# Patient Record
Sex: Female | Born: 1950 | Race: White | Hispanic: No | Marital: Married | Smoking: Never smoker
Health system: Southern US, Community
[De-identification: ages and names within clinical notes are randomized; demographics above are authoritative.]

## PROBLEM LIST (undated history)

## (undated) DIAGNOSIS — G894 Chronic pain syndrome: Secondary | ICD-10-CM

## (undated) DIAGNOSIS — M4800 Spinal stenosis, site unspecified: Secondary | ICD-10-CM

## (undated) DIAGNOSIS — M545 Low back pain, unspecified: Secondary | ICD-10-CM

## (undated) DIAGNOSIS — I10 Essential (primary) hypertension: Secondary | ICD-10-CM

## (undated) DIAGNOSIS — E785 Hyperlipidemia, unspecified: Secondary | ICD-10-CM

## (undated) HISTORY — DX: Hyperlipidemia, unspecified: E78.5

## (undated) HISTORY — DX: Chronic pain syndrome: G89.4

## (undated) HISTORY — DX: Low back pain: M54.5

## (undated) HISTORY — DX: Spinal stenosis, site unspecified: M48.00

## (undated) HISTORY — DX: Low back pain, unspecified: M54.50

## (undated) HISTORY — DX: Essential (primary) hypertension: I10

## (undated) HISTORY — PX: TUBAL LIGATION: SHX77

---

## 2004-12-08 ENCOUNTER — Ambulatory Visit: Payer: Self-pay | Admitting: Internal Medicine

## 2005-07-23 ENCOUNTER — Other Ambulatory Visit: Payer: Self-pay

## 2005-07-23 ENCOUNTER — Emergency Department: Payer: Self-pay | Admitting: Emergency Medicine

## 2011-03-22 ENCOUNTER — Ambulatory Visit: Payer: Self-pay | Admitting: Nephrology

## 2012-04-03 ENCOUNTER — Ambulatory Visit: Payer: Self-pay | Admitting: Family Medicine

## 2012-10-18 ENCOUNTER — Ambulatory Visit: Payer: Self-pay

## 2013-01-20 ENCOUNTER — Ambulatory Visit: Payer: Self-pay

## 2013-03-11 DIAGNOSIS — M5126 Other intervertebral disc displacement, lumbar region: Secondary | ICD-10-CM | POA: Insufficient documentation

## 2013-05-08 ENCOUNTER — Ambulatory Visit: Payer: Self-pay

## 2013-12-25 ENCOUNTER — Ambulatory Visit: Payer: Self-pay | Admitting: Unknown Physician Specialty

## 2014-04-16 ENCOUNTER — Ambulatory Visit: Payer: Self-pay

## 2016-07-14 LAB — HM DIABETES EYE EXAM

## 2016-08-30 DIAGNOSIS — E782 Mixed hyperlipidemia: Secondary | ICD-10-CM

## 2016-08-30 DIAGNOSIS — M4800 Spinal stenosis, site unspecified: Secondary | ICD-10-CM

## 2016-08-30 DIAGNOSIS — I10 Essential (primary) hypertension: Secondary | ICD-10-CM | POA: Insufficient documentation

## 2016-08-30 DIAGNOSIS — M545 Low back pain, unspecified: Secondary | ICD-10-CM | POA: Insufficient documentation

## 2016-08-30 DIAGNOSIS — E785 Hyperlipidemia, unspecified: Secondary | ICD-10-CM | POA: Insufficient documentation

## 2016-08-30 DIAGNOSIS — G894 Chronic pain syndrome: Secondary | ICD-10-CM | POA: Insufficient documentation

## 2016-09-12 ENCOUNTER — Ambulatory Visit (INDEPENDENT_AMBULATORY_CARE_PROVIDER_SITE_OTHER): Payer: Managed Care, Other (non HMO) | Admitting: Unknown Physician Specialty

## 2016-09-12 ENCOUNTER — Encounter: Payer: Self-pay | Admitting: Unknown Physician Specialty

## 2016-09-12 VITALS — BP 123/86 | HR 70 | Temp 98.3°F | Ht 63.7 in | Wt 228.6 lb

## 2016-09-12 DIAGNOSIS — Z23 Encounter for immunization: Secondary | ICD-10-CM | POA: Diagnosis not present

## 2016-09-12 DIAGNOSIS — E782 Mixed hyperlipidemia: Secondary | ICD-10-CM | POA: Diagnosis not present

## 2016-09-12 DIAGNOSIS — E6609 Other obesity due to excess calories: Secondary | ICD-10-CM | POA: Diagnosis not present

## 2016-09-12 DIAGNOSIS — M545 Low back pain: Secondary | ICD-10-CM

## 2016-09-12 DIAGNOSIS — Z0000 Encounter for general adult medical examination without abnormal findings: Secondary | ICD-10-CM

## 2016-09-12 DIAGNOSIS — Z6839 Body mass index (BMI) 39.0-39.9, adult: Secondary | ICD-10-CM | POA: Diagnosis not present

## 2016-09-12 DIAGNOSIS — R7301 Impaired fasting glucose: Secondary | ICD-10-CM

## 2016-09-12 DIAGNOSIS — I10 Essential (primary) hypertension: Secondary | ICD-10-CM

## 2016-09-12 DIAGNOSIS — M4800 Spinal stenosis, site unspecified: Secondary | ICD-10-CM | POA: Diagnosis not present

## 2016-09-12 LAB — BAYER DCA HB A1C WAIVED: HB A1C (BAYER DCA - WAIVED): 6 % (ref <7.0)

## 2016-09-12 MED ORDER — CYCLOBENZAPRINE HCL 10 MG PO TABS: 10.0000 mg | ORAL_TABLET | Freq: Three times a day (TID) | ORAL | 3 refills | Status: DC | PRN

## 2016-09-12 MED ORDER — ATORVASTATIN CALCIUM 20 MG PO TABS: 20.0000 mg | ORAL_TABLET | Freq: Every day | ORAL | 1 refills | Status: DC

## 2016-09-12 MED ORDER — MELOXICAM 15 MG PO TABS: 15.0000 mg | ORAL_TABLET | Freq: Every day | ORAL | 2 refills | Status: DC

## 2016-09-12 MED ORDER — GABAPENTIN 300 MG PO CAPS: 300.0000 mg | ORAL_CAPSULE | Freq: Three times a day (TID) | ORAL | 2 refills | Status: DC

## 2016-09-12 NOTE — Assessment & Plan Note (Addendum)
Stable, continue present medications.  Encourage to start exercising

## 2016-09-12 NOTE — Assessment & Plan Note (Signed)
Discussed with pt to work on diet low on sugar and processed food and exercise.

## 2016-09-12 NOTE — Patient Instructions (Addendum)
Influenza (Flu) Vaccine (Inactivated or Recombinant): What You Need to Know 1. Why get vaccinated? Influenza ("flu") is a contagious disease that spreads around the [place] every year, usually between October and May. Flu is caused by influenza viruses, and is spread mainly by coughing, sneezing, and close contact. Anyone can get flu. Flu strikes suddenly and can last several days. Symptoms vary by age, but can include:  fever/chills  sore throat  muscle aches  fatigue  cough  headache  runny or stuffy nose Flu can also lead to pneumonia and blood infections, and cause diarrhea and seizures in children. If you have a medical condition, such as heart or lung disease, flu can make it worse. Flu is more dangerous for some people. Infants and young children, people 65 years of age and older, pregnant women, and people with certain health conditions or a weakened immune system are at greatest risk. Each year thousands of people in the [place] die from flu, and many more are hospitalized. Flu vaccine can:  keep you from getting flu,  make flu less severe if you do get it, and  keep you from spreading flu to your family and other people. 2. Inactivated and recombinant flu vaccines A dose of flu vaccine is recommended every flu season. Children 6 months through 8 years of age may need two doses during the same flu season. Everyone else needs only one dose each flu season. Some inactivated flu vaccines contain a very small amount of a mercury-based preservative called thimerosal. Studies have not shown thimerosal in vaccines to be harmful, but flu vaccines that do not contain thimerosal are available. There is no live flu virus in flu shots. They cannot cause the flu. There are many flu viruses, and they are always changing. Each year a new flu vaccine is made to protect against three or four viruses that are likely to cause disease in the upcoming flu season. But even when the  vaccine doesn't exactly match these viruses, it may still provide some protection. Flu vaccine cannot prevent:  flu that is caused by a virus not covered by the vaccine, or  illnesses that look like flu but are not. It takes about 2 weeks for protection to develop after vaccination, and protection lasts through the flu season. 3. Some people should not get this vaccine Tell the person who is giving you the vaccine:  If you have any severe, life-threatening allergies. If you ever had a life-threatening allergic reaction after a dose of flu vaccine, or have a severe allergy to any part of this vaccine, you may be advised not to get vaccinated. Most, but not all, types of flu vaccine contain a small amount of egg protein.  If you ever had Guillain-Barr Syndrome (also called GBS). Some people with a history of GBS should not get this vaccine. This should be discussed with your doctor.  If you are not feeling well. It is usually okay to get flu vaccine when you have a mild illness, but you might be asked to come back when you feel better. 4. Risks of a vaccine reaction With any medicine, including vaccines, there is a chance of reactions. These are usually mild and go away on their own, but serious reactions are also possible. Most people who get a flu shot do not have any problems with it. Minor problems following a flu shot include:  soreness, redness, or swelling where the shot was given  hoarseness  sore, red or itchy   eyes  cough  fever  aches  headache  itching  fatigue If these problems occur, they usually begin soon after the shot and last 1 or 2 days. More serious problems following a flu shot can include the following:  There may be a small increased risk of Guillain-Barre Syndrome (GBS) after inactivated flu vaccine. This risk has been estimated at 1 or 2 additional cases per million people vaccinated. This is much lower than the risk of severe complications from flu,  which can be prevented by flu vaccine.  Young children who get the flu shot along with pneumococcal vaccine (PCV13) and/or DTaP vaccine at the same time might be slightly more likely to have a seizure caused by fever. Ask your doctor for more information. Tell your doctor if a child who is getting flu vaccine has ever had a seizure. Problems that could happen after any injected vaccine:  People sometimes faint after a medical procedure, including vaccination. Sitting or lying down for about 15 minutes can help prevent fainting, and injuries caused by a fall. Tell your doctor if you feel dizzy, or have vision changes or ringing in the ears.  Some people get severe pain in the shoulder and have difficulty moving the arm where a shot was given. This happens very rarely.  Any medication can cause a severe allergic reaction. Such reactions from a vaccine are very rare, estimated at about 1 in a million doses, and would happen within a few minutes to a few hours after the vaccination. As with any medicine, there is a very remote chance of a vaccine causing a serious injury or death. The safety of vaccines is always being monitored. For more information, visit: www.cdc.gov/vaccinesafety/ 5. What if there is a serious reaction? What should I look for? Look for anything that concerns you, such as signs of a severe allergic reaction, very high fever, or unusual behavior. Signs of a severe allergic reaction can include hives, swelling of the face and throat, difficulty breathing, a fast heartbeat, dizziness, and weakness. These would start a few minutes to a few hours after the vaccination. What should I do?  If you think it is a severe allergic reaction or other emergency that can't wait, call 9-1-1 and get the person to the nearest hospital. Otherwise, call your doctor.  Reactions should be reported to the Vaccine Adverse Event Reporting System (VAERS). Your doctor should file this report, or you can do  it yourself through the VAERS web site at www.vaers.hhs.gov, or by calling 1-800-822-7967.  VAERS does not give medical advice. 6. The National Vaccine Injury Compensation Program The National Vaccine Injury Compensation Program (VICP) is a federal program that was created to compensate people who may have been injured by certain vaccines. Persons who believe they may have been injured by a vaccine can learn about the program and about filing a claim by calling 1-800-338-2382 or visiting the VICP website at www.hrsa.gov/vaccinecompensation. There is a time limit to file a claim for compensation. 7. How can I learn more?  Ask your healthcare provider. He or she can give you the vaccine package insert or suggest other sources of information.  Call your local or state health department.  Contact the Centers for Disease Control and Prevention (CDC):  Call 1-800-232-4636 (1-800-CDC-INFO) or  Visit CDC's website at www.cdc.gov/flu Vaccine Information Statement, Inactivated Influenza Vaccine (05/01/2014) This information is not intended to replace advice given to you by your health care provider. Make sure you discuss any questions you   you discuss any questions you have with your health care provider. Document Released: 07/06/2006 Document Revised: 06/01/2016 Document Reviewed: 06/01/2016 Elsevier Interactive Patient Education  2017 [employer] Tdap Vaccine (Tetanus, Diphtheria and Pertussis): What You Need to Know 1. Why get vaccinated? Tetanus, diphtheria and pertussis are very serious diseases. Tdap vaccine can protect us from these diseases. And, Tdap vaccine given to pregnant women can protect newborn babies against pertussis. TETANUS (Lockjaw) is rare in the [place] today. It causes painful muscle tightening and stiffness, usually all over the body.  It can lead to tightening of muscles in the head and neck so you can't open your mouth, swallow, or sometimes even breathe. Tetanus kills about 1 out of 10  people who are infected even after receiving the best medical care.  DIPHTHERIA is also rare in the [place] today. It can cause a thick coating to form in the back of the throat.  It can lead to breathing problems, heart failure, paralysis, and death.  PERTUSSIS (Whooping Cough) causes severe coughing spells, which can cause difficulty breathing, vomiting and disturbed sleep.  It can also lead to weight loss, incontinence, and rib fractures. Up to 2 in 100 adolescents and 5 in 100 adults with pertussis are hospitalized or have complications, which could include pneumonia or death.  These diseases are caused by bacteria. Diphtheria and pertussis are spread from person to person through secretions from coughing or sneezing. Tetanus enters the body through cuts, scratches, or wounds. Before vaccines, as many as 200,000 cases of diphtheria, 200,000 cases of pertussis, and hundreds of cases of tetanus, were reported in the [place] each year. Since vaccination began, reports of cases for tetanus and diphtheria have dropped by about 99% and for pertussis by about 80%. 2. Tdap vaccine Tdap vaccine can protect adolescents and adults from tetanus, diphtheria, and pertussis. One dose of Tdap is routinely given at age 11 or 12. People who did not get Tdap at that age should get it as soon as possible. Tdap is especially important for healthcare professionals and anyone having close contact with a baby younger than 12 months. Pregnant women should get a dose of Tdap during every pregnancy, to protect the newborn from pertussis. Infants are most at risk for severe, life-threatening complications from pertussis. Another vaccine, called Td, protects against tetanus and diphtheria, but not pertussis. A Td booster should be given every 10 years. Tdap may be given as one of these boosters if you have never gotten Tdap before. Tdap may also be given after a severe cut or burn to prevent tetanus  infection. Your doctor or the person giving you the vaccine can give you more information. Tdap may safely be given at the same time as other vaccines. 3. Some people should not get this vaccine  A person who has ever had a life-threatening allergic reaction after a previous dose of any diphtheria, tetanus or pertussis containing vaccine, OR has a severe allergy to any part of this vaccine, should not get Tdap vaccine. Tell the person giving the vaccine about any severe allergies.  Anyone who had coma or long repeated seizures within 7 days after a childhood dose of DTP or DTaP, or a previous dose of Tdap, should not get Tdap, unless a cause other than the vaccine was found. They can still get Td.  Talk to your doctor if you: ? have seizures or another nervous system problem, ? had severe pain or swelling after any vaccine containing diphtheria,   tetanus or pertussis, ? ever had a condition called Guillain-Barr Syndrome (GBS), ? aren't feeling well on the day the shot is scheduled. 4. Risks With any medicine, including vaccines, there is a chance of side effects. These are usually mild and go away on their own. Serious reactions are also possible but are rare. Most people who get Tdap vaccine do not have any problems with it. Mild problems following Tdap: (Did not interfere with activities)  Pain where the shot was given (about 3 in 4 adolescents or 2 in 3 adults)  Redness or swelling where the shot was given (about 1 person in 5)  Mild fever of at least 100.4F (up to about 1 in 25 adolescents or 1 in 100 adults)  Headache (about 3 or 4 people in 10)  Tiredness (about 1 person in 3 or 4)  Nausea, vomiting, diarrhea, stomach ache (up to 1 in 4 adolescents or 1 in 10 adults)  Chills, sore joints (about 1 person in 10)  Body aches (about 1 person in 3 or 4)  Rash, swollen glands (uncommon)  Moderate problems following Tdap: (Interfered with activities, but did not require medical  attention)  Pain where the shot was given (up to 1 in 5 or 6)  Redness or swelling where the shot was given (up to about 1 in 16 adolescents or 1 in 12 adults)  Fever over 102F (about 1 in 100 adolescents or 1 in 250 adults)  Headache (about 1 in 7 adolescents or 1 in 10 adults)  Nausea, vomiting, diarrhea, stomach ache (up to 1 or 3 people in 100)  Swelling of the entire arm where the shot was given (up to about 1 in 500).  Severe problems following Tdap: (Unable to perform usual activities; required medical attention)  Swelling, severe pain, bleeding and redness in the arm where the shot was given (rare).  Problems that could happen after any vaccine:  People sometimes faint after a medical procedure, including vaccination. Sitting or lying down for about 15 minutes can help prevent fainting, and injuries caused by a fall. Tell your doctor if you feel dizzy, or have vision changes or ringing in the ears.  Some people get severe pain in the shoulder and have difficulty moving the arm where a shot was given. This happens very rarely.  Any medication can cause a severe allergic reaction. Such reactions from a vaccine are very rare, estimated at fewer than 1 in a million doses, and would happen within a few minutes to a few hours after the vaccination. As with any medicine, there is a very remote chance of a vaccine causing a serious injury or death. The safety of vaccines is always being monitored. For more information, visit: www.cdc.gov/vaccinesafety/ 5. What if there is a serious problem? What should I look for? Look for anything that concerns you, such as signs of a severe allergic reaction, very high fever, or unusual behavior. Signs of a severe allergic reaction can include hives, swelling of the face and throat, difficulty breathing, a fast heartbeat, dizziness, and weakness. These would usually start a few minutes to a few hours after the vaccination. What should I do?  If  you think it is a severe allergic reaction or other emergency that can't wait, call 9-1-1 or get the person to the nearest hospital. Otherwise, call your doctor.  Afterward, the reaction should be reported to the Vaccine Adverse Event Reporting System (VAERS). Your doctor might file this report, or you can do   calling [phone].  VAERS does not give medical advice. 6. The National Vaccine Injury Compensation Program The [employer] Vaccine Injury Compensation Program (VICP) is a federal program that was created to compensate people who may have been injured by certain vaccines. Persons who believe they may have been injured by a vaccine can learn about the program and about filing a claim by calling 918 150 5647 or visiting the [place] website at GoldCloset.com.ee. There is a time limit to file a claim for compensation. 7. How can I learn more?  Ask your doctor. He or she can give you the vaccine package insert or suggest other sources of information.  Call your local or state health department.  Contact the Centers for Disease Control and Prevention (CDC):  Call (769)704-7908 (1-800-CDC-INFO) or  Visit CDC's website at http://hunter.com/ CDC Tdap Vaccine VIS (11/18/13) This information is not intended to replace advice given to you by your health care provider. Make sure you discuss any questions you have with your health ca ------------------------------------------------------------------------------  Please do call to schedule your mammogram; the number to schedule one at either [facility] or [facility] is [phone]

## 2016-09-12 NOTE — Progress Notes (Signed)
BP 123/86 (BP Location: Left Arm, Patient Position: Sitting, Cuff Size: Large)   Pulse 70   Temp 98.3 F (36.8 C)   Ht 5' 3.7" (1.618 m)   Wt 228 lb 9.6 oz (103.7 kg)   LMP  (LMP Unknown)   SpO2 91%   BMI 39.61 kg/m    Subjective:    Patient ID: Kiara Parker, female    DOB: 1951/01/21, 65 y.o.   MRN: [id number]  HPI: Kiara Parker is a 65 y.o. female  Chief Complaint  Patient presents with  . Diabetes    pt states she would like to be checked for diabetes, states she has been borderline in the past  . Orders    pt states she would like to have an order for a mammogram   . 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    Hyperlipidemia Takes Lipitor on occasion.  Had a stress test through cardiology Add to muscle aches she already has Diet compliance/Exercise: Not good at the moment.    Spinal Stenosis States she has low back pain that is worse with standing.  Describes an aching pain.  Takes Cyclobenzaprine about 4 times/week, Gabapentin once or twice a day, and Meloxicam during a flare.  She has seen a neurosurgery.  Diabetes Concern with diabetes as borderline in the past.  States she is having some feet swelling and tingling in her feet.  "I have a terrible sweet tooth."  She has gained more weight in the last several years.    Relevant past medical, surgical, family and social history reviewed and updated as indicated. Interim medical history since our last visit reviewed. Allergies and medications reviewed and updated.  Review of Systems  Per HPI unless specifically indicated above     Objective:    BP 123/86 (BP Location: Left Arm, Patient Position: Sitting, Cuff Size: Large)   Pulse 70   Temp 98.3 F (36.8 C)   Ht 5' 3.7" (1.618 m)   Wt 228 lb 9.6 oz (103.7 kg)    LMP  (LMP Unknown)   SpO2 91%   BMI 39.61 kg/m   Wt Readings from Last 3 Encounters:  09/12/16 228 lb 9.6 oz (103.7 kg)  03/25/14 215 lb (97.5 kg)    Physical Exam  Constitutional: She is oriented to person, place, and time. She appears well-developed and well-nourished. No distress.  HENT:  Head: Normocephalic and atraumatic.  Eyes: Conjunctivae and lids are normal. Right eye exhibits no discharge. Left eye exhibits no discharge. No scleral icterus.  Neck: Normal range of motion. Neck supple. No JVD present. Carotid bruit is not present.  Cardiovascular: Normal rate, regular rhythm and normal heart sounds.   Pulmonary/Chest: Effort normal and breath sounds normal. Right breast exhibits no inverted nipple, no mass, no nipple discharge, no skin change and no tenderness. Left breast exhibits no inverted nipple, no mass, no nipple discharge, no skin change and no tenderness. Breasts are symmetrical.  Abdominal: Normal appearance. There is no splenomegaly or hepatomegaly.  Musculoskeletal: Normal range of motion.  Neurological: She is alert and oriented to person, place, and time.  Skin: Skin is warm, dry and intact. No rash noted. No pallor.  Psychiatric: She has a normal mood and affect. Her behavior is normal. Judgment  and thought content normal.    Results for orders placed or performed in visit on 07/21/16  HM DIABETES EYE EXAM  Result Value Ref Range   HM Diabetic Eye Exam No Retinopathy No Retinopathy      Assessment & Plan:   Problem List Items Addressed This Visit      Unprioritized   Hyperlipidemia   Relevant Medications   atorvastatin (LIPITOR) 20 MG tablet   Other Relevant Orders   Comprehensive metabolic panel   Lipid Panel w/o Chol/HDL Ratio   Hypertension   Relevant Medications   atorvastatin (LIPITOR) 20 MG tablet   IFG (impaired fasting glucose)    Discussed with pt to work on diet low on sugar and processed food and exercise.        Relevant Orders    Bayer DCA Hb A1c Waived   Lumbago   Relevant Medications   cyclobenzaprine (FLEXERIL) 10 MG tablet   meloxicam (MOBIC) 15 MG tablet   Spinal stenosis    Stable, continue present medications.  Encourage to start exercising        Other Visit Diagnoses    Need for influenza vaccination    -  Primary   Relevant Orders   Flu Vaccine QUAD 36+ mos IM (Completed)   Need for diphtheria-tetanus-pertussis (Tdap) vaccine, adult/adolescent       Relevant Orders   Tdap vaccine greater than or equal to 7yo IM (Completed)   Routine general medical examination at a health care facility       Relevant Orders   MM DIGITAL SCREENING BILATERAL   Comprehensive metabolic panel   TSH   CBC with Differential/Platelet   Class 2 obesity due to excess calories without serious comorbidity with body mass index (BMI) of 39.0 to 39.9 in adult       Relevant Orders   Bayer DCA Hb A1c Waived      Note pt is fasting this AM  Follow up plan: Return in about 6 months (around 03/13/2017) for physical.

## 2016-09-13 ENCOUNTER — Encounter: Payer: Self-pay | Admitting: Unknown Physician Specialty

## 2016-09-13 LAB — CBC WITH DIFFERENTIAL/PLATELET
Basophils Absolute: 0 10*3/uL (ref 0.0–0.2)
Basos: 0 %
EOS (ABSOLUTE): 0.2 10*3/uL (ref 0.0–0.4)
Eos: 4 %
Hematocrit: 41.3 % (ref 34.0–46.6)
Hemoglobin: 13.8 g/dL (ref 11.1–15.9)
Immature Grans (Abs): 0 10*3/uL (ref 0.0–0.1)
Immature Granulocytes: 0 %
Lymphocytes Absolute: 1.8 10*3/uL (ref 0.7–3.1)
Lymphs: 32 %
MCH: 29.7 pg (ref 26.6–33.0)
MCHC: 33.4 g/dL (ref 31.5–35.7)
MCV: 89 fL (ref 79–97)
Monocytes Absolute: 0.4 10*3/uL (ref 0.1–0.9)
Monocytes: 7 %
Neutrophils Absolute: 3.3 10*3/uL (ref 1.4–7.0)
Neutrophils: 57 %
Platelets: 277 10*3/uL (ref 150–379)
RBC: 4.65 x10E6/uL (ref 3.77–5.28)
RDW: 13.3 % (ref 12.3–15.4)
WBC: 5.8 10*3/uL (ref 3.4–10.8)

## 2016-09-13 LAB — COMPREHENSIVE METABOLIC PANEL
ALT: 35 IU/L — ABNORMAL HIGH (ref 0–32)
AST: 28 IU/L (ref 0–40)
Albumin/Globulin Ratio: 1.5 (ref 1.2–2.2)
Albumin: 3.8 g/dL (ref 3.6–4.8)
Alkaline Phosphatase: 73 IU/L (ref 39–117)
BUN/Creatinine Ratio: 9 — ABNORMAL LOW (ref 12–28)
BUN: 8 mg/dL (ref 8–27)
Bilirubin Total: 0.5 mg/dL (ref 0.0–1.2)
CO2: 24 mmol/L (ref 18–29)
Calcium: 8.9 mg/dL (ref 8.7–10.3)
Chloride: 102 mmol/L (ref 96–106)
Creatinine, Ser: 0.86 mg/dL (ref 0.57–1.00)
GFR calc Af Amer: 83 mL/min/{1.73_m2} (ref >59)
GFR calc non Af Amer: 72 mL/min/{1.73_m2} (ref >59)
Globulin, Total: 2.6 g/dL (ref 1.5–4.5)
Glucose: 102 mg/dL — ABNORMAL HIGH (ref 65–99)
Potassium: 4.6 mmol/L (ref 3.5–5.2)
Sodium: 141 mmol/L (ref 134–144)
Total Protein: 6.4 g/dL (ref 6.0–8.5)

## 2016-09-13 LAB — TSH: TSH: 0.924 u[IU]/mL (ref 0.450–4.500)

## 2016-09-13 LAB — LIPID PANEL W/O CHOL/HDL RATIO
Cholesterol, Total: 207 mg/dL — ABNORMAL HIGH (ref 100–199)
HDL: 49 mg/dL (ref >39)
LDL Calculated: 130 mg/dL — ABNORMAL HIGH (ref 0–99)
Triglycerides: 140 mg/dL (ref 0–149)
VLDL Cholesterol Cal: 28 mg/dL (ref 5–40)

## 2016-10-13 ENCOUNTER — Ambulatory Visit
Admission: RE | Admit: 2016-10-13 | Discharge: 2016-10-13 | Disposition: A | Discharge status: Home / Self Care | Payer: Commercial Managed Care - PPO | Source: Ambulatory Visit | Attending: Unknown Physician Specialty | Admitting: Unknown Physician Specialty

## 2016-10-13 DIAGNOSIS — Z1231 Encounter for screening mammogram for malignant neoplasm of breast: Secondary | ICD-10-CM | POA: Diagnosis not present

## 2016-10-13 DIAGNOSIS — Z0000 Encounter for general adult medical examination without abnormal findings: Secondary | ICD-10-CM

## 2016-11-13 ENCOUNTER — Emergency Department
Admission: EM | Admit: 2016-11-13 | Discharge: 2016-11-13 | Disposition: A | Discharge status: Home / Self Care | Payer: Commercial Managed Care - PPO | Attending: Emergency Medicine | Admitting: Emergency Medicine

## 2016-11-13 ENCOUNTER — Encounter: Payer: Self-pay | Admitting: Emergency Medicine

## 2016-11-13 DIAGNOSIS — S199XXA Unspecified injury of neck, initial encounter: Secondary | ICD-10-CM | POA: Diagnosis present

## 2016-11-13 DIAGNOSIS — M7918 Myalgia, other site: Secondary | ICD-10-CM

## 2016-11-13 DIAGNOSIS — R51 Headache: Secondary | ICD-10-CM | POA: Insufficient documentation

## 2016-11-13 DIAGNOSIS — M542 Cervicalgia: Secondary | ICD-10-CM | POA: Diagnosis not present

## 2016-11-13 DIAGNOSIS — Y9389 Activity, other specified: Secondary | ICD-10-CM | POA: Insufficient documentation

## 2016-11-13 DIAGNOSIS — M25512 Pain in left shoulder: Secondary | ICD-10-CM | POA: Insufficient documentation

## 2016-11-13 DIAGNOSIS — Y92410 Unspecified street and highway as the place of occurrence of the external cause: Secondary | ICD-10-CM | POA: Insufficient documentation

## 2016-11-13 DIAGNOSIS — M25511 Pain in right shoulder: Secondary | ICD-10-CM | POA: Diagnosis not present

## 2016-11-13 DIAGNOSIS — Z79899 Other long term (current) drug therapy: Secondary | ICD-10-CM | POA: Diagnosis not present

## 2016-11-13 DIAGNOSIS — I10 Essential (primary) hypertension: Secondary | ICD-10-CM | POA: Diagnosis not present

## 2016-11-13 DIAGNOSIS — G8929 Other chronic pain: Secondary | ICD-10-CM | POA: Diagnosis not present

## 2016-11-13 DIAGNOSIS — Y999 Unspecified external cause status: Secondary | ICD-10-CM | POA: Diagnosis not present

## 2016-11-13 MED ORDER — HYDROCODONE-ACETAMINOPHEN 5-325 MG PO TABS
1.0000 | ORAL_TABLET | Freq: Once | ORAL | Status: AC
  Administered 2016-11-13: 1 via ORAL
  Filled 2016-11-13: qty 1

## 2016-11-13 MED ORDER — HYDROCODONE-ACETAMINOPHEN 5-325 MG PO TABS: 1.0000 | ORAL_TABLET | ORAL | 0 refills | Status: DC | PRN
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
## 2016-11-13 NOTE — Discharge Instructions (Signed)
Follow up with your primary care doctor if any continued problems or [facility] acute-care. Continue taking meloxicam as directed by your doctor. Norco as needed for pain. This medication could cause drowsiness increase your risk for falling. Do not drive and take this medication. You may also use ice or heat to muscles as needed for comfort.

## 2016-12-09 ENCOUNTER — Other Ambulatory Visit: Payer: Self-pay | Admitting: Unknown Physician Specialty

## 2017-01-15 ENCOUNTER — Telehealth: Payer: Self-pay | Admitting: Unknown Physician Specialty

## 2017-01-15 NOTE — Telephone Encounter (Signed)
Patient returned my call. She asked when her last pap was and I told patient that it was 10/14/13. Patient stated that she has had some recent vaginal bleeding and states that one of her sisters recently passed away from cancer so she is very concerned. Scheduled patient an appointment to come in to see [facility] tomorrow.

## 2017-01-15 NOTE — Telephone Encounter (Signed)
Tried calling patient, phone rang a few times and then went to a busy signal. Will try to call again later. Negative PAP and negative HPV in [place] on last pap.

## 2017-01-16 ENCOUNTER — Encounter: Payer: Self-pay | Admitting: Unknown Physician Specialty

## 2017-01-16 ENCOUNTER — Ambulatory Visit (INDEPENDENT_AMBULATORY_CARE_PROVIDER_SITE_OTHER): Payer: Commercial Managed Care - PPO | Admitting: Unknown Physician Specialty

## 2017-01-16 VITALS — BP 129/81 | HR 83 | Temp 97.7°F | Wt 234.0 lb

## 2017-01-16 DIAGNOSIS — G4700 Insomnia, unspecified: Secondary | ICD-10-CM | POA: Insufficient documentation

## 2017-01-16 DIAGNOSIS — N950 Postmenopausal bleeding: Secondary | ICD-10-CM | POA: Diagnosis not present

## 2017-01-16 DIAGNOSIS — F5101 Primary insomnia: Secondary | ICD-10-CM

## 2017-01-16 MED ORDER — CYCLOBENZAPRINE HCL 10 MG PO TABS: 10.0000 mg | ORAL_TABLET | Freq: Every day | ORAL | 3 refills | Status: DC

## 2017-01-16 NOTE — Assessment & Plan Note (Signed)
Uses occasional Cyclobenzeprine.  Discussed CBT for sleep

## 2017-01-16 NOTE — Progress Notes (Signed)
BP 129/81 (BP Location: Left Arm, Cuff Size: Large)   Pulse 83   Temp 97.7 F (36.5 C)   Wt 234 lb (106.1 kg)   LMP  (LMP Unknown)   SpO2 93%   BMI 40.17 kg/m    Subjective:    Patient ID: Kiara Parker, female    DOB: 05/28/1951, 66 y.o.   MRN: [id number]  HPI: Kiara Parker is a 66 y.o. female  Chief Complaint  Patient presents with  . Vaginal Bleeding    pt states she has had some spotting twice within the last 3 weeks   . Medication Refill    pt states she would like a refill on cyclobenazeprine   Vaginal bleeding As noted above.  She has had spotting x2 in the last 3 weeks.  No cramps.  Her sister died of lymphoma and wants to have everything checked out.    Back pain Pt would like a refill of Cyclobenzeprine she uses once or twice a week.  States it is primarily to relax her and help her fall asleep.     Relevant past medical, surgical, family and social history reviewed and updated as indicated. Interim medical history since our last visit reviewed. Allergies and medications reviewed and updated.  Review of Systems  Per HPI unless specifically indicated above     Objective:    BP 129/81 (BP Location: Left Arm, Cuff Size: Large)   Pulse 83   Temp 97.7 F (36.5 C)   Wt 234 lb (106.1 kg)   LMP  (LMP Unknown)   SpO2 93%   BMI 40.17 kg/m   Wt Readings from Last 3 Encounters:  01/16/17 234 lb (106.1 kg)  11/13/16 220 lb (99.8 kg)  09/12/16 228 lb 9.6 oz (103.7 kg)    Physical Exam  Constitutional: She is oriented to person, place, and time. She appears well-developed and well-nourished. No distress.  HENT:  Head: Normocephalic and atraumatic.  Eyes: Conjunctivae and lids are normal. Right eye exhibits no discharge. Left eye exhibits no discharge. No scleral icterus.  Neck: Normal range of motion. Neck supple. No JVD present. Carotid bruit is not present.  Cardiovascular: Normal rate, regular rhythm and normal heart sounds.   Pulmonary/Chest:  Effort normal and breath sounds normal.  Abdominal: Normal appearance. There is no splenomegaly or hepatomegaly.  Musculoskeletal: Normal range of motion.  Neurological: She is alert and oriented to person, place, and time.  Skin: Skin is warm, dry and intact. No rash noted. No pallor.  Psychiatric: She has a normal mood and affect. Her behavior is normal. Judgment and thought content normal.    Results for orders placed or performed in visit on 09/12/16  Comprehensive metabolic panel  Result Value Ref Range   Glucose 102 (H) 65 - 99 mg/dL   BUN 8 8 - 27 mg/dL   Creatinine, Ser 0.86 0.57 - 1.00 mg/dL   GFR calc non Af Amer 72 >59 mL/min/1.73   GFR calc Af Amer 83 >59 mL/min/1.73   BUN/Creatinine Ratio 9 (L) 12 - 28   Sodium 141 134 - 144 mmol/L   Potassium 4.6 3.5 - 5.2 mmol/L   Chloride 102 96 - 106 mmol/L   CO2 24 18 - 29 mmol/L   Calcium 8.9 8.7 - 10.3 mg/dL   Total Protein 6.4 6.0 - 8.5 g/dL   Albumin 3.8 3.6 - 4.8 g/dL   Globulin, Total 2.6 1.5 - 4.5 g/dL   Albumin/Globulin Ratio 1.5 1.2 -  2.2   Bilirubin Total 0.5 0.0 - 1.2 mg/dL   Alkaline Phosphatase 73 39 - 117 IU/L   AST 28 0 - 40 IU/L   ALT 35 (H) 0 - 32 IU/L  Lipid Panel w/o Chol/HDL Ratio  Result Value Ref Range   Cholesterol, Total 207 (H) 100 - 199 mg/dL   Triglycerides 140 0 - 149 mg/dL   HDL 49 >39 mg/dL   VLDL Cholesterol Cal 28 5 - 40 mg/dL   LDL Calculated 130 (H) 0 - 99 mg/dL  TSH  Result Value Ref Range   TSH 0.924 0.450 - 4.500 uIU/mL  CBC with Differential/Platelet  Result Value Ref Range   WBC 5.8 3.4 - 10.8 x10E3/uL   RBC 4.65 3.77 - 5.28 x10E6/uL   Hemoglobin 13.8 11.1 - 15.9 g/dL   Hematocrit 41.3 34.0 - 46.6 %   MCV 89 79 - 97 fL   MCH 29.7 26.6 - 33.0 pg   MCHC 33.4 31.5 - 35.7 g/dL   RDW 13.3 12.3 - 15.4 %   Platelets 277 150 - 379 x10E3/uL   Neutrophils 57 Not Estab. %   Lymphs 32 Not Estab. %   Monocytes 7 Not Estab. %   Eos 4 Not Estab. %   Basos 0 Not Estab. %   Neutrophils  Absolute 3.3 1.4 - 7.0 x10E3/uL   Lymphocytes Absolute 1.8 0.7 - 3.1 x10E3/uL   Monocytes Absolute 0.4 0.1 - 0.9 x10E3/uL   EOS (ABSOLUTE) 0.2 0.0 - 0.4 x10E3/uL   Basophils Absolute 0.0 0.0 - 0.2 x10E3/uL   Immature Granulocytes 0 Not Estab. %   Immature Grans (Abs) 0.0 0.0 - 0.1 x10E3/uL  Bayer DCA Hb A1c Waived  Result Value Ref Range   Bayer DCA Hb A1c Waived 6.0 <7.0 %      Assessment & Plan:   Problem List Items Addressed This Visit      Unprioritized   Insomnia    Uses occasional Cyclobenzeprine.  Discussed CBT for sleep       Other Visit Diagnoses    Postmenopausal vaginal bleeding    -  Primary   Refer to gyn for further work-up   Relevant Orders   Ambulatory referral to Gynecology       Follow up plan: Return for June for physical already scheduled.

## 2017-01-16 NOTE — Patient Instructions (Addendum)
Cognitive behavioral therapy for sleep [email or web address]  Insomnia Insomnia is a sleep disorder that makes it difficult to fall asleep or to stay asleep. Insomnia can cause tiredness (fatigue), low energy, difficulty concentrating, mood swings, and poor performance at work or school. There are three different ways to classify insomnia:  Difficulty falling asleep.  Difficulty staying asleep.  Waking up too early in the morning. Any type of insomnia can be long-term (chronic) or short-term (acute). Both are common. Short-term insomnia usually lasts for three months or less. Chronic insomnia occurs at least three times a week for longer than three months. What are the causes? Insomnia may be caused by another condition, situation, or substance, such as:  Anxiety.  Certain medicines.  Gastroesophageal reflux disease (GERD) or other gastrointestinal conditions.  Asthma or other breathing conditions.  Restless legs syndrome, sleep apnea, or other sleep disorders.  Chronic pain.  Menopause. This may include hot flashes.  Stroke.  Abuse of alcohol, tobacco, or illegal drugs.  Depression.  Caffeine.  Neurological disorders, such as Alzheimer disease.  An overactive thyroid (hyperthyroidism). The cause of insomnia may not be known. What increases the risk? Risk factors for insomnia include:  Gender. Women are more commonly affected than men.  Age. Insomnia is more common as you get older.  Stress. This may involve your professional or personal life.  Income. Insomnia is more common in people with lower income.  Lack of exercise.  Irregular work schedule or night shifts.  Traveling between different time zones. What are the signs or symptoms? If you have insomnia, trouble falling asleep or trouble staying asleep is the main symptom. This may lead to other symptoms, such as:  Feeling fatigued.  Feeling nervous about going to sleep.  Not feeling rested in the  morning.  Having trouble concentrating.  Feeling irritable, anxious, or depressed. How is this treated? Treatment for insomnia depends on the cause. If your insomnia is caused by an underlying condition, treatment will focus on addressing the condition. Treatment may also include:  Medicines to help you sleep.  Counseling or therapy.  Lifestyle adjustments. Follow these instructions at home:  Take medicines only as directed by your health care provider.  Keep regular sleeping and waking hours. Avoid naps.  Keep a sleep diary to help you and your health care provider figure out what could be causing your insomnia. Include:  When you sleep.  When you wake up during the night.  How well you sleep.  How rested you feel the next day.  Any side effects of medicines you are taking.  What you eat and drink.  Make your bedroom a comfortable place where it is easy to fall asleep:  Put up shades or special blackout curtains to block light from outside.  Use a white noise machine to block noise.  Keep the temperature cool.  Exercise regularly as directed by your health care provider. Avoid exercising right before bedtime.  Use relaxation techniques to manage stress. Ask your health care provider to suggest some techniques that may work well for you. These may include:  Breathing exercises.  Routines to release muscle tension.  Visualizing peaceful scenes.  Cut back on alcohol, caffeinated beverages, and cigarettes, especially close to bedtime. These can disrupt your sleep.  Do not overeat or eat spicy foods right before bedtime. This can lead to digestive discomfort that can make it hard for you to sleep.  Limit screen use before bedtime. This includes:  Watching TV.  Using your  smartphone, tablet, and computer.  Stick to a routine. This can help you fall asleep faster. Try to do a quiet activity, brush your teeth, and go to bed at the same time each night.  Get out  of bed if you are still awake after 15 minutes of trying to sleep. Keep the lights down, but try reading or doing a quiet activity. When you feel sleepy, go back to bed.  Make sure that you drive carefully. Avoid driving if you feel very sleepy.  Keep all follow-up appointments as directed by your health care provider. This is important. Contact a health care provider if:  You are tired throughout the day or have trouble in your daily routine due to sleepiness.  You continue to have sleep problems or your sleep problems get worse. Get help right away if:  You have serious thoughts about hurting yourself or someone else. This information is not intended to replace advice given to you by your health care provider. Make sure you discuss any questions you have with your health care provider. Document Released: 09/08/2000 Document Revised: 02/11/2016 Document Reviewed: 06/12/2014 Elsevier Interactive Patient Education  2017 [employer].

## 2017-01-22 ENCOUNTER — Ambulatory Visit (INDEPENDENT_AMBULATORY_CARE_PROVIDER_SITE_OTHER): Payer: Commercial Managed Care - PPO | Admitting: Obstetrics and Gynecology

## 2017-01-22 ENCOUNTER — Encounter: Payer: Self-pay | Admitting: Obstetrics and Gynecology

## 2017-01-22 VITALS — BP 138/79 | HR 96 | Ht 64.0 in | Wt 230.4 lb

## 2017-01-22 DIAGNOSIS — N8189 Other female genital prolapse: Secondary | ICD-10-CM | POA: Diagnosis not present

## 2017-01-22 DIAGNOSIS — N952 Postmenopausal atrophic vaginitis: Secondary | ICD-10-CM

## 2017-01-22 DIAGNOSIS — N950 Postmenopausal bleeding: Secondary | ICD-10-CM | POA: Diagnosis not present

## 2017-01-22 NOTE — Progress Notes (Signed)
HPI:      [name] is a 66 y.o. [phone] who LMP was No LMP recorded (lmp unknown). Patient is postmenopausal.  Subjective:   She presents today With complaint of 2 day history of light vaginal bleeding. Her vaginal bleeding has since stopped. This is her only vaginal bleeding since menopause approximately 10 years ago. Patient is sexually active-denies postcoital bleeding. Says her last Pap smear was within 3 years and her Paps have always been normal. She is not currently using any hormone replacement therapy. After speaking with her at some length, one for main concerns is that her sister recently died of lymphoma and her sister's daughter has lymphoma. According to this patient the diagnosis was initially "missed" for a while.  She is understandably concerned that if she has some type of cancer she does not want it to be "missed".    Hx: The following portions of the patient's history were reviewed and updated as appropriate:              She  has a past medical history of Chronic pain syndrome; Hyperlipidemia; Hypertension; Lumbago; and Spinal stenosis. She  does not have any pertinent problems on file. She  has a past surgical history that includes Tubal ligation. Her family history includes Arthritis in her mother; Breast cancer in her mother; Cancer in her maternal grandmother, mother, and sister; Heart disease in her father; Lung disease in her father; Multiple sclerosis in her sister; Osteoporosis in her mother. She  reports that she has never smoked. She has never used smokeless tobacco. She reports that she does not drink alcohol or use drugs. Current Outpatient Prescriptions on File Prior to Visit  Medication Sig Dispense Refill  . atorvastatin (LIPITOR) 20 MG tablet Take 1 tablet (20 mg total) by mouth daily. 90 tablet 1  . cyclobenzaprine (FLEXERIL) 10 MG tablet Take 1 tablet (10 mg total) by mouth at bedtime. 30 tablet 3  . gabapentin (NEURONTIN) 300 MG capsule TAKE 1  CAPSULE (300 MG TOTAL) BY MOUTH 3 (THREE) TIMES DAILY. 90 capsule 2  . meloxicam (MOBIC) 15 MG tablet TAKE 1 TABLET (15 MG TOTAL) BY MOUTH DAILY. 30 tablet 2   No current facility-administered medications on file prior to visit.          Review of Systems:  Review of Systems  Constitutional: Denied constitutional symptoms, night sweats, recent illness, fatigue, fever, insomnia and weight loss.  Eyes: Denied eye symptoms, eye pain, photophobia, vision change and visual disturbance.  Ears/Nose/Throat/Neck: Denied ear, nose, throat or neck symptoms, hearing loss, nasal discharge, sinus congestion and sore throat.  Cardiovascular: Denied cardiovascular symptoms, arrhythmia, chest pain/pressure, edema, exercise intolerance, orthopnea and palpitations.  Respiratory: Denied pulmonary symptoms, asthma, pleuritic pain, productive sputum, cough, dyspnea and wheezing.  Gastrointestinal: Denied, gastro-esophageal reflux, melena, nausea and vomiting.  Genitourinary: See HPI for additional information.  Musculoskeletal: Denied musculoskeletal symptoms, stiffness, swelling, muscle weakness and myalgia.  Dermatologic: Denied dermatology symptoms, rash and scar.  Neurologic: Denied neurology symptoms, dizziness, headache, neck pain and syncope.  Psychiatric: Denied psychiatric symptoms, anxiety and depression.  Endocrine: Denied endocrine symptoms including hot flashes and night sweats.   Meds:   Current Outpatient Prescriptions on File Prior to Visit  Medication Sig Dispense Refill  . atorvastatin (LIPITOR) 20 MG tablet Take 1 tablet (20 mg total) by mouth daily. 90 tablet 1  . cyclobenzaprine (FLEXERIL) 10 MG tablet Take 1 tablet (10 mg total) by mouth at bedtime. 30 tablet 3  .  gabapentin (NEURONTIN) 300 MG capsule TAKE 1 CAPSULE (300 MG TOTAL) BY MOUTH 3 (THREE) TIMES DAILY. 90 capsule 2  . meloxicam (MOBIC) 15 MG tablet TAKE 1 TABLET (15 MG TOTAL) BY MOUTH DAILY. 30 tablet 2   No current  facility-administered medications on file prior to visit.     Objective:     Vitals:   01/22/17 1150  BP: 138/79  Pulse: 96              Physical examination   Pelvic:   Vulva: Normal appearance.  No lesions.  Vagina: No lesions or abnormalities noted. Atrophic   Support: Pelvic relaxation disorder. Third degree rectocele second degree cystocele   Urethra No masses tenderness or scarring.  Meatus Normal size without lesions or prolapse.  Cervix: Normal appearance.  No lesions.  Anus: Normal exam.  No lesions.  Perineum: Normal exam.  No lesions.        Bimanual   Uterus: Normal size.  Non-tender.  Mobile.  AV.  Adnexae: No masses.  Non-tender to palpation.  Cul-de-sac: Negative for abnormality.   Patient's bimanual examination limited by patient body habitus.  Assessment:    [id number] Patient Active Problem List   Diagnosis Date Noted  . Insomnia 01/16/2017  . IFG (impaired fasting glucose) 09/12/2016  . Hyperlipidemia   . Hypertension   . Chronic pain syndrome   . Lumbago   . Spinal stenosis      1. Postmenopausal bleeding   2. Pelvic relaxation   3. Vaginal atrophy     Postmenopausal bleeding likely due to vaginal atrophy. Plan to continue workup for endometrial abnormality. No evidence of cervical issues on speculum examination including cervical lesions or polyps.   Plan:            1.  Ultrasound for endometrial thickness  2.  Recommend continued surveillance with Pap smears as previously performed by family physician.  3.  Endometrial biopsy if necessary.  4.  Consider vaginal estrogen to thicken the vaginal walls if cancer/hyperplasia ruled out.  5.  We spent the vast majority of our time discussing lymphoma. I have checked into genetic testing for lymphoma and I understand it is not available. We have discussed possible CBC or even referral to rheumatology so that the patient's mind can be put at ease regarding this condition. I have told her I'm not a  specialist in hematology but no other no specific screening testing for lymphoma. I have urged her to follow up with her family physician for further information.(She is currently asymptomatic but is very worried because of her family history.)  6.  We have discussed cystocele and rectocele and the patient is relatively asymptomatic at this time. We discussed both surgical and use of pessary and if she has further problems from this we will discuss this at a future date. Orders No orders of the defined types were placed in this encounter.   No orders of the defined types were placed in this encounter.       F/U  No Follow-up on file. I spent 47 minutes with this patient of which greater than 50% was spent discussing postmenopausal bleeding workup and possibilities. Screening tests or genetic cancers. Specifically discussed lymphoma in great detail. Cystocele rectocele pelvic relaxation discussed. Possible use of vaginal hormones discussed. All patient's questions answered.  Finis Bud, M.D. 01/22/2017 1:07 PM

## 2017-01-25 ENCOUNTER — Ambulatory Visit (INDEPENDENT_AMBULATORY_CARE_PROVIDER_SITE_OTHER): Payer: Commercial Managed Care - PPO

## 2017-01-25 ENCOUNTER — Other Ambulatory Visit: Payer: Commercial Managed Care - PPO

## 2017-01-25 DIAGNOSIS — N950 Postmenopausal bleeding: Secondary | ICD-10-CM | POA: Diagnosis not present

## 2017-01-30 ENCOUNTER — Ambulatory Visit (INDEPENDENT_AMBULATORY_CARE_PROVIDER_SITE_OTHER): Payer: Commercial Managed Care - PPO | Admitting: Obstetrics and Gynecology

## 2017-01-30 ENCOUNTER — Encounter: Payer: Self-pay | Admitting: Obstetrics and Gynecology

## 2017-01-30 VITALS — BP 123/72 | HR 76 | Wt 228.5 lb

## 2017-01-30 DIAGNOSIS — N950 Postmenopausal bleeding: Secondary | ICD-10-CM | POA: Diagnosis not present

## 2017-01-30 NOTE — Addendum Note (Signed)
Addended by: Raliegh Ip on: 01/30/2017 01:21 PM   Modules accepted: Orders

## 2017-01-30 NOTE — Progress Notes (Signed)
HPI:      [name] is a 66 y.o. [phone] who LMP was No LMP recorded (lmp unknown). Patient is postmenopausal.  Subjective:   She presents today For follow-up after her ultrasound for postmenopausal bleeding. She reports no further postmenopausal bleeding.    Hx: The following portions of the patient's history were reviewed and updated as appropriate:              She  has a past medical history of Chronic pain syndrome; Hyperlipidemia; Hypertension; Lumbago; and Spinal stenosis. She  does not have any pertinent problems on file. She  has a past surgical history that includes Tubal ligation. Her family history includes Arthritis in her mother; Breast cancer in her mother; Cancer in her maternal grandmother, mother, and sister; Heart disease in her father; Lung disease in her father; Multiple sclerosis in her sister; Osteoporosis in her mother. She  reports that she has never smoked. She has never used smokeless tobacco. She reports that she does not drink alcohol or use drugs. Current Outpatient Prescriptions on File Prior to Visit  Medication Sig Dispense Refill  . atorvastatin (LIPITOR) 20 MG tablet Take 1 tablet (20 mg total) by mouth daily. 90 tablet 1  . cyclobenzaprine (FLEXERIL) 10 MG tablet Take 1 tablet (10 mg total) by mouth at bedtime. 30 tablet 3  . gabapentin (NEURONTIN) 300 MG capsule TAKE 1 CAPSULE (300 MG TOTAL) BY MOUTH 3 (THREE) TIMES DAILY. 90 capsule 2  . meloxicam (MOBIC) 15 MG tablet TAKE 1 TABLET (15 MG TOTAL) BY MOUTH DAILY. 30 tablet 2   No current facility-administered medications on file prior to visit.          Review of Systems:  Review of Systems  Constitutional: Denied constitutional symptoms, night sweats, recent illness, fatigue, fever, insomnia and weight loss.  Eyes: Denied eye symptoms, eye pain, photophobia, vision change and visual disturbance.  Ears/Nose/Throat/Neck: Denied ear, nose, throat or neck symptoms, hearing loss, nasal  discharge, sinus congestion and sore throat.  Cardiovascular: Denied cardiovascular symptoms, arrhythmia, chest pain/pressure, edema, exercise intolerance, orthopnea and palpitations.  Respiratory: Denied pulmonary symptoms, asthma, pleuritic pain, productive sputum, cough, dyspnea and wheezing.  Gastrointestinal: Denied, gastro-esophageal reflux, melena, nausea and vomiting.  Genitourinary: Denied genitourinary symptoms including symptomatic vaginal discharge, pelvic relaxation issues, and urinary complaints.  Musculoskeletal: Denied musculoskeletal symptoms, stiffness, swelling, muscle weakness and myalgia.  Dermatologic: Denied dermatology symptoms, rash and scar.  Neurologic: Denied neurology symptoms, dizziness, headache, neck pain and syncope.  Psychiatric: Denied psychiatric symptoms, anxiety and depression.  Endocrine: Denied endocrine symptoms including hot flashes and night sweats.   Meds:   Current Outpatient Prescriptions on File Prior to Visit  Medication Sig Dispense Refill  . atorvastatin (LIPITOR) 20 MG tablet Take 1 tablet (20 mg total) by mouth daily. 90 tablet 1  . cyclobenzaprine (FLEXERIL) 10 MG tablet Take 1 tablet (10 mg total) by mouth at bedtime. 30 tablet 3  . gabapentin (NEURONTIN) 300 MG capsule TAKE 1 CAPSULE (300 MG TOTAL) BY MOUTH 3 (THREE) TIMES DAILY. 90 capsule 2  . meloxicam (MOBIC) 15 MG tablet TAKE 1 TABLET (15 MG TOTAL) BY MOUTH DAILY. 30 tablet 2   No current facility-administered medications on file prior to visit.     Objective:     Vitals:   01/30/17 1053  BP: 123/72  Pulse: 76              Physical examination   Pelvic:   Vulva: Normal appearance.  No lesions.  Vagina: No lesions or abnormalities noted.  Atrophic  Support: Pelvic relaxation. Third-degree rectocele 2nd cystocele   Urethra No masses tenderness or scarring.  Meatus Normal size without lesions or prolapse.  Cervix: Normal appearance.  No lesions.  Anus: Normal exam.  No  lesions.  Perineum: Normal exam.  No lesions.        Bimanual   Uterus: Normal size.  Non-tender.  Mobile.  AV.  Adnexae: No masses.  Non-tender to palpation.  Cul-de-sac: Negative for abnormality.   Endometrial Biopsy After discussion with the patient regarding her abnormal uterine bleeding I recommended that she proceed with an endometrial biopsy for further diagnosis. The risks, benefits, alternatives, and indications for an endometrial biopsy were discussed with the patient in detail. She understood the risks including infection, bleeding, cervical laceration and uterine perforation.  Verbal consent was obtained.   PROCEDURE NOTE:  Vacurette endometrial biopsy was performed using aseptic technique with iodine preparation.  The uterus was sounded to a length of 8 cm.  Adequate sampling was obtained with minimal blood loss. (small amount of tissue with 2 passes.)  The patient tolerated the procedure well.  Disposition will be pending pathology   Assessment:    [id number] Patient Active Problem List   Diagnosis Date Noted  . Insomnia 01/16/2017  . IFG (impaired fasting glucose) 09/12/2016  . Hyperlipidemia   . Hypertension   . Chronic pain syndrome   . Lumbago   . Spinal stenosis      1. Postmenopausal bleeding     Thickened endometrium by ultrasound.   Plan:            1.  Await endometrial biopsy results. Discussed management when they return.       F/U  Return in about 1 week (around 02/06/2017).  Finis Bud, M.D. 01/30/2017 11:41 AM

## 2017-02-01 LAB — PATHOLOGY

## 2017-02-07 ENCOUNTER — Ambulatory Visit (INDEPENDENT_AMBULATORY_CARE_PROVIDER_SITE_OTHER): Payer: Commercial Managed Care - PPO | Admitting: Obstetrics and Gynecology

## 2017-02-07 ENCOUNTER — Encounter: Payer: Self-pay | Admitting: Obstetrics and Gynecology

## 2017-02-07 VITALS — BP 143/77 | HR 79 | Wt 229.2 lb

## 2017-02-07 DIAGNOSIS — N950 Postmenopausal bleeding: Secondary | ICD-10-CM | POA: Diagnosis not present

## 2017-02-07 DIAGNOSIS — N840 Polyp of corpus uteri: Secondary | ICD-10-CM

## 2017-02-07 MED ORDER — NORETHINDRONE ACETATE 5 MG PO TABS: 5.0000 mg | ORAL_TABLET | Freq: Every day | ORAL | 2 refills | Status: DC

## 2017-02-07 NOTE — Progress Notes (Addendum)
HPI:      [name] is a 66 y.o. [phone] who LMP was No LMP recorded (lmp unknown). Patient is postmenopausal.  Subjective:   She presents today For follow-up of her endometrial biopsy for what appeared to be a can endometrium on ultrasound. She reports that she had some bleeding immediately after her biopsy but has not had any bleeding since.    Hx: The following portions of the patient's history were reviewed and updated as appropriate:             She  has a past medical history of Chronic pain syndrome; Hyperlipidemia; Hypertension; Lumbago; and Spinal stenosis. She  does not have any pertinent problems on file. She  has a past surgical history that includes Tubal ligation. Her family history includes Arthritis in her mother; Breast cancer in her mother; Cancer in her maternal grandmother, mother, and sister; Heart disease in her father; Lung disease in her father; Multiple sclerosis in her sister; Osteoporosis in her mother. She  reports that she has never smoked. She has never used smokeless tobacco. She reports that she does not drink alcohol or use drugs. She is allergic to erythromycin; penicillins; and zithromax [azithromycin].       Review of Systems:  Review of Systems  Constitutional: Denied constitutional symptoms, night sweats, recent illness, fatigue, fever, insomnia and weight loss.  Eyes: Denied eye symptoms, eye pain, photophobia, vision change and visual disturbance.  Ears/Nose/Throat/Neck: Denied ear, nose, throat or neck symptoms, hearing loss, nasal discharge, sinus congestion and sore throat.  Cardiovascular: Denied cardiovascular symptoms, arrhythmia, chest pain/pressure, edema, exercise intolerance, orthopnea and palpitations.  Respiratory: Denied pulmonary symptoms, asthma, pleuritic pain, productive sputum, cough, dyspnea and wheezing.  Gastrointestinal: Denied, gastro-esophageal reflux, melena, nausea and vomiting.  Genitourinary: Denied genitourinary  symptoms including symptomatic vaginal discharge, pelvic relaxation issues, and urinary complaints.  Musculoskeletal: Denied musculoskeletal symptoms, stiffness, swelling, muscle weakness and myalgia.  Dermatologic: Denied dermatology symptoms, rash and scar.  Neurologic: Denied neurology symptoms, dizziness, headache, neck pain and syncope.  Psychiatric: Denied psychiatric symptoms, anxiety and depression.  Endocrine: Denied endocrine symptoms including hot flashes and night sweats.   Meds:   Current Outpatient Prescriptions on File Prior to Visit  Medication Sig Dispense Refill  . atorvastatin (LIPITOR) 20 MG tablet Take 1 tablet (20 mg total) by mouth daily. 90 tablet 1  . cyclobenzaprine (FLEXERIL) 10 MG tablet Take 1 tablet (10 mg total) by mouth at bedtime. 30 tablet 3  . gabapentin (NEURONTIN) 300 MG capsule TAKE 1 CAPSULE (300 MG TOTAL) BY MOUTH 3 (THREE) TIMES DAILY. 90 capsule 2  . meloxicam (MOBIC) 15 MG tablet TAKE 1 TABLET (15 MG TOTAL) BY MOUTH DAILY. 30 tablet 2   No current facility-administered medications on file prior to visit.     Objective:     Vitals:   02/07/17 1051  BP: (!) 143/77  Pulse: 79              Endometrial biopsy results reviewed directly with the patient. Pathology findings reviewed.  Assessment:    [id number] Patient Active Problem List   Diagnosis Date Noted  . Insomnia 01/16/2017  . IFG (impaired fasting glucose) 09/12/2016  . Hyperlipidemia   . Hypertension   . Chronic pain syndrome   . Lumbago   . Spinal stenosis      1. Endometrial polyp   2. Postmenopausal bleeding     No postmenopausal bleeding since that initial episode with the exception of immediately  after biopsy.   Plan:            1.  We have discussed different options regarding care of her endometrial polyp. I have discussed the benign nature of this polyp based on endometrial biopsy. We discussed the possibility of D&C and the patient does not one this at this time.  We discussed the possible use of progesterone and treatment of endometrial polyps and simple hyperplasia. We have discussed expectant management. She has decided to take progesterone for 3 months. Should she have any further postmenopausal bleeding during this course of treatment were afterward I have strongly recommended D&C with hysteroscopy and polypectomy. She has agreed to contact me if any further bleeding results.  Orders No orders of the defined types were placed in this encounter.    Meds ordered this encounter  Medications  . norethindrone (AYGESTIN) 5 MG tablet    Sig: Take 1 tablet (5 mg total) by mouth daily. As directed    Dispense:  30 tablet    Refill:  2        F/U  Return in about 3 months (around 05/10/2017). I spent 16 minutes with this patient of which greater than 50% was spent discussing endometrial polyps, postmenopausal bleeding, different treatment options and need for D&C when necessary.  Finis Bud, M.D. 02/07/2017 11:14 AM

## 2017-03-13 ENCOUNTER — Other Ambulatory Visit: Payer: Self-pay

## 2017-03-13 ENCOUNTER — Ambulatory Visit (INDEPENDENT_AMBULATORY_CARE_PROVIDER_SITE_OTHER): Payer: Commercial Managed Care - PPO | Admitting: Unknown Physician Specialty

## 2017-03-13 ENCOUNTER — Encounter: Payer: Self-pay | Admitting: Unknown Physician Specialty

## 2017-03-13 VITALS — BP 133/83 | HR 67 | Temp 98.3°F | Ht 63.5 in | Wt 225.2 lb

## 2017-03-13 DIAGNOSIS — R7301 Impaired fasting glucose: Secondary | ICD-10-CM

## 2017-03-13 DIAGNOSIS — I10 Essential (primary) hypertension: Secondary | ICD-10-CM

## 2017-03-13 DIAGNOSIS — Z0000 Encounter for general adult medical examination without abnormal findings: Secondary | ICD-10-CM | POA: Diagnosis not present

## 2017-03-13 DIAGNOSIS — E782 Mixed hyperlipidemia: Secondary | ICD-10-CM

## 2017-03-13 LAB — BAYER DCA HB A1C WAIVED: HB A1C (BAYER DCA - WAIVED): 5.6 % (ref <7.0)

## 2017-03-13 NOTE — Assessment & Plan Note (Signed)
Check Hgb A1C 

## 2017-03-13 NOTE — Assessment & Plan Note (Signed)
Stable, continue present medications.   

## 2017-03-13 NOTE — Progress Notes (Signed)
BP 133/83   Pulse 67   Temp 98.3 F (36.8 C)   Ht 5' 3.5" (1.613 m)   Wt 225 lb 3.2 oz (102.2 kg)   LMP  (LMP Unknown)   SpO2 93%   BMI 39.27 kg/m    Subjective:    Patient ID: Kiara Parker, female    DOB: 28-Sep-1950, 66 y.o.   MRN: [id number]  HPI: Kiara Parker is a 66 y.o. female  Pt went to [facility] gyn for vaginal bleeding and thought they did a pap.  She is refusing a pap smear today but OK for next year.  I don't see documentation of a pap in the chart.    Hypertension Using medications without difficulty Average home BPs Not checking  No problems or lightheadedness No chest pain with exertion or shortness of breath No Edema  Hyperlipidemia Using medications without problems: No Muscle aches  Diet compliance:Exercise: Lost 4 pounds since last visit.  She is very busy with managing care of her mom.  She will be retiring in 6 months.    Chief Complaint  Patient presents with  . Annual Exam    pt states she had a pap smear about 2 months ago    Family History  Problem Relation Age of Onset  . Cancer Mother        breast  . Arthritis Mother   . Osteoporosis Mother   . Breast cancer Mother   . Heart disease Father   . Lung disease Father   . Cancer Sister        lymphoma  . Cancer Maternal Grandmother        lymphoma  . Multiple sclerosis Sister    Social History   Social History  . Marital status: Married    Spouse name: N/A  . Number of children: N/A  . Years of education: N/A   Occupational History  . Not on file.   Social History Main Topics  . Smoking status: Never Smoker  . Smokeless tobacco: Never Used  . Alcohol use No  . Drug use: No  . Sexual activity: Yes    Birth control/ protection: None   Other Topics Concern  . Not on file   Social History Narrative  . No narrative on file   Past Medical History:  Diagnosis Date  . Chronic pain syndrome   . Hyperlipidemia   . Hypertension   . Lumbago   . Spinal stenosis    Past  Surgical History:  Procedure Laterality Date  . TUBAL LIGATION     Relevant past medical, surgical, family and social history reviewed and updated as indicated. Interim medical history since our last visit reviewed. Allergies and medications reviewed and updated.  Review of Systems  Per HPI unless specifically indicated above     Objective:    BP 133/83   Pulse 67   Temp 98.3 F (36.8 C)   Ht 5' 3.5" (1.613 m)   Wt 225 lb 3.2 oz (102.2 kg)   LMP  (LMP Unknown)   SpO2 93%   BMI 39.27 kg/m   Wt Readings from Last 3 Encounters:  03/13/17 225 lb 3.2 oz (102.2 kg)  02/07/17 229 lb 4 oz (104 kg)  01/30/17 228 lb 8 oz (103.6 kg)    Physical Exam  Constitutional: She is oriented to person, place, and time. She appears well-developed and well-nourished.  HENT:  Head: Normocephalic and atraumatic.  Eyes: Pupils are equal, round, and reactive  to light. Right eye exhibits no discharge. Left eye exhibits no discharge. No scleral icterus.  Neck: Normal range of motion. Neck supple. Carotid bruit is not present. No thyromegaly present.  Cardiovascular: Normal rate, regular rhythm and normal heart sounds.  Exam reveals no gallop and no friction rub.   No murmur heard. Pulmonary/Chest: Effort normal and breath sounds normal. No respiratory distress. She has no wheezes. She has no rales.  Abdominal: Soft. Bowel sounds are normal. There is no tenderness. There is no rebound.  Genitourinary: No breast swelling, tenderness or discharge.  Musculoskeletal: Normal range of motion.  Lymphadenopathy:    She has no cervical adenopathy.  Neurological: She is alert and oriented to person, place, and time.  Skin: Skin is warm, dry and intact. No rash noted.  Psychiatric: She has a normal mood and affect. Her speech is normal and behavior is normal. Judgment and thought content normal. Cognition and memory are normal.      Assessment & Plan:   Problem List Items Addressed This Visit       Unprioritized   Hyperlipidemia    Stable, continue present medications.        Relevant Orders   Lipid Panel w/o Chol/HDL Ratio   Hypertension    Stable, continue present medications.        Relevant Orders   Comprehensive metabolic panel   Lipid Panel w/o Chol/HDL Ratio   IFG (impaired fasting glucose)    Check Hgb A1C      Relevant Orders   Bayer DCA Hb A1c Waived    Other Visit Diagnoses    Annual physical exam    -  Primary   Relevant Orders   CBC with Differential/Platelet   TSH       Follow up plan: Return in about 6 months (around 09/12/2017).

## 2017-03-14 ENCOUNTER — Encounter: Payer: Self-pay | Admitting: Unknown Physician Specialty

## 2017-03-14 LAB — CBC WITH DIFFERENTIAL/PLATELET
Basophils Absolute: 0 10*3/uL (ref 0.0–0.2)
Basos: 1 %
EOS (ABSOLUTE): 0.2 10*3/uL (ref 0.0–0.4)
Eos: 3 %
Hematocrit: 41.3 % (ref 34.0–46.6)
Hemoglobin: 13.9 g/dL (ref 11.1–15.9)
Immature Grans (Abs): 0 10*3/uL (ref 0.0–0.1)
Immature Granulocytes: 0 %
Lymphocytes Absolute: 1.9 10*3/uL (ref 0.7–3.1)
Lymphs: 29 %
MCH: 29.4 pg (ref 26.6–33.0)
MCHC: 33.7 g/dL (ref 31.5–35.7)
MCV: 87 fL (ref 79–97)
Monocytes Absolute: 0.5 10*3/uL (ref 0.1–0.9)
Monocytes: 7 %
Neutrophils Absolute: 4 10*3/uL (ref 1.4–7.0)
Neutrophils: 60 %
Platelets: 318 10*3/uL (ref 150–379)
RBC: 4.73 x10E6/uL (ref 3.77–5.28)
RDW: 13.8 % (ref 12.3–15.4)
WBC: 6.6 10*3/uL (ref 3.4–10.8)

## 2017-03-14 LAB — COMPREHENSIVE METABOLIC PANEL
ALT: 31 IU/L (ref 0–32)
AST: 30 IU/L (ref 0–40)
Albumin/Globulin Ratio: 1.5 (ref 1.2–2.2)
Albumin: 4.1 g/dL (ref 3.6–4.8)
Alkaline Phosphatase: 71 IU/L (ref 39–117)
BUN/Creatinine Ratio: 10 — ABNORMAL LOW (ref 12–28)
BUN: 9 mg/dL (ref 8–27)
Bilirubin Total: 0.5 mg/dL (ref 0.0–1.2)
CO2: 20 mmol/L (ref 20–29)
Calcium: 9.4 mg/dL (ref 8.7–10.3)
Chloride: 100 mmol/L (ref 96–106)
Creatinine, Ser: 0.87 mg/dL (ref 0.57–1.00)
GFR calc Af Amer: 81 mL/min/{1.73_m2} (ref >59)
GFR calc non Af Amer: 71 mL/min/{1.73_m2} (ref >59)
Globulin, Total: 2.7 g/dL (ref 1.5–4.5)
Glucose: 92 mg/dL (ref 65–99)
Potassium: 4.2 mmol/L (ref 3.5–5.2)
Sodium: 141 mmol/L (ref 134–144)
Total Protein: 6.8 g/dL (ref 6.0–8.5)

## 2017-03-14 LAB — LIPID PANEL W/O CHOL/HDL RATIO
Cholesterol, Total: 152 mg/dL (ref 100–199)
HDL: 38 mg/dL — ABNORMAL LOW (ref >39)
LDL Calculated: 94 mg/dL (ref 0–99)
Triglycerides: 98 mg/dL (ref 0–149)
VLDL Cholesterol Cal: 20 mg/dL (ref 5–40)

## 2017-03-14 LAB — TSH: TSH: 1.11 u[IU]/mL (ref 0.450–4.500)

## 2017-03-20 ENCOUNTER — Other Ambulatory Visit: Payer: Self-pay | Admitting: Unknown Physician Specialty

## 2017-05-10 ENCOUNTER — Encounter: Payer: Commercial Managed Care - PPO | Admitting: Obstetrics and Gynecology

## 2017-05-21 ENCOUNTER — Encounter: Payer: Commercial Managed Care - PPO | Admitting: Obstetrics and Gynecology

## 2017-05-25 ENCOUNTER — Encounter: Payer: Self-pay | Admitting: Obstetrics and Gynecology

## 2017-05-25 ENCOUNTER — Ambulatory Visit (INDEPENDENT_AMBULATORY_CARE_PROVIDER_SITE_OTHER): Payer: Commercial Managed Care - PPO | Admitting: Obstetrics and Gynecology

## 2017-05-25 VITALS — BP 123/67 | HR 73 | Ht 63.5 in | Wt 230.2 lb

## 2017-05-25 DIAGNOSIS — N840 Polyp of corpus uteri: Secondary | ICD-10-CM

## 2017-05-25 DIAGNOSIS — N950 Postmenopausal bleeding: Secondary | ICD-10-CM

## 2017-05-25 NOTE — Progress Notes (Signed)
HPI:      [name] is a 66 y.o. [phone] who LMP was No LMP recorded (lmp unknown). Patient is postmenopausal.  Subjective:   She presents today After taking 3 months of progesterone. She reports that she has had no further bleeding.    Hx: The following portions of the patient's history were reviewed and updated as appropriate:             She  has a past medical history of Chronic pain syndrome; Hyperlipidemia; Hypertension; Lumbago; and Spinal stenosis. She  does not have any pertinent problems on file. She  has a past surgical history that includes Tubal ligation. Her family history includes Arthritis in her mother; Breast cancer in her mother; Cancer in her maternal grandmother, mother, and sister; Heart disease in her father; Lung disease in her father; Multiple sclerosis in her sister; Osteoporosis in her mother. She  reports that she has never smoked. She has never used smokeless tobacco. She reports that she does not drink alcohol or use drugs. She is allergic to erythromycin; penicillins; and zithromax [azithromycin].       Review of Systems:  Review of Systems  Constitutional: Denied constitutional symptoms, night sweats, recent illness, fatigue, fever, insomnia and weight loss.  Eyes: Denied eye symptoms, eye pain, photophobia, vision change and visual disturbance.  Ears/Nose/Throat/Neck: Denied ear, nose, throat or neck symptoms, hearing loss, nasal discharge, sinus congestion and sore throat.  Cardiovascular: Denied cardiovascular symptoms, arrhythmia, chest pain/pressure, edema, exercise intolerance, orthopnea and palpitations.  Respiratory: Denied pulmonary symptoms, asthma, pleuritic pain, productive sputum, cough, dyspnea and wheezing.  Gastrointestinal: Denied, gastro-esophageal reflux, melena, nausea and vomiting.  Genitourinary: Denied genitourinary symptoms including symptomatic vaginal discharge, pelvic relaxation issues, and urinary complaints.   Musculoskeletal: Denied musculoskeletal symptoms, stiffness, swelling, muscle weakness and myalgia.  Dermatologic: Denied dermatology symptoms, rash and scar.  Neurologic: Denied neurology symptoms, dizziness, headache, neck pain and syncope.  Psychiatric: Denied psychiatric symptoms, anxiety and depression.  Endocrine: Denied endocrine symptoms including hot flashes and night sweats.   Meds:   Current Outpatient Prescriptions on File Prior to Visit  Medication Sig Dispense Refill  . atorvastatin (LIPITOR) 20 MG tablet TAKE 1 TABLET (20 MG TOTAL) BY MOUTH DAILY. 90 tablet 1  . cyclobenzaprine (FLEXERIL) 10 MG tablet Take 1 tablet (10 mg total) by mouth at bedtime. 30 tablet 3  . gabapentin (NEURONTIN) 300 MG capsule TAKE 1 CAPSULE (300 MG TOTAL) BY MOUTH 3 (THREE) TIMES DAILY. 90 capsule 2  . meloxicam (MOBIC) 15 MG tablet TAKE 1 TABLET (15 MG TOTAL) BY MOUTH DAILY. 30 tablet 2  . norethindrone (AYGESTIN) 5 MG tablet Take 5 mg by mouth daily.     No current facility-administered medications on file prior to visit.     Objective:     Vitals:   05/25/17 0923  BP: 123/67  Pulse: 73                Assessment:    G3P1021 Patient Active Problem List   Diagnosis Date Noted  . Insomnia 01/16/2017  . Obesity, Class II, BMI 35.0-39.9, with comorbidity (see actual BMI) 12/13/2016  . IFG (impaired fasting glucose) 09/12/2016  . Hyperlipidemia   . Hypertension   . Chronic pain syndrome   . Lumbago   . Spinal stenosis      1. Endometrial polyp   2. Postmenopausal bleeding     Patient has not had any further vaginal bleeding.   Plan:  1.  I have advised her to discontinue the progesterone and see what happens over the next 3 months. I expect her to have no further vaginal bleeding. She continues to experience vaginal bleeding would recommend D&C hysteroscopy. We have discussed this in some detail and she has a good understanding of this. She still declines D&C at  this time.  2. She had questions regarding her Pap smear and we have discussed this. She reports that her Paps have been normal. I have explained her that her family doctor can perform her Pap or I will gladly do. Probably every 2-3 years would be appropriate at this time.          F/U  Return for Pt to contact us if symptoms worsen. I spent 15 minutes with this patient of which greater than 50% was spent discussing endometrial polyps, progesterone use, postmenopausal bleeding, possible future D&C.  Finis Bud, M.D. 05/25/2017 9:56 AM

## 2017-06-10 ENCOUNTER — Other Ambulatory Visit: Payer: Self-pay | Admitting: Unknown Physician Specialty

## 2017-07-02 ENCOUNTER — Other Ambulatory Visit: Payer: Self-pay | Admitting: Unknown Physician Specialty

## 2017-09-23 ENCOUNTER — Other Ambulatory Visit: Payer: Self-pay | Admitting: Unknown Physician Specialty

## 2017-10-30 ENCOUNTER — Telehealth: Payer: Self-pay

## 2017-10-30 NOTE — Telephone Encounter (Signed)
This is an inexpensive medication.  I am happy to change, but it will not have the same effect.  Would pt consider paying out of pocket for this medication?

## 2017-10-30 NOTE — Telephone Encounter (Signed)
Received a letter from [facility] regarding patient's cyclobenazeprine not being covered. Looked on [employer] and it looks like methocarbamol or tizanidine may be covered instead. Is it possible to change the patient to one of these instead?

## 2017-10-30 NOTE — Telephone Encounter (Signed)
Called and spoke to patient. I let her know what Malachy Mood said. Patient states that her medication was picked up yesterday with no issue. Asked for patient to give [place] a call next time she tires to pick up her medication if it is too expensive for her. Patient verbalized understanding.

## 2017-11-23 ENCOUNTER — Other Ambulatory Visit: Payer: Self-pay | Admitting: Unknown Physician Specialty

## 2017-12-04 ENCOUNTER — Encounter: Payer: Self-pay | Admitting: Emergency Medicine

## 2017-12-04 ENCOUNTER — Other Ambulatory Visit: Payer: Self-pay

## 2017-12-04 ENCOUNTER — Emergency Department: Payer: Medicare HMO

## 2017-12-04 ENCOUNTER — Emergency Department
Admission: EM | Admit: 2017-12-04 | Discharge: 2017-12-04 | Disposition: A | Discharge status: Home / Self Care | Payer: Medicare HMO | Attending: Emergency Medicine | Admitting: Emergency Medicine

## 2017-12-04 DIAGNOSIS — S90221A Contusion of right lesser toe(s) with damage to nail, initial encounter: Secondary | ICD-10-CM | POA: Diagnosis not present

## 2017-12-04 DIAGNOSIS — Y9389 Activity, other specified: Secondary | ICD-10-CM | POA: Diagnosis not present

## 2017-12-04 DIAGNOSIS — I10 Essential (primary) hypertension: Secondary | ICD-10-CM | POA: Diagnosis not present

## 2017-12-04 DIAGNOSIS — S92534A Nondisplaced fracture of distal phalanx of right lesser toe(s), initial encounter for closed fracture: Secondary | ICD-10-CM | POA: Diagnosis not present

## 2017-12-04 DIAGNOSIS — S92424A Nondisplaced fracture of distal phalanx of right great toe, initial encounter for closed fracture: Secondary | ICD-10-CM | POA: Diagnosis not present

## 2017-12-04 DIAGNOSIS — Z79899 Other long term (current) drug therapy: Secondary | ICD-10-CM | POA: Insufficient documentation

## 2017-12-04 DIAGNOSIS — W208XXA Other cause of strike by thrown, projected or falling object, initial encounter: Secondary | ICD-10-CM | POA: Insufficient documentation

## 2017-12-04 DIAGNOSIS — S9031XA Contusion of right foot, initial encounter: Secondary | ICD-10-CM | POA: Diagnosis not present

## 2017-12-04 DIAGNOSIS — Y92810 Car as the place of occurrence of the external cause: Secondary | ICD-10-CM | POA: Insufficient documentation

## 2017-12-04 DIAGNOSIS — Y998 Other external cause status: Secondary | ICD-10-CM | POA: Diagnosis not present

## 2017-12-04 DIAGNOSIS — S99921A Unspecified injury of right foot, initial encounter: Secondary | ICD-10-CM | POA: Diagnosis present

## 2017-12-04 DIAGNOSIS — S92421A Displaced fracture of distal phalanx of right great toe, initial encounter for closed fracture: Secondary | ICD-10-CM | POA: Diagnosis not present

## 2017-12-04 DIAGNOSIS — S92531A Displaced fracture of distal phalanx of right lesser toe(s), initial encounter for closed fracture: Secondary | ICD-10-CM | POA: Diagnosis not present

## 2017-12-04 MED ORDER — HYDROCODONE-ACETAMINOPHEN 5-325 MG PO TABS: 1.0000 | ORAL_TABLET | Freq: Three times a day (TID) | ORAL | 0 refills | Status: DC | PRN

## 2017-12-04 MED ORDER — HYDROCODONE-ACETAMINOPHEN 5-325 MG PO TABS
1.0000 | ORAL_TABLET | Freq: Once | ORAL | Status: AC
  Administered 2017-12-04: 1 via ORAL
  Filled 2017-12-04: qty 1

## 2017-12-04 NOTE — ED Provider Notes (Addendum)
[facility] Emergency Department Provider Note ____________________________________________  Time seen: 1523  I have reviewed the triage vital signs and the nursing notes.  HISTORY  Chief Complaint  Foot Injury  HPI Kiara Parker is a 67 y.o. female presents to the ED accompanied by her husband, for evaluation of right foot pain.  Patient describes a mechanical injury where she was helping to lift a large empty filing cabinet from the back of an SUV, about an hour and a half prior to arrival.  The cabinet slipped, landing across the patient's right great and second toes, within her tennis shoe.  She denies any other injury at this time.  She describes increasing pain across the dorsal aspect of her first 2 toes.  She notes some bruising to the nailbed across the great toe and the lateral aspect of her second toe.  She is been able to ambulate with only mild discomfort.  She does some meloxicam prior to arrival.  Past Medical History:  Diagnosis Date  . Chronic pain syndrome   . Hyperlipidemia   . Hypertension   . Lumbago   . Spinal stenosis     Patient Active Problem List   Diagnosis Date Noted  . Insomnia 01/16/2017  . Obesity, Class II, BMI 35.0-39.9, with comorbidity (see actual BMI) 12/13/2016  . IFG (impaired fasting glucose) 09/12/2016  . Hyperlipidemia   . Hypertension   . Chronic pain syndrome   . Lumbago   . Spinal stenosis     Past Surgical History:  Procedure Laterality Date  . TUBAL LIGATION      Prior to Admission medications   Medication Sig Start Date End Date Taking? Authorizing Provider  atorvastatin (LIPITOR) 20 MG tablet TAKE 1 TABLET (20 MG TOTAL) BY MOUTH DAILY. 03/20/17   Kathrine Haddock, NP  cyclobenzaprine (FLEXERIL) 10 MG tablet TAKE 1 TABLET BY MOUTH EVERYDAY AT BEDTIME 11/26/17   Kathrine Haddock, NP  gabapentin (NEURONTIN) 300 MG capsule TAKE 1 CAPSULE (300 MG TOTAL) BY MOUTH 3 (THREE) TIMES DAILY. 12/11/16   Kathrine Haddock,  NP  meloxicam (MOBIC) 15 MG tablet TAKE 1 TABLET BY MOUTH EVERY DAY 09/24/17   Kathrine Haddock, NP  norethindrone (AYGESTIN) 5 MG tablet Take 5 mg by mouth daily. 03/11/17   [provider]    Allergies Erythromycin; Penicillins; and Zithromax [azithromycin]  Family History  Problem Relation Age of Onset  . Cancer Mother        breast  . Arthritis Mother   . Osteoporosis Mother   . Breast cancer Mother   . Heart disease Father   . Lung disease Father   . Cancer Sister        lymphoma  . Cancer Maternal Grandmother        lymphoma  . Multiple sclerosis Sister     Social History Social History   Tobacco Use  . Smoking status: Never Smoker  . Smokeless tobacco: Never Used  Substance Use Topics  . Alcohol use: No  . Drug use: No    Review of Systems  Constitutional: Negative for fever. Cardiovascular: Negative for chest pain. Musculoskeletal: Negative for back pain.  Right foot/toe pain as above.   Skin: Negative for rash. Neurological: Negative for headaches, focal weakness or numbness. ____________________________________________  PHYSICAL EXAM:  VITAL SIGNS: ED Triage Vitals  Enc Vitals Group     BP 12/04/17 1448 (!) 142/81     Pulse Rate 12/04/17 1448 85     Resp 12/04/17 1448  16     Temp 12/04/17 1448 98.2 F (36.8 C)     Temp Source 12/04/17 1448 Oral     SpO2 12/04/17 1448 92 %     Weight 12/04/17 1449 220 lb (99.8 kg)     Height 12/04/17 1449 5\' 4"  (1.626 m)     Head Circumference --      Peak Flow --      Pain Score 12/04/17 1449 7     Pain Loc --      Pain Edu? --      Excl. in [place]? --     Constitutional: Alert and oriented. Well appearing and in no distress. Head: Normocephalic and atraumatic. Eyes: Conjunctivae are normal. PERRL. Normal extraocular movements Cardiovascular: Normal rate, regular rhythm. Normal distal pulses. Respiratory: Normal respiratory effort. Musculoskeletal: right foot without obvious deformity. Normal foot,  toe, and ankle ROM. Great toe with 50% subungual hematoma noted. No nailbed deformity or avulsion. Nontender with normal range of motion in all extremities.  Neurologic:  Normal gait without ataxia. Normal speech and language. No gross focal neurologic deficits are appreciated. Skin:  Skin is warm, dry and intact. No abrasion or laceration noted. Bruising noted to the second toe.  ____________________________________________   RADIOLOGY  Right Foot  IMPRESSION: 1. Acute comminuted distal phalangeal fracture of the right great toe without significant displacement. 2. Acute transverse fracture of the distal phalanx of second toe. 3. No intra-articular involvement nor joint dislocation. 4. Calcaneal enthesopathy.  I, Hiroyuki Ozanich, Dannielle Karvonen, personally viewed and evaluated these images (plain radiographs) as part of my medical decision making, as well as reviewing the written report by the radiologist. ____________________________________________  PROCEDURES  Procedures Ice pack Post Op shoe Norco 5-325 mg PO ____________________________________________  INITIAL IMPRESSION / ASSESSMENT AND PLAN / ED COURSE  Patient with a ED evaluation of injury sustained following mechanical contusion to the right foot.  Patient presents with some bruising including a subungual hematoma to the right great toe.  The nail bed is otherwise intact.  Exam is overall benign.  X-ray is consistent with acute oblique fractures of the distal phalanx of the 1st & 2nd toes, respectively, without dislocation.  She is placed in a postop shoe for comfort.  She is advised to rest with the foot elevated and apply ice to reduce pain and swelling.  She will be given a prescription for Norco (#15) for moderate pain relief. She will follow-up with podiatry or her PCP for ongoing symptom management.  I reviewed the patient's prescription history over the last 12 months in the multi-state controlled substances database(s) that  includes [place], [place], [place], [place], [place], [place], [place], [place], [place], [place], [place], [place], [place], and [place].  Results were notable for no current narcotic prescriptions. ____________________________________________  FINAL CLINICAL IMPRESSION(S) / ED DIAGNOSES  Final diagnoses:  Closed nondisplaced fracture of distal phalanx of right great toe, initial encounter  Closed nondisplaced fracture of distal phalanx of lesser toe of right foot, initial encounter  Subungual hematoma of foot, right, initial encounter      Melvenia Needles, PA-C 12/04/17 1627    Charlita Brian, Dannielle Karvonen, PA-C 12/04/17 1628    Harvest Dark, MD 12/04/17 2130

## 2017-12-04 NOTE — ED Triage Notes (Signed)
See first nurse

## 2017-12-04 NOTE — ED Triage Notes (Signed)
First nurse note: Patient states filing cabinet fell on right foot approx one hour ago. Now having pain to right foot.

## 2017-12-04 NOTE — ED Notes (Signed)
See triage note  States she had a filing cabinet fall onto right foot   Pain is across great toe

## 2017-12-04 NOTE — Discharge Instructions (Addendum)
You have sustained fractures (breaks) to the tips of the big & second toes of the right foot. Wear the post-op shoe as directed for walking & standing. Rest with the foot elevated when seated, and apply ice to reduce swelling. Take the pain medicine, along with your daily medicines. Follow-up with Dr. Vickki Muff (podiatry) for continued treatment.

## 2017-12-11 DIAGNOSIS — S92424A Nondisplaced fracture of distal phalanx of right great toe, initial encounter for closed fracture: Secondary | ICD-10-CM | POA: Diagnosis not present

## 2017-12-11 DIAGNOSIS — S92534A Nondisplaced fracture of distal phalanx of right lesser toe(s), initial encounter for closed fracture: Secondary | ICD-10-CM | POA: Diagnosis not present

## 2017-12-11 DIAGNOSIS — S90211A Contusion of right great toe with damage to nail, initial encounter: Secondary | ICD-10-CM | POA: Diagnosis not present

## 2017-12-20 DIAGNOSIS — R42 Dizziness and giddiness: Secondary | ICD-10-CM | POA: Diagnosis not present

## 2017-12-20 DIAGNOSIS — E782 Mixed hyperlipidemia: Secondary | ICD-10-CM | POA: Diagnosis not present

## 2017-12-20 DIAGNOSIS — R0602 Shortness of breath: Secondary | ICD-10-CM | POA: Diagnosis not present

## 2017-12-25 DIAGNOSIS — S92534A Nondisplaced fracture of distal phalanx of right lesser toe(s), initial encounter for closed fracture: Secondary | ICD-10-CM | POA: Diagnosis not present

## 2017-12-25 DIAGNOSIS — S90211A Contusion of right great toe with damage to nail, initial encounter: Secondary | ICD-10-CM | POA: Diagnosis not present

## 2018-10-31 ENCOUNTER — Ambulatory Visit (INDEPENDENT_AMBULATORY_CARE_PROVIDER_SITE_OTHER): Payer: Medicare HMO | Admitting: Nurse Practitioner

## 2018-10-31 ENCOUNTER — Encounter: Payer: Self-pay | Admitting: Nurse Practitioner

## 2018-10-31 VITALS — BP 121/78 | HR 65 | Temp 98.0°F | Wt 227.0 lb

## 2018-10-31 DIAGNOSIS — Z780 Asymptomatic menopausal state: Secondary | ICD-10-CM

## 2018-10-31 DIAGNOSIS — Z1329 Encounter for screening for other suspected endocrine disorder: Secondary | ICD-10-CM

## 2018-10-31 DIAGNOSIS — M4800 Spinal stenosis, site unspecified: Secondary | ICD-10-CM

## 2018-10-31 DIAGNOSIS — E538 Deficiency of other specified B group vitamins: Secondary | ICD-10-CM | POA: Diagnosis not present

## 2018-10-31 DIAGNOSIS — Z1211 Encounter for screening for malignant neoplasm of colon: Secondary | ICD-10-CM

## 2018-10-31 DIAGNOSIS — I10 Essential (primary) hypertension: Secondary | ICD-10-CM

## 2018-10-31 DIAGNOSIS — G894 Chronic pain syndrome: Secondary | ICD-10-CM | POA: Diagnosis not present

## 2018-10-31 DIAGNOSIS — E559 Vitamin D deficiency, unspecified: Secondary | ICD-10-CM

## 2018-10-31 DIAGNOSIS — R7301 Impaired fasting glucose: Secondary | ICD-10-CM | POA: Diagnosis not present

## 2018-10-31 DIAGNOSIS — Z6839 Body mass index (BMI) 39.0-39.9, adult: Secondary | ICD-10-CM

## 2018-10-31 DIAGNOSIS — E782 Mixed hyperlipidemia: Secondary | ICD-10-CM

## 2018-10-31 DIAGNOSIS — Z1231 Encounter for screening mammogram for malignant neoplasm of breast: Secondary | ICD-10-CM

## 2018-10-31 MED ORDER — MELOXICAM 15 MG PO TABS: 15.0000 mg | ORAL_TABLET | Freq: Every day | ORAL | 5 refills | Status: DC

## 2018-10-31 MED ORDER — GABAPENTIN 300 MG PO CAPS: 300.0000 mg | ORAL_CAPSULE | Freq: Three times a day (TID) | ORAL | 5 refills | Status: DC

## 2018-10-31 MED ORDER — PRAVASTATIN SODIUM 20 MG PO TABS: 20.0000 mg | ORAL_TABLET | Freq: Every day | ORAL | 3 refills | Status: DC

## 2018-10-31 MED ORDER — BACLOFEN 10 MG PO TABS: 5.0000 mg | ORAL_TABLET | Freq: Every day | ORAL | 3 refills | Status: DC

## 2018-10-31 MED ORDER — HYDROCHLOROTHIAZIDE 12.5 MG PO TABS: 12.5000 mg | ORAL_TABLET | Freq: Every day | ORAL | 3 refills | Status: DC

## 2018-10-31 NOTE — Assessment & Plan Note (Signed)
Recommend healthy diet choices and regular exercise regimen (30 minutes 5 days a week).

## 2018-10-31 NOTE — Patient Instructions (Addendum)
Hydrochlorothiazide, HCTZ capsules or tablets What is this medicine? HYDROCHLOROTHIAZIDE (hye droe klor oh THYE a zide) is a diuretic. It increases the amount of urine passed, which causes the body to lose salt and water. This medicine is used to treat high blood pressure. It is also reduces the swelling and water retention caused by various medical conditions, such as heart, liver, or kidney disease. This medicine may be used for other purposes; ask your health care provider or pharmacist if you have questions. COMMON BRAND NAME(S): Esidrix, Ezide, HydroDIURIL, Microzide, Oretic, Zide What should I tell my health care provider before I take this medicine? They need to know if you have any of these conditions: -diabetes -gout -immune system problems, like lupus -kidney disease or kidney stones -liver disease -pancreatitis -small amount of urine or difficulty passing urine -an unusual or allergic reaction to hydrochlorothiazide, sulfa drugs, other medicines, foods, dyes, or preservatives -pregnant or trying to get pregnant -breast-feeding How should I use this medicine? Take this medicine by mouth with a glass of water. Follow the directions on the prescription label. Take your medicine at regular intervals. Remember that you will need to pass urine frequently after taking this medicine. Do not take your doses at a time of day that will cause you problems. Do not stop taking your medicine unless your doctor tells you to. Talk to your pediatrician regarding the use of this medicine in children. Special care may be needed. Overdosage: If you think you have taken too much of this medicine contact a poison control center or emergency room at once. NOTE: This medicine is only for you. Do not share this medicine with others. What if I miss a dose? If you miss a dose, take it as soon as you can. If it is almost time for your next dose, take only that dose. Do not take double or extra doses. What may  interact with this medicine? -cholestyramine -colestipol -digoxin -dofetilide -lithium -medicines for blood pressure -medicines for diabetes -medicines that relax muscles for surgery -other diuretics -steroid medicines like prednisone or cortisone This list may not describe all possible interactions. Give your health care provider a list of all the medicines, herbs, non-prescription drugs, or dietary supplements you use. Also tell them if you smoke, drink alcohol, or use illegal drugs. Some items may interact with your medicine. What should I watch for while using this medicine? Visit your doctor or health care professional for regular checks on your progress. Check your blood pressure as directed. Ask your doctor or health care professional what your blood pressure should be and when you should contact him or her. You may need to be on a special diet while taking this medicine. Ask your doctor. Check with your doctor or health care professional if you get an attack of severe diarrhea, nausea and vomiting, or if you sweat a lot. The loss of too much body fluid can make it dangerous for you to take this medicine. You may get drowsy or dizzy. Do not drive, use machinery, or do anything that needs mental alertness until you know how this medicine affects you. Do not stand or sit up quickly, especially if you are an older patient. This reduces the risk of dizzy or fainting spells. Alcohol may interfere with the effect of this medicine. Avoid alcoholic drinks. This medicine may affect your blood sugar level. If you have diabetes, check with your doctor or health care professional before changing the dose of your diabetic medicine. This medicine  can make you more sensitive to the sun. Keep out of the sun. If you cannot avoid being in the sun, wear protective clothing and use sunscreen. Do not use sun lamps or tanning beds/booths. What side effects may I notice from receiving this medicine? Side effects  that you should report to your doctor or health care professional as soon as possible: -allergic reactions such as skin rash or itching, hives, swelling of the lips, mouth, tongue, or throat -changes in vision -chest pain -eye pain -fast or irregular heartbeat -feeling faint or lightheaded, falls -gout attack -muscle pain or cramps -pain or difficulty when passing urine -pain, tingling, numbness in the hands or feet -redness, blistering, peeling or loosening of the skin, including inside the mouth -unusually weak or tired Side effects that usually do not require medical attention (report to your doctor or health care professional if they continue or are bothersome): -change in sex drive or performance -dry mouth -headache -stomach upset This list may not describe all possible side effects. Call your doctor for medical advice about side effects. You may report side effects to FDA at 1-800-FDA-1088. Where should I keep my medicine? Keep out of the reach of children. Store at room temperature between 15 and 30 degrees C (59 and 86 degrees F). Do not freeze. Protect from light and moisture. Keep container closed tightly. Throw away any unused medicine after the expiration date. NOTE: This sheet is a summary. It may not cover all possible information. If you have questions about this medicine, talk to your doctor, pharmacist, or health care provider.  2019 Elsevier/Gold Standard (2010-05-06 12:57:37) Baclofen tablets What is this medicine? BACLOFEN (BAK loe fen) helps relieve spasms and cramping of muscles. It may be used to treat symptoms of multiple sclerosis or spinal cord injury. This medicine may be used for other purposes; ask your health care provider or pharmacist if you have questions. COMMON BRAND NAME(S): ED Baclofen, Lioresal What should I tell my health care provider before I take this medicine? They need to know if you have any of these conditions: -kidney  disease -seizures -stroke -an unusual or allergic reaction to baclofen, other medicines, foods, dyes, or preservatives -pregnant or trying to get pregnant -breast-feeding How should I use this medicine? Take this medicine by mouth. Swallow it with a drink of water. Follow the directions on the prescription label. Do not take more medicine than you are told to take. Talk to your pediatrician regarding the use of this medicine in children. Special care may be needed. Overdosage: If you think you have taken too much of this medicine contact a poison control center or emergency room at once. NOTE: This medicine is only for you. Do not share this medicine with others. What if I miss a dose? If you miss a dose, take it as soon as you can. If it is almost time for your next dose, take only that dose. Do not take double or extra doses. What may interact with this medicine? Do not take this medication with any of the following medicines: -narcotic medicines for cough This medicine may also interact with the following medications: -alcohol -antihistamines for allergy, cough and cold -certain medicines for anxiety or sleep -certain medicines for depression like amitriptyline, fluoxetine, sertraline -certain medicines for seizures like phenobarbital, primidone -general anesthetics like halothane, isoflurane, methoxyflurane, propofol -local anesthetics like lidocaine, pramoxine, tetracaine -medicines that relax muscles for surgery -narcotic medicines for pain -phenothiazines like chlorpromazine, mesoridazine, prochlorperazine, thioridazine This list may not  describe all possible interactions. Give your health care provider a list of all the medicines, herbs, non-prescription drugs, or dietary supplements you use. Also tell them if you smoke, drink alcohol, or use illegal drugs. Some items may interact with your medicine. What should I watch for while using this medicine? Tell your doctor or health  care professional if your symptoms do not start to get better or if they get worse. Do not suddenly stop taking your medicine. If you do, you may develop a severe reaction. If your doctor wants you to stop the medicine, the dose will be slowly lowered over time to avoid any side effects. Follow the advice of your doctor. You may get drowsy or dizzy. Do not drive, use machinery, or do anything that needs mental alertness until you know how this medicine affects you. Do not stand or sit up quickly, especially if you are an older patient. This reduces the risk of dizzy or fainting spells. Alcohol may interfere with the effect of this medicine. Avoid alcoholic drinks. If you are taking another medicine that also causes drowsiness, you may have more side effects. Give your health care provider a list of all medicines you use. Your doctor will tell you how much medicine to take. Do not take more medicine than directed. Call emergency for help if you have problems breathing or unusual sleepiness. What side effects may I notice from receiving this medicine? Side effects that you should report to your doctor or health care professional as soon as possible: -allergic reactions like skin rash, itching or hives, swelling of the face, lips, or tongue -breathing problems -changes in emotions or moods -changes in vision -chest pain -fast, irregular heartbeat -feeling faint or lightheaded, falls -hallucinations -loss of balance or coordination -ringing of the ears -seizures -trouble passing urine or change in the amount of urine -trouble walking -unusually weak or tired Side effects that usually do not require medical attention (report to your doctor or health care professional if they continue or are bothersome): -changes in taste -confusion -constipation -diarrhea -dry mouth -headache -muscle weakness -nausea, vomiting -trouble sleeping This list may not describe all possible side effects. Call your  doctor for medical advice about side effects. You may report side effects to FDA at 1-800-FDA-1088. Where should I keep my medicine? Keep out of the reach of children. Store at room temperature between 15 and 30 degrees C (59 and 86 degrees F). Keep container tightly closed. Throw away any unused medicine after the expiration date. NOTE: This sheet is a summary. It may not cover all possible information. If you have questions about this medicine, talk to your doctor, pharmacist, or health care provider.  2019 [employer]/Gold Standard (2017-06-23 09:56:42) Pravastatin tablets What is this medicine? PRAVASTATIN (PRA va stat in) is known as a HMG-CoA reductase inhibitor or 'statin'. It lowers the level of cholesterol and triglycerides in the blood. This drug may also reduce the risk of heart attack, stroke, or other health problems in patients with risk factors for heart disease. Diet and lifestyle changes are often used with this drug. This medicine may be used for other purposes; ask your health care provider or pharmacist if you have questions. COMMON BRAND NAME(S): Pravachol What should I tell my health care provider before I take this medicine? They need to know if you have any of these conditions: -diabetes -if you often drink alcohol -history of stroke -kidney disease -liver disease -muscle aches or weakness -thyroid disease -an unusual or allergic  reaction to pravastatin, other medicines, foods, dyes, or preservatives -pregnant or trying to get pregnant -breast-feeding How should I use this medicine? Take pravastatin tablets by mouth. Swallow the tablets with a drink of water. Pravastatin can be taken at anytime of the day, with or without food. Follow the directions on the prescription label. Take your doses at regular intervals. Do not take your medicine more often than directed. Talk to your pediatrician regarding the use of this medicine in children. Special care may be needed.  Pravastatin has been used in children as young as 21 years of age. Overdosage: If you think you have taken too much of this medicine contact a poison control center or emergency room at once. NOTE: This medicine is only for you. Do not share this medicine with others. What if I miss a dose? If you miss a dose, take it as soon as you can. If it is almost time for your next dose, take only that dose. Do not take double or extra doses. What may interact with this medicine? This medicine may interact with the following medications: -colchicine -cyclosporine -other medicines for high cholesterol -some antibiotics like azithromycin, clarithromycin, erythromycin, and telithromycin This list may not describe all possible interactions. Give your health care provider a list of all the medicines, herbs, non-prescription drugs, or dietary supplements you use. Also tell them if you smoke, drink alcohol, or use illegal drugs. Some items may interact with your medicine. What should I watch for while using this medicine? Visit your doctor or health care professional for regular check-ups. You may need regular tests to make sure your liver is working properly. Your health care professional may tell you to stop taking this medicine if you develop muscle problems. If your muscle problems do not go away after stopping this medicine, contact your health care professional. Do not become pregnant while taking this medicine. Women should inform their health care professional if they wish to become pregnant or think they might be pregnant. There is a potential for serious side effects to an unborn child. Talk to your health care professional or pharmacist for more information. Do not breast-feed an infant while taking this medicine. This medicine may affect blood sugar levels. If you have diabetes, check with your doctor or health care professional before you change your diet or the dose of your diabetic medicine. If you are  going to need surgery or other procedure, tell your doctor that you are using this medicine. This drug is only part of a total heart-health program. Your doctor or a dietician can suggest a low-cholesterol and low-fat diet to help. Avoid alcohol and smoking, and keep a proper exercise schedule. This medicine may cause a decrease in Co-Enzyme Q-10. You should make sure that you get enough Co-Enzyme Q-10 while you are taking this medicine. Discuss the foods you eat and the vitamins you take with your health care professional. What side effects may I notice from receiving this medicine? Side effects that you should report to your doctor or health care professional as soon as possible: -allergic reactions like skin rash, itching or hives, swelling of the face, lips, or tongue -dark urine -fever -muscle pain, cramps, or weakness -redness, blistering, peeling or loosening of the skin, including inside the mouth -trouble passing urine or change in the amount of urine -unusually weak or tired -yellowing of the eyes or skin Side effects that usually do not require medical attention (report to your doctor or health care professional if  they continue or are bothersome): -gas -headache -heartburn -indigestion -stomach pain This list may not describe all possible side effects. Call your doctor for medical advice about side effects. You may report side effects to FDA at 1-800-FDA-1088. Where should I keep my medicine? Keep out of the reach of children. Store at room temperature between 15 to 30 degrees C (59 to 86 degrees F). Protect from light. Keep container tightly closed. Throw away any unused medicine after the expiration date. NOTE: This sheet is a summary. It may not cover all possible information. If you have questions about this medicine, talk to your doctor, pharmacist, or health care provider.  2019 [employer] Standard (2017-05-15 12:37:09)

## 2018-10-31 NOTE — Assessment & Plan Note (Signed)
Chronic,stable with Gabapentin and Meloxicam.  Script for Baclofen 5 MG at bedtime sent, as Flexeril no longer covered by insurance.  Recommend regular exercise and weight loss.  Consider physical therapy or ortho consult if worsening pain.

## 2018-10-31 NOTE — Assessment & Plan Note (Signed)
Chronic, BP today 121/78.  Does have mild edema BLE.  Will trial HCTZ 12.5 MG daily and have return in one month for physical and follow-up.  CBC and CMP today.

## 2018-10-31 NOTE — Assessment & Plan Note (Signed)
Chronic, continue Gabapentin and Meloxicam.  Script for Baclofen 5 MG at bedtime sent, as Flexeril no longer covered by insurance.  Recommend regular exercise and weight loss.  Consider physical therapy or ortho consult if worsening pain.

## 2018-10-31 NOTE — Assessment & Plan Note (Signed)
Chronic, poor compliance with medication.  Lipid panel today.  Change to Pravastatin and recheck in one month for ADR and compliance.

## 2018-10-31 NOTE — Progress Notes (Signed)
BP 121/78   Pulse 65   Temp 98 F (36.7 C) (Oral)   Wt 227 lb (103 kg)   LMP  (LMP Unknown)   SpO2 93%   BMI 38.96 kg/m    Subjective:    Patient ID: Kiara Parker, female    DOB: November 10, 1950, 68 y.o.   MRN: [id number]  HPI: Kiara Parker is a 68 y.o. female presents for follow-up  Chief Complaint  Patient presents with  . Hyperlipidemia  . Pain  . IFG   HYPERTENSION / HYPERLIPIDEMIA No current BP medications.  She has not been to provider in 1 1/2 years and has not taken Lipitor.  States she had muscle pains with it, but she does not know if it was medication or her chronic back pain causing muscle pain.  Reports she has had high cholesterol since age 59.  She is followed by Dr. Nehemiah Massed, has not seen him since 2018. She reports she got a "clear heart bill of health". Satisfied with current treatment? yes Duration of hypertension: years BP monitoring frequency: not checking BP range:  Duration of hyperlipidemia: chronic Cholesterol medication side effects: no Cholesterol supplements: none Medication compliance: poor compliance Aspirin: no Recent stressors: no Recurrent headaches: no Visual changes: no Palpitations: no Dyspnea: no Chest pain: no Lower extremity edema: on occasion Dizzy/lightheaded: no   IFG: Noted on previous labs.  Last A1C in 2018 was 5.6 and prior to this in 2017 was 6.0.  Does endorse chronic issues with bladder control, has children and had vaginal births.  She denies polyuria, polydipsia, polyphagia.  Reports she does get tingling in hands and feet + on occasionally feet swell.    CHRONIC PAIN  Has spinal stenosis at baseline.  At home takes Gabapentin 300 MG three times a day (when "I remember it", does not always remember daytime doses) and takes Meloxicam every day.  Reports that this manages her pain well.  Reports Flexeril was no longer covered by insurance and would like alternative which may be covered for night time. Pain control  status: controlled Duration: chronic Location:  Quality: dull and aching Current Pain Level: mild Previous Pain Level: moderate Breakthrough pain: no Benefit from narcotic medications: none What Activities task can be accomplished with current medication? Interested in weaning off narcotics: no current opioids Stool softners/OTC fiber: no  Previous pain specialty evaluation: no Non-narcotic analgesic meds: yes Narcotic contract: no current opioids   Relevant past medical, surgical, family and social history reviewed and updated as indicated. Interim medical history since our last visit reviewed. Allergies and medications reviewed and updated.  Review of Systems  Constitutional: Negative for activity change, appetite change, diaphoresis, fatigue and fever.  Respiratory: Negative for cough, chest tightness, shortness of breath and wheezing.   Cardiovascular: Positive for leg swelling (occasionally if on feet a lot). Negative for chest pain and palpitations.  Gastrointestinal: Negative for abdominal distention, abdominal pain, constipation, diarrhea, nausea and vomiting.  Endocrine: Negative for cold intolerance, heat intolerance, polydipsia, polyphagia and polyuria.  Musculoskeletal: Positive for back pain. Negative for myalgias.  Neurological: Negative for dizziness, syncope, weakness, light-headedness, numbness and headaches.  Psychiatric/Behavioral: Negative.     Per HPI unless specifically indicated above     Objective:    BP 121/78   Pulse 65   Temp 98 F (36.7 C) (Oral)   Wt 227 lb (103 kg)   LMP  (LMP Unknown)   SpO2 93%   BMI 38.96 kg/m   Wt Readings  from Last 3 Encounters:  10/31/18 227 lb (103 kg)  12/04/17 220 lb (99.8 kg)  05/25/17 230 lb 4 oz (104.4 kg)    Physical Exam Vitals signs and nursing note reviewed.  Constitutional:      General: She is awake.     Appearance: She is well-developed. She is obese.  HENT:     Head: Normocephalic.     Right  Ear: Hearing normal.     Left Ear: Hearing normal.     Nose: Nose normal.     Mouth/Throat:     Mouth: Mucous membranes are moist.  Eyes:     General: Lids are normal.        Right eye: No discharge.        Left eye: No discharge.     Conjunctiva/sclera: Conjunctivae normal.     Pupils: Pupils are equal, round, and reactive to light.  Neck:     Musculoskeletal: Normal range of motion and neck supple.     Thyroid: No thyromegaly.     Vascular: No carotid bruit or JVD.  Cardiovascular:     Rate and Rhythm: Normal rate and regular rhythm.     Heart sounds: Normal heart sounds. No murmur. No gallop.   Pulmonary:     Effort: Pulmonary effort is normal.     Breath sounds: Normal breath sounds.  Abdominal:     General: Bowel sounds are normal.     Palpations: Abdomen is soft. There is no hepatomegaly or splenomegaly.  Musculoskeletal:     Right lower leg: Edema (1+) present.     Left lower leg: Edema (1+) present.  Lymphadenopathy:     Cervical: No cervical adenopathy.  Skin:    General: Skin is warm and dry.  Neurological:     Mental Status: She is alert and oriented to person, place, and time.     Deep Tendon Reflexes: Reflexes are normal and symmetric.     Reflex Scores:      Brachioradialis reflexes are 2+ on the right side and 2+ on the left side.      Patellar reflexes are 2+ on the right side and 2+ on the left side. Psychiatric:        Attention and Perception: Attention normal.        Mood and Affect: Mood normal.        Behavior: Behavior normal. Behavior is cooperative.        Thought Content: Thought content normal.        Judgment: Judgment normal.     Results for orders placed or performed in visit on 03/13/17  CBC with Differential/Platelet  Result Value Ref Range   WBC 6.6 3.4 - 10.8 x10E3/uL   RBC 4.73 3.77 - 5.28 x10E6/uL   Hemoglobin 13.9 11.1 - 15.9 g/dL   Hematocrit 41.3 34.0 - 46.6 %   MCV 87 79 - 97 fL   MCH 29.4 26.6 - 33.0 pg   MCHC 33.7 31.5 -  35.7 g/dL   RDW 13.8 12.3 - 15.4 %   Platelets 318 150 - 379 x10E3/uL   Neutrophils 60 Not Estab. %   Lymphs 29 Not Estab. %   Monocytes 7 Not Estab. %   Eos 3 Not Estab. %   Basos 1 Not Estab. %   Neutrophils Absolute 4.0 1.4 - 7.0 x10E3/uL   Lymphocytes Absolute 1.9 0.7 - 3.1 x10E3/uL   Monocytes Absolute 0.5 0.1 - 0.9 x10E3/uL   EOS (ABSOLUTE) 0.2 0.0 -  0.4 x10E3/uL   Basophils Absolute 0.0 0.0 - 0.2 x10E3/uL   Immature Granulocytes 0 Not Estab. %   Immature Grans (Abs) 0.0 0.0 - 0.1 x10E3/uL  Comprehensive metabolic panel  Result Value Ref Range   Glucose 92 65 - 99 mg/dL   BUN 9 8 - 27 mg/dL   Creatinine, Ser 0.87 0.57 - 1.00 mg/dL   GFR calc non Af Amer 71 >59 mL/min/1.73   GFR calc Af Amer 81 >59 mL/min/1.73   BUN/Creatinine Ratio 10 (L) 12 - 28   Sodium 141 134 - 144 mmol/L   Potassium 4.2 3.5 - 5.2 mmol/L   Chloride 100 96 - 106 mmol/L   CO2 20 20 - 29 mmol/L   Calcium 9.4 8.7 - 10.3 mg/dL   Total Protein 6.8 6.0 - 8.5 g/dL   Albumin 4.1 3.6 - 4.8 g/dL   Globulin, Total 2.7 1.5 - 4.5 g/dL   Albumin/Globulin Ratio 1.5 1.2 - 2.2   Bilirubin Total 0.5 0.0 - 1.2 mg/dL   Alkaline Phosphatase 71 39 - 117 IU/L   AST 30 0 - 40 IU/L   ALT 31 0 - 32 IU/L  Lipid Panel w/o Chol/HDL Ratio  Result Value Ref Range   Cholesterol, Total 152 100 - 199 mg/dL   Triglycerides 98 0 - 149 mg/dL   HDL 38 (L) >39 mg/dL   VLDL Cholesterol Cal 20 5 - 40 mg/dL   LDL Calculated 94 0 - 99 mg/dL  TSH  Result Value Ref Range   TSH 1.110 0.450 - 4.500 uIU/mL  Bayer DCA Hb A1c Waived  Result Value Ref Range   HB A1C (BAYER DCA - WAIVED) 5.6 <7.0 %      Assessment & Plan:   Problem List Items Addressed This Visit      Cardiovascular and Mediastinum   Hypertension - Primary    Chronic, BP today 121/78.  Does have mild edema BLE.  Will trial HCTZ 12.5 MG daily and have return in one month for physical and follow-up.  CBC and CMP today.      Relevant Medications   hydrochlorothiazide  (HYDRODIURIL) 12.5 MG tablet   pravastatin (PRAVACHOL) 20 MG tablet   Other Relevant Orders   CBC with Differential/Platelet   Comprehensive metabolic panel     Endocrine   IFG (impaired fasting glucose)    A1C on labs today.  Initiate medications as needed based on results.      Relevant Orders   HgB A1c     Other   Hyperlipidemia    Chronic, poor compliance with medication.  Lipid panel today.  Change to Pravastatin and recheck in one month for ADR and compliance.      Relevant Medications   hydrochlorothiazide (HYDRODIURIL) 12.5 MG tablet   pravastatin (PRAVACHOL) 20 MG tablet   Other Relevant Orders   Lipid Panel w/o Chol/HDL Ratio   Chronic pain syndrome    Chronic,stable with Gabapentin and Meloxicam.  Script for Baclofen 5 MG at bedtime sent, as Flexeril no longer covered by insurance.  Recommend regular exercise and weight loss.  Consider physical therapy or ortho consult if worsening pain.      Relevant Orders   VITAMIN D 25 Hydroxy (Vit-D Deficiency, Fractures)   Spinal stenosis    Chronic, continue Gabapentin and Meloxicam.  Script for Baclofen 5 MG at bedtime sent, as Flexeril no longer covered by insurance.  Recommend regular exercise and weight loss.  Consider physical therapy or ortho consult if worsening  pain.      Obesity    Recommend healthy diet choices and regular exercise regimen (30 minutes 5 days a week).       Other Visit Diagnoses    Thyroid disorder screen       Relevant Orders   TSH   Vitamin D deficiency       Colon cancer screening       Relevant Orders   Ambulatory referral to Gastroenterology   Postmenopausal estrogen deficiency       Relevant Orders   DG Bone Density   Visit for screening mammogram       Relevant Orders   MM DIGITAL SCREENING BILATERAL   Vitamin B12 deficiency       Reports she had low level years ago and wants rechecked   Relevant Orders   Vitamin B12       Follow up plan: Return in about 1 month (around  11/29/2018) for pap smear.

## 2018-10-31 NOTE — Assessment & Plan Note (Signed)
A1C on labs today.  Initiate medications as needed based on results.

## 2018-11-01 ENCOUNTER — Other Ambulatory Visit: Payer: Self-pay | Admitting: Nurse Practitioner

## 2018-11-01 DIAGNOSIS — E559 Vitamin D deficiency, unspecified: Secondary | ICD-10-CM | POA: Insufficient documentation

## 2018-11-01 LAB — COMPREHENSIVE METABOLIC PANEL
ALT: 25 IU/L (ref 0–32)
AST: 21 IU/L (ref 0–40)
Albumin/Globulin Ratio: 1.6 (ref 1.2–2.2)
Albumin: 4.2 g/dL (ref 3.8–4.8)
Alkaline Phosphatase: 74 IU/L (ref 39–117)
BUN/Creatinine Ratio: 13 (ref 12–28)
BUN: 10 mg/dL (ref 8–27)
Bilirubin Total: 0.5 mg/dL (ref 0.0–1.2)
CO2: 25 mmol/L (ref 20–29)
Calcium: 9.2 mg/dL (ref 8.7–10.3)
Chloride: 101 mmol/L (ref 96–106)
Creatinine, Ser: 0.77 mg/dL (ref 0.57–1.00)
GFR calc Af Amer: 93 mL/min/{1.73_m2} (ref >59)
GFR calc non Af Amer: 81 mL/min/{1.73_m2} (ref >59)
Globulin, Total: 2.6 g/dL (ref 1.5–4.5)
Glucose: 95 mg/dL (ref 65–99)
Potassium: 4.7 mmol/L (ref 3.5–5.2)
Sodium: 140 mmol/L (ref 134–144)
Total Protein: 6.8 g/dL (ref 6.0–8.5)

## 2018-11-01 LAB — HEMOGLOBIN A1C
Est. average glucose Bld gHb Est-mCnc: 126 mg/dL
Hgb A1c MFr Bld: 6 % — ABNORMAL HIGH (ref 4.8–5.6)

## 2018-11-01 LAB — CBC WITH DIFFERENTIAL/PLATELET
Basophils Absolute: 0 10*3/uL (ref 0.0–0.2)
Basos: 1 %
EOS (ABSOLUTE): 0.2 10*3/uL (ref 0.0–0.4)
Eos: 4 %
Hematocrit: 40.8 % (ref 34.0–46.6)
Hemoglobin: 13.6 g/dL (ref 11.1–15.9)
Immature Grans (Abs): 0 10*3/uL (ref 0.0–0.1)
Immature Granulocytes: 0 %
Lymphocytes Absolute: 1.9 10*3/uL (ref 0.7–3.1)
Lymphs: 35 %
MCH: 29.7 pg (ref 26.6–33.0)
MCHC: 33.3 g/dL (ref 31.5–35.7)
MCV: 89 fL (ref 79–97)
Monocytes Absolute: 0.5 10*3/uL (ref 0.1–0.9)
Monocytes: 9 %
Neutrophils Absolute: 2.8 10*3/uL (ref 1.4–7.0)
Neutrophils: 51 %
Platelets: 293 10*3/uL (ref 150–450)
RBC: 4.58 x10E6/uL (ref 3.77–5.28)
RDW: 12.9 % (ref 11.7–15.4)
WBC: 5.4 10*3/uL (ref 3.4–10.8)

## 2018-11-01 LAB — LIPID PANEL W/O CHOL/HDL RATIO
Cholesterol, Total: 237 mg/dL — ABNORMAL HIGH (ref 100–199)
HDL: 47 mg/dL (ref >39)
LDL Calculated: 156 mg/dL — ABNORMAL HIGH (ref 0–99)
Triglycerides: 172 mg/dL — ABNORMAL HIGH (ref 0–149)
VLDL Cholesterol Cal: 34 mg/dL (ref 5–40)

## 2018-11-01 LAB — TSH: TSH: 1.41 u[IU]/mL (ref 0.450–4.500)

## 2018-11-01 LAB — VITAMIN B12: Vitamin B-12: 467 pg/mL (ref 232–1245)

## 2018-11-01 LAB — VITAMIN D 25 HYDROXY (VIT D DEFICIENCY, FRACTURES): Vit D, 25-Hydroxy: 11.3 ng/mL — ABNORMAL LOW (ref 30.0–100.0)

## 2018-11-01 MED ORDER — CHOLECALCIFEROL 1.25 MG (50000 UT) PO CAPS: 50000.0000 [IU] | ORAL_CAPSULE | ORAL | 0 refills | Status: DC

## 2018-11-01 NOTE — Progress Notes (Signed)
Cholecalciferol 50000 units script sent, to take for 8 weeks on weekly basis and then transition to OTC 800 to 1000 units daily.

## 2018-11-06 ENCOUNTER — Telehealth: Payer: Self-pay | Admitting: Gastroenterology

## 2018-11-06 NOTE — Telephone Encounter (Signed)
Patient called returning Michelle's call to schedule a colonoscopy

## 2018-11-07 ENCOUNTER — Other Ambulatory Visit: Payer: Self-pay

## 2018-11-07 DIAGNOSIS — Z1211 Encounter for screening for malignant neoplasm of colon: Secondary | ICD-10-CM

## 2018-11-07 NOTE — Telephone Encounter (Signed)
Patients call has been returned.  Her colonoscopy has been scheduled for 11/27/18 at [facility] with Dr. Vicente Males.  Thanks [employer]

## 2018-11-12 ENCOUNTER — Telehealth: Payer: Self-pay | Admitting: Nurse Practitioner

## 2018-11-12 NOTE — Telephone Encounter (Signed)
Will discuss with patient at next visit and discuss possible trial Rosuvastatin on 3 day week schedule.

## 2018-11-12 NOTE — Telephone Encounter (Signed)
Copied from [place] [id number]. Topic: General - Other >> Nov 12, 2018 12:56 PM Windy Kalata wrote: Reason for CRM: Patient states that she no longer wished to take the pravastatin (PRAVACHOL) 20 MG tablet she has been in pain since taking it  Best call back is [phone]

## 2018-11-12 NOTE — Telephone Encounter (Signed)
Attempted to reach pt. VM box was full.

## 2018-11-13 NOTE — Telephone Encounter (Signed)
Patient's husband notified  

## 2018-11-24 ENCOUNTER — Other Ambulatory Visit: Payer: Self-pay | Admitting: Nurse Practitioner

## 2018-11-27 ENCOUNTER — Encounter
Admission: RE | Disposition: A | Discharge status: Home / Self Care | Payer: Self-pay | Source: Home / Self Care | Attending: Gastroenterology

## 2018-11-27 ENCOUNTER — Ambulatory Visit: Payer: Medicare HMO | Admitting: Anesthesiology

## 2018-11-27 ENCOUNTER — Other Ambulatory Visit: Payer: Self-pay

## 2018-11-27 ENCOUNTER — Ambulatory Visit
Admission: RE | Admit: 2018-11-27 | Discharge: 2018-11-27 | Disposition: A | Discharge status: Home / Self Care | Payer: Medicare HMO | Attending: Gastroenterology | Admitting: Gastroenterology

## 2018-11-27 DIAGNOSIS — K621 Rectal polyp: Secondary | ICD-10-CM | POA: Diagnosis not present

## 2018-11-27 DIAGNOSIS — Z86010 Personal history of colonic polyps: Secondary | ICD-10-CM | POA: Insufficient documentation

## 2018-11-27 DIAGNOSIS — E785 Hyperlipidemia, unspecified: Secondary | ICD-10-CM | POA: Diagnosis not present

## 2018-11-27 DIAGNOSIS — Z79899 Other long term (current) drug therapy: Secondary | ICD-10-CM | POA: Diagnosis not present

## 2018-11-27 DIAGNOSIS — K5730 Diverticulosis of large intestine without perforation or abscess without bleeding: Secondary | ICD-10-CM | POA: Diagnosis not present

## 2018-11-27 DIAGNOSIS — K635 Polyp of colon: Secondary | ICD-10-CM | POA: Diagnosis not present

## 2018-11-27 DIAGNOSIS — I10 Essential (primary) hypertension: Secondary | ICD-10-CM | POA: Insufficient documentation

## 2018-11-27 DIAGNOSIS — Z1211 Encounter for screening for malignant neoplasm of colon: Secondary | ICD-10-CM | POA: Diagnosis not present

## 2018-11-27 DIAGNOSIS — K5790 Diverticulosis of intestine, part unspecified, without perforation or abscess without bleeding: Secondary | ICD-10-CM | POA: Diagnosis not present

## 2018-11-27 DIAGNOSIS — Z791 Long term (current) use of non-steroidal anti-inflammatories (NSAID): Secondary | ICD-10-CM | POA: Diagnosis not present

## 2018-11-27 DIAGNOSIS — Z09 Encounter for follow-up examination after completed treatment for conditions other than malignant neoplasm: Secondary | ICD-10-CM | POA: Insufficient documentation

## 2018-11-27 DIAGNOSIS — Z6838 Body mass index (BMI) 38.0-38.9, adult: Secondary | ICD-10-CM | POA: Diagnosis not present

## 2018-11-27 DIAGNOSIS — G894 Chronic pain syndrome: Secondary | ICD-10-CM | POA: Diagnosis not present

## 2018-11-27 HISTORY — PX: COLONOSCOPY WITH PROPOFOL: SHX5780

## 2018-11-27 SURGERY — COLONOSCOPY WITH PROPOFOL
Anesthesia: General

## 2018-11-27 MED ORDER — LIDOCAINE 2% (20 MG/ML) 5 ML SYRINGE
INTRAMUSCULAR | Status: DC | PRN
  Administered 2018-11-27: 100 mg via INTRAVENOUS

## 2018-11-27 MED ORDER — SODIUM CHLORIDE 0.9 % IV SOLN
INTRAVENOUS | Status: DC
  Administered 2018-11-27: 13:00:00 via INTRAVENOUS

## 2018-11-27 MED ORDER — PROPOFOL 500 MG/50ML IV EMUL
INTRAVENOUS | Status: AC
  Filled 2018-11-27: qty 50

## 2018-11-27 MED ORDER — PROPOFOL 500 MG/50ML IV EMUL
INTRAVENOUS | Status: DC | PRN
  Administered 2018-11-27: 150 ug/kg/min via INTRAVENOUS

## 2018-11-27 MED ORDER — PHENYLEPHRINE HCL 10 MG/ML IJ SOLN
INTRAMUSCULAR | Status: DC | PRN
  Administered 2018-11-27: 100 ug via INTRAVENOUS

## 2018-11-27 MED ORDER — PROPOFOL 10 MG/ML IV BOLUS
INTRAVENOUS | Status: DC | PRN
  Administered 2018-11-27: 80 mg via INTRAVENOUS
  Administered 2018-11-27: 20 mg via INTRAVENOUS

## 2018-11-27 NOTE — Transfer of Care (Signed)
Immediate Anesthesia Transfer of Care Note  Patient: LALENA SALAS  Procedure(s) Performed: COLONOSCOPY WITH PROPOFOL (N/A )  Patient Location: Endoscopy Unit  Anesthesia Type:General  Level of Consciousness: sedated  Airway & Oxygen Therapy: Patient connected to nasal cannula oxygen  Post-op Assessment: Post -op Vital signs reviewed and stable  Post vital signs: stable  Last Vitals:  Vitals Value Taken Time  BP 102/57 11/27/2018  1:17 PM  Temp 36.1 C 11/27/2018  1:16 PM  Pulse 68 11/27/2018  1:17 PM  Resp 18 11/27/2018  1:17 PM  SpO2 98 % 11/27/2018  1:17 PM  Vitals shown include unvalidated device data.  Last Pain:  Vitals:   11/27/18 1316  TempSrc: Tympanic  PainSc: Asleep         Complications: No apparent anesthesia complications

## 2018-11-27 NOTE — Anesthesia Post-op Follow-up Note (Signed)
Anesthesia QCDR form completed.        

## 2018-11-27 NOTE — Op Note (Signed)
[facility] Patient Name: Kiara Parker Procedure Date: 11/27/2018 12:22 PM MRN: [id number] Account #: [id number] Date of Birth: 1951/09/09 Admit Type: Outpatient Age: 68 Room: [facility] ENDO ROOM 1 Gender: Female Note Status: Finalized Procedure:            Colonoscopy Indications:          High risk colon cancer surveillance: Personal history                        of colonic polyps Providers:            Jonathon Bellows MD, MD Referring MD:         Franchot Gallo (Referring MD) Medicines:            Monitored Anesthesia Care Complications:        No immediate complications. Procedure:            Pre-Anesthesia Assessment:                       - Prior to the procedure, a History and Physical was                        performed, and patient medications, allergies and                        sensitivities were reviewed. The patient's tolerance of                        previous anesthesia was reviewed.                       - The risks and benefits of the procedure and the                        sedation options and risks were discussed with the                        patient. All questions were answered and informed                        consent was obtained.                       - ASA Grade Assessment: II - A patient with mild                        systemic disease.                       After obtaining informed consent, the colonoscope was                        passed under direct vision. Throughout the procedure,                        the patient's blood pressure, pulse, and oxygen                        saturations were monitored continuously. The                        Colonoscope was  introduced through the anus and                        advanced to the the cecum, identified by the                        appendiceal orifice, IC valve and transillumination.                        The colonoscopy was performed with ease. The patient            tolerated the procedure well. The quality of the bowel                        preparation was good. Findings:      The perianal and digital rectal examinations were normal.      A 3 mm polyp was found in the rectum. The polyp was sessile. The polyp       was removed with a cold biopsy forceps. Resection and retrieval were       complete.      A 6 mm polyp was found in the rectum. The polyp was sessile. The polyp       was removed with a cold snare. Resection and retrieval were complete.      Multiple small-mouthed diverticula were found in the sigmoid colon.      The exam was otherwise without abnormality on direct and retroflexion       views. Impression:           - One 3 mm polyp in the rectum, removed with a cold                        biopsy forceps. Resected and retrieved.                       - One 6 mm polyp in the rectum, removed with a cold                        snare. Resected and retrieved.                       - Diverticulosis in the sigmoid colon.                       - The examination was otherwise normal on direct and                        retroflexion views. Recommendation:       - Discharge patient to home (with escort).                       - Resume previous diet.                       - Continue present medications.                       - Await pathology results.                       - Repeat colonoscopy in 5 years for surveillance. Procedure Code(s):    --- Professional ---  45385, Colonoscopy, flexible; with removal of tumor(s),                        polyp(s), or other lesion(s) by snare technique                       45380, 59, Colonoscopy, flexible; with biopsy, single                        or multiple Diagnosis Code(s):    --- Professional ---                       Z86.010, Personal history of colonic polyps                       K62.1, Rectal polyp                       K57.30, Diverticulosis of large intestine without                         perforation or abscess without bleeding CPT copyright 2018 American Medical Association. All rights reserved. The codes documented in this report are preliminary and upon coder review may  be revised to meet current compliance requirements. Jonathon Bellows, MD Jonathon Bellows MD, MD 11/27/2018 1:16:45 PM This report has been signed electronically. Number of Addenda: 0 Note Initiated On: 11/27/2018 12:22 PM Scope Withdrawal Time: 0 hours 14 minutes 8 seconds  Total Procedure Duration: 0 hours 17 minutes 41 seconds       [facility]

## 2018-11-27 NOTE — Anesthesia Preprocedure Evaluation (Signed)
Anesthesia Evaluation  Patient identified by MRN, date of birth, ID band Patient awake    Reviewed: Allergy & Precautions, H&P , NPO status , Patient's Chart, lab work & pertinent test results, reviewed documented beta blocker date and time   Airway Mallampati: II   Neck ROM: full    Dental  (+) Poor Dentition   Pulmonary neg pulmonary ROS,    Pulmonary exam normal        Cardiovascular Exercise Tolerance: Poor hypertension, On Medications negative cardio ROS Normal cardiovascular exam Rhythm:regular Rate:Normal     Neuro/Psych negative neurological ROS  negative psych ROS   GI/Hepatic negative GI ROS, Neg liver ROS,   Endo/Other  Morbid obesity  Renal/GU negative Renal ROS  negative genitourinary   Musculoskeletal   Abdominal   Peds  Hematology negative hematology ROS (+)   Anesthesia Other Findings Past Medical History: No date: Chronic pain syndrome No date: Hyperlipidemia No date: Hypertension No date: Lumbago No date: Spinal stenosis Past Surgical History: No date: TUBAL LIGATION BMI    Body Mass Index:  38.62 kg/m     Reproductive/Obstetrics negative OB ROS                             Anesthesia Physical Anesthesia Plan  ASA: II  Anesthesia Plan: General   Post-op Pain Management:    Induction:   PONV Risk Score and Plan:   Airway Management Planned:   Additional Equipment:   Intra-op Plan:   Post-operative Plan:   Informed Consent: I have reviewed the patients History and Physical, chart, labs and discussed the procedure including the risks, benefits and alternatives for the proposed anesthesia with the patient or authorized representative who has indicated his/her understanding and acceptance.     Dental Advisory Given  Plan Discussed with: CRNA  Anesthesia Plan Comments:         Anesthesia Quick Evaluation

## 2018-11-27 NOTE — H&P (Signed)
Jonathon Bellows, MD [address] [place], [place], [place], [address] [address], [place], [place], [place], [address] Phone: [phone]  Fax: [phone]  Primary Care Physician:  Venita Lick, NP   Pre-Procedure History & Physical: HPI:  Kiara Parker is a 68 y.o. female is here for an colonoscopy.   Past Medical History:  Diagnosis Date  . Chronic pain syndrome   . Hyperlipidemia   . Hypertension   . Lumbago   . Spinal stenosis     Past Surgical History:  Procedure Laterality Date  . TUBAL LIGATION      Prior to Admission medications   Medication Sig Start Date End Date Taking? Authorizing Provider  baclofen (LIORESAL) 10 MG tablet Take 0.5 tablets (5 mg total) by mouth at bedtime. 10/31/18  Yes Cannady, [name] T, NP  Cholecalciferol 1.25 MG (50000 UT) capsule Take 1 capsule (50,000 Units total) by mouth once a week. 11/01/18  Yes Cannady, [name] T, NP  gabapentin (NEURONTIN) 300 MG capsule TAKE 1 CAPSULE BY MOUTH THREE TIMES A DAY 11/25/18  Yes Cannady, [name] T, NP  hydrochlorothiazide (HYDRODIURIL) 12.5 MG tablet Take 1 tablet (12.5 mg total) by mouth daily. 10/31/18  Yes Cannady, [name] T, NP  meloxicam (MOBIC) 15 MG tablet Take 1 tablet (15 mg total) by mouth daily. 10/31/18  Yes Cannady, [name] T, NP  pravastatin (PRAVACHOL) 20 MG tablet Take 1 tablet (20 mg total) by mouth daily. 10/31/18  Yes Cannady, [name] T, NP  atorvastatin (LIPITOR) 20 MG tablet TAKE 1 TABLET (20 MG TOTAL) BY MOUTH DAILY. Patient not taking: Reported on 10/31/2018 03/20/17   Kathrine Haddock, NP    Allergies as of 11/07/2018 - Review Complete 10/31/2018  Allergen Reaction Noted  . Erythromycin  08/30/2016  . Penicillins  08/30/2016  . Zithromax [azithromycin]  08/30/2016    Family History  Problem Relation Age of Onset  . Cancer Mother        breast  . Arthritis Mother   . Osteoporosis Mother   . Breast cancer Mother   . Heart disease Father   . Lung disease Father   . Cancer Sister         lymphoma  . Cancer Maternal Grandmother        lymphoma  . Multiple sclerosis Sister     Social History   Socioeconomic History  . Marital status: Married    Spouse name: Not on file  . Number of children: Not on file  . Years of education: Not on file  . Highest education level: Not on file  Occupational History  . Not on file  Social Needs  . Financial resource strain: Not on file  . Food insecurity:    Worry: Not on file    Inability: Not on file  . Transportation needs:    Medical: Not on file    Non-medical: Not on file  Tobacco Use  . Smoking status: Never Smoker  . Smokeless tobacco: Never Used  Substance and Sexual Activity  . Alcohol use: No  . Drug use: No  . Sexual activity: Yes    Birth control/protection: None  Lifestyle  . Physical activity:    Days per week: Not on file    Minutes per session: Not on file  . Stress: Not on file  Relationships  . Social connections:    Talks on phone: Not on file    Gets together: Not on file    Attends religious service: Not on  file    Active member of club or organization: Not on file    Attends meetings of clubs or organizations: Not on file    Relationship status: Not on file  . Intimate partner violence:    Fear of current or ex partner: Not on file    Emotionally abused: Not on file    Physically abused: Not on file    Forced sexual activity: Not on file  Other Topics Concern  . Not on file  Social History Narrative  . Not on file    Review of Systems: See HPI, otherwise negative ROS  Physical Exam: BP 133/76   Pulse 75   Temp (!) 97.3 F (36.3 C) (Tympanic)   Resp 15   Ht 5\' 4"  (1.626 m)   Wt 102.1 kg   LMP  (LMP Unknown)   BMI 38.62 kg/m  General:   Alert,  pleasant and cooperative in NAD Head:  Normocephalic and atraumatic. Neck:  Supple; no masses or thyromegaly. Lungs:  Clear throughout to auscultation, normal respiratory effort.    Heart:  +S1, +S2, Regular rate and rhythm, No  edema. Abdomen:  Soft, nontender and nondistended. Normal bowel sounds, without guarding, and without rebound.   Neurologic:  Alert and  oriented x4;  grossly normal neurologically.  Impression/Plan: Kiara Parker is here for an colonoscopy to be performed for surveillance due to prior history of colon polyps   Risks, benefits, limitations, and alternatives regarding  colonoscopy have been reviewed with the patient.  Questions have been answered.  All parties agreeable.   Jonathon Bellows, MD  11/27/2018, 12:46 PM

## 2018-11-28 ENCOUNTER — Encounter: Payer: Self-pay | Admitting: Gastroenterology

## 2018-11-28 LAB — SURGICAL PATHOLOGY

## 2018-11-30 NOTE — Anesthesia Postprocedure Evaluation (Signed)
Anesthesia Post Note  Patient: Kiara Parker  Procedure(s) Performed: COLONOSCOPY WITH PROPOFOL (N/A )  Patient location during evaluation: PACU Anesthesia Type: General Level of consciousness: awake and alert Pain management: pain level controlled Vital Signs Assessment: post-procedure vital signs reviewed and stable Respiratory status: spontaneous breathing, nonlabored ventilation, respiratory function stable and patient connected to nasal cannula oxygen Cardiovascular status: blood pressure returned to baseline and stable Postop Assessment: no apparent nausea or vomiting Anesthetic complications: no     Last Vitals:  Vitals:   11/27/18 1336 11/27/18 1356  BP: 121/64 129/75  Pulse:    Resp:    Temp:    SpO2:      Last Pain:  Vitals:   11/28/18 0739  TempSrc:   PainSc: 0-No pain                 Molli Barrows

## 2018-12-05 ENCOUNTER — Encounter: Payer: Self-pay | Admitting: Nurse Practitioner

## 2018-12-05 ENCOUNTER — Encounter: Payer: Self-pay | Admitting: Gastroenterology

## 2018-12-05 DIAGNOSIS — R7303 Prediabetes: Secondary | ICD-10-CM | POA: Insufficient documentation

## 2018-12-06 ENCOUNTER — Other Ambulatory Visit: Payer: Self-pay

## 2018-12-06 ENCOUNTER — Other Ambulatory Visit (HOSPITAL_COMMUNITY)
Admission: RE | Admit: 2018-12-06 | Discharge: 2018-12-06 | Disposition: A | Discharge status: Home / Self Care | Payer: Medicare HMO | Source: Ambulatory Visit | Attending: Nurse Practitioner | Admitting: Nurse Practitioner

## 2018-12-06 ENCOUNTER — Encounter: Payer: Self-pay | Admitting: Nurse Practitioner

## 2018-12-06 ENCOUNTER — Ambulatory Visit (INDEPENDENT_AMBULATORY_CARE_PROVIDER_SITE_OTHER): Payer: Medicare HMO | Admitting: Nurse Practitioner

## 2018-12-06 VITALS — BP 114/72 | HR 65 | Temp 98.0°F | Ht 64.0 in | Wt 219.0 lb

## 2018-12-06 DIAGNOSIS — Z124 Encounter for screening for malignant neoplasm of cervix: Secondary | ICD-10-CM | POA: Diagnosis not present

## 2018-12-06 DIAGNOSIS — Z1151 Encounter for screening for human papillomavirus (HPV): Secondary | ICD-10-CM | POA: Diagnosis not present

## 2018-12-06 DIAGNOSIS — Z789 Other specified health status: Secondary | ICD-10-CM | POA: Insufficient documentation

## 2018-12-06 DIAGNOSIS — M791 Myalgia, unspecified site: Secondary | ICD-10-CM | POA: Insufficient documentation

## 2018-12-06 DIAGNOSIS — T466X5A Adverse effect of antihyperlipidemic and antiarteriosclerotic drugs, initial encounter: Secondary | ICD-10-CM | POA: Insufficient documentation

## 2018-12-06 NOTE — Patient Instructions (Signed)
Fat and Cholesterol Restricted Eating Plan Getting too much fat and cholesterol in your diet may cause health problems. Choosing the right foods helps keep your fat and cholesterol at normal levels. This can keep you from getting certain diseases. Your doctor may recommend an eating plan that includes:  Total fat: ______% or less of total calories a day.  Saturated fat: ______% or less of total calories a day.  Cholesterol: less than _________mg a day.  Fiber: ______g a day. What are tips for following this plan? Meal planning  At meals, divide your plate into four equal parts: ? Fill one-half of your plate with vegetables and green salads. ? Fill one-fourth of your plate with whole grains. ? Fill one-fourth of your plate with low-fat (lean) protein foods.  Eat fish that is high in omega-3 fats at least two times a week. This includes mackerel, tuna, sardines, and salmon.  Eat foods that are high in fiber, such as whole grains, beans, apples, broccoli, carrots, peas, and barley. General tips   Work with your doctor to lose weight if you need to.  Avoid: ? Foods with added sugar. ? Fried foods. ? Foods with partially hydrogenated oils.  Limit alcohol intake to no more than 1 drink a day for nonpregnant women and 2 drinks a day for men. One drink equals 12 oz of beer, 5 oz of wine, or 1 oz of hard liquor. Reading food labels  Check food labels for: ? Trans fats. ? Partially hydrogenated oils. ? Saturated fat (g) in each serving. ? Cholesterol (mg) in each serving. ? Fiber (g) in each serving.  Choose foods with healthy fats, such as: ? Monounsaturated fats. ? Polyunsaturated fats. ? Omega-3 fats.  Choose grain products that have whole grains. Look for the word "whole" as the first word in the ingredient list. Cooking  Cook foods using low-fat methods. These include baking, boiling, grilling, and broiling.  Eat more home-cooked foods. Eat at restaurants and buffets  less often.  Avoid cooking using saturated fats, such as butter, cream, palm oil, palm kernel oil, and coconut oil. Recommended foods  Fruits  All fresh, canned (in natural juice), or frozen fruits. Vegetables  Fresh or frozen vegetables (raw, steamed, roasted, or grilled). Green salads. Grains  Whole grains, such as whole wheat or whole grain breads, crackers, cereals, and pasta. Unsweetened oatmeal, bulgur, barley, quinoa, or brown rice. Corn or whole wheat flour tortillas. Meats and other protein foods  Ground beef (85% or leaner), grass-fed beef, or beef trimmed of fat. Skinless chicken or turkey. Ground chicken or turkey. Pork trimmed of fat. All fish and seafood. Egg whites. Dried beans, peas, or lentils. Unsalted nuts or seeds. Unsalted canned beans. Nut butters without added sugar or oil. Dairy  Low-fat or nonfat dairy products, such as skim or 1% milk, 2% or reduced-fat cheeses, low-fat and fat-free ricotta or cottage cheese, or plain low-fat and nonfat yogurt. Fats and oils  Tub margarine without trans fats. Light or reduced-fat mayonnaise and salad dressings. Avocado. Olive, canola, sesame, or safflower oils. The items listed above may not be a complete list of foods and beverages you can eat. Contact a dietitian for more information. Foods to avoid Fruits  Canned fruit in heavy syrup. Fruit in cream or butter sauce. Fried fruit. Vegetables  Vegetables cooked in cheese, cream, or butter sauce. Fried vegetables. Grains  White bread. White pasta. White rice. Cornbread. Bagels, pastries, and croissants. Crackers and snack foods that contain trans fat   and hydrogenated oils. Meats and other protein foods  Fatty cuts of meat. Ribs, chicken wings, bacon, sausage, bologna, salami, chitterlings, fatback, hot dogs, bratwurst, and packaged lunch meats. Liver and organ meats. Whole eggs and egg yolks. Chicken and turkey with skin. Fried meat. Dairy  Whole or 2% milk, cream,  half-and-half, and cream cheese. Whole milk cheeses. Whole-fat or sweetened yogurt. Full-fat cheeses. Nondairy creamers and whipped toppings. Processed cheese, cheese spreads, and cheese curds. Beverages  Alcohol. Sugar-sweetened drinks such as sodas, lemonade, and fruit drinks. Fats and oils  Butter, stick margarine, lard, shortening, ghee, or bacon fat. Coconut, palm kernel, and palm oils. Sweets and desserts  Corn syrup, sugars, honey, and molasses. Candy. Jam and jelly. Syrup. Sweetened cereals. Cookies, pies, cakes, donuts, muffins, and ice cream. The items listed above may not be a complete list of foods and beverages you should avoid. Contact a dietitian for more information. Summary  Choosing the right foods helps keep your fat and cholesterol at normal levels. This can keep you from getting certain diseases.  At meals, fill one-half of your plate with vegetables and green salads.  Eat high-fiber foods, like whole grains, beans, apples, carrots, peas, and barley.  Limit added sugar, saturated fats, alcohol, and fried foods. This information is not intended to replace advice given to you by your health care provider. Make sure you discuss any questions you have with your health care provider. Document Released: 03/12/2012 Document Revised: 05/15/2018 Document Reviewed: 05/29/2017 Elsevier Interactive Patient Education  2019 Elsevier Inc.   

## 2018-12-06 NOTE — Progress Notes (Signed)
BP 114/72   Pulse 65   Temp 98 F (36.7 C) (Oral)   Ht 5\' 4"  (1.626 m)   Wt 219 lb (99.3 kg)   LMP  (LMP Unknown)   SpO2 91%   BMI 37.59 kg/m    Subjective:    Patient ID: Kiara Parker, female    DOB: Feb 02, 1951, 68 y.o.   MRN: [id number]  HPI: Kiara Parker is a 68 y.o. female  Chief Complaint  Patient presents with  . Follow-up    pap smear   CERVICAL CANCER SCREENING: Reports she has not had a menstrual cycle in 4 years.  Was last seen by Dr. Amalia Hailey with [facility] 05/25/17 for endometrial polyp.  She had bleeding at that time and she states he told her if she were to start bleeding again they would do a D&C.  She reports no heavy bleeding, only occasional pink spotting.    Relevant past medical, surgical, family and social history reviewed and updated as indicated. Interim medical history since our last visit reviewed. Allergies and medications reviewed and updated.  Review of Systems  Per HPI unless specifically indicated above     Objective:    BP 114/72   Pulse 65   Temp 98 F (36.7 C) (Oral)   Ht 5\' 4"  (1.626 m)   Wt 219 lb (99.3 kg)   LMP  (LMP Unknown)   SpO2 91%   BMI 37.59 kg/m   Wt Readings from Last 3 Encounters:  12/06/18 219 lb (99.3 kg)  11/27/18 225 lb (102.1 kg)  10/31/18 227 lb (103 kg)    Physical Exam Vitals signs and nursing note reviewed.  Constitutional:      Appearance: She is well-developed.  HENT:     Head: Normocephalic.  Eyes:     General:        Right eye: No discharge.        Left eye: No discharge.     Conjunctiva/sclera: Conjunctivae normal.     Pupils: Pupils are equal, round, and reactive to light.  Neck:     Musculoskeletal: Normal range of motion and neck supple.     Thyroid: No thyromegaly.     Vascular: No carotid bruit or JVD.  Cardiovascular:     Rate and Rhythm: Normal rate and regular rhythm.     Heart sounds: Normal heart sounds.  Pulmonary:     Effort: Pulmonary effort is normal.   Breath sounds: Normal breath sounds.  Abdominal:     General: Bowel sounds are normal.     Palpations: Abdomen is soft.  Genitourinary:    Exam position: Lithotomy position.     Vagina: Normal.     Cervix: No cervical motion tenderness, discharge or erythema.     Uterus: Normal.      Adnexa: Right adnexa normal and left adnexa normal.     Rectum: Normal.     Comments: Cervix visualized, slightly posterior.  Scant pink bleeding with procedure noted. Lymphadenopathy:     Cervical: No cervical adenopathy.  Skin:    General: Skin is warm and dry.  Neurological:     Mental Status: She is alert and oriented to person, place, and time.  Psychiatric:        Mood and Affect: Mood normal.        Behavior: Behavior normal.        Thought Content: Thought content normal.        Judgment: Judgment normal.  Results for orders placed or performed during the hospital encounter of 11/27/18  Surgical pathology  Result Value Ref Range   SURGICAL PATHOLOGY      Surgical Pathology CASE: [id number] PATIENT: Vivica Farabee Surgical Pathology Report     SPECIMEN SUBMITTED: A. Rectum polyp x2; cbx(1)/cold snare(1)  CLINICAL HISTORY: None provided  PRE-OPERATIVE DIAGNOSIS: Screening colonoscopy  POST-OPERATIVE DIAGNOSIS: Diverticulosis, colon polyps     DIAGNOSIS: A.  RECTUM POLYP X2; COLD BIOPSY (1) AND COLD SNARE (1): - SERRATED POLYP, CANNOT EXCLUDE EARLY SESSILE SERRATED POLYP (1). - HYPERPLASTIC POLYP (1). - NEGATIVE FOR DYSPLASIA AND MALIGNANCY.  Comment: ``Sessile serrated polyp is synonymous with 2019 WHO new term ``sessile serrated lesion and replaces ``sessile serrated adenoma. The criteria for serrated polyposis syndrome (SPS) have been recently revised. Any histologic subtype of serrated polyp (hyperplastic polyp, sessile serrated polyp with or without high-grade dysplasia, traditional serrated adenoma, and serrated polyp NOS) is included in the final  polyp count. The polyp count is cumulativ e over multiple colonoscopies.  Clinical criteria for the diagnosis of serrated polyposis syndrome (SPS) includes: Criterion 1: At least 5 serrated polyps proximal to rectum, all at least 5 mm in size, with 2 or more measuring at least 10 mm; or  Criterion 2: More than 20 serrated polyps of any size distributed throughout the large bowel, with at least 5 located proximal to the rectum.  References: 2019 WHO Classification of Digestive System Tumors, 5th Edition.  GROSS DESCRIPTION: A. Labeled: C BX polyp x1/cold snare polyp x1 rectum Received: Formalin Tissue fragment(s): 2 Size: 0.4 cm Description: Tan soft tissue fragments Entirely submitted in 1 cassette.   Final Diagnosis performed by Quay Burow, MD.   Electronically signed 11/28/2018 2:51:56PM The electronic signature indicates that the named Attending Pathologist has evaluated the specimen  Technical component performed at [facility], [address] [place], [place] [address] Lab: [phone] Dir: Tracie Harrier, MD, MMM  Professional component performed at [facility], [facility], [place], [place], [place] [address] Lab: [phone] Dir: Dellia Nims. Reuel Derby, MD       Assessment & Plan:   Problem List Items Addressed This Visit    None    Visit Diagnoses    Cervical cancer screening    -  Primary   Pap smear performed and sent.   Relevant Orders   Cytology - PAP       Follow up plan: Return in about 6 months (around 06/08/2019) for HLD.

## 2018-12-10 LAB — CYTOLOGY - PAP
Diagnosis: NEGATIVE
HPV: NOT DETECTED

## 2018-12-16 DIAGNOSIS — E782 Mixed hyperlipidemia: Secondary | ICD-10-CM | POA: Diagnosis not present

## 2018-12-16 DIAGNOSIS — R0602 Shortness of breath: Secondary | ICD-10-CM | POA: Diagnosis not present

## 2018-12-16 DIAGNOSIS — I10 Essential (primary) hypertension: Secondary | ICD-10-CM | POA: Diagnosis not present

## 2018-12-21 ENCOUNTER — Other Ambulatory Visit: Payer: Self-pay | Admitting: Nurse Practitioner

## 2018-12-21 NOTE — Telephone Encounter (Signed)
Please advise, looks like on result note from [facility], the pt was only taking this for a short course

## 2019-02-14 ENCOUNTER — Other Ambulatory Visit: Payer: Self-pay | Admitting: Nurse Practitioner

## 2019-03-03 DIAGNOSIS — I10 Essential (primary) hypertension: Secondary | ICD-10-CM | POA: Diagnosis not present

## 2019-03-03 DIAGNOSIS — I119 Hypertensive heart disease without heart failure: Secondary | ICD-10-CM | POA: Diagnosis not present

## 2019-03-03 DIAGNOSIS — Z6837 Body mass index (BMI) 37.0-37.9, adult: Secondary | ICD-10-CM | POA: Diagnosis not present

## 2019-03-03 DIAGNOSIS — E782 Mixed hyperlipidemia: Secondary | ICD-10-CM | POA: Diagnosis not present

## 2019-03-03 DIAGNOSIS — R0602 Shortness of breath: Secondary | ICD-10-CM | POA: Diagnosis not present

## 2019-05-03 ENCOUNTER — Other Ambulatory Visit: Payer: Self-pay | Admitting: Nurse Practitioner

## 2019-05-03 NOTE — Telephone Encounter (Signed)
Requested Prescriptions  Pending Prescriptions Disp Refills  . meloxicam (MOBIC) 15 MG tablet [Pharmacy Med Name: MELOXICAM 15 MG TABLET] 30 tablet 0    Sig: TAKE 1 TABLET BY MOUTH EVERY DAY     Analgesics:  COX2 Inhibitors Passed - 05/03/2019  9:27 AM      Passed - HGB in normal range and within 360 days    Hemoglobin  Date Value Ref Range Status  10/31/2018 13.6 11.1 - 15.9 g/dL Final         Passed - Cr in normal range and within 360 days    Creatinine, Ser  Date Value Ref Range Status  10/31/2018 0.77 0.57 - 1.00 mg/dL Final         Passed - Patient is not pregnant      Passed - Valid encounter within last 12 months    Recent Outpatient Visits          4 months ago Cervical cancer screening   [place], Barbaraann Faster, NP   6 months ago Essential hypertension   [place], Harwick T, NP   2 years ago Annual physical exam   [facility] Kathrine Haddock, NP   2 years ago Postmenopausal vaginal bleeding   [facility] Kathrine Haddock, NP   2 years ago Need for influenza vaccination   [facility] Kathrine Haddock, NP      Future Appointments            In 1 month Cannady, Barbaraann Faster, NP [employer]

## 2019-05-22 ENCOUNTER — Telehealth: Payer: Self-pay | Admitting: Nurse Practitioner

## 2019-05-22 NOTE — Chronic Care Management (AMB) (Signed)
Chronic Care Management   Note  05/22/2019 Name: Kiara Parker MRN: [id number] DOB: 1950-10-04  Kiara Parker is a 68 y.o. year old female who is a primary care patient of Cannady, Barbaraann Faster, NP. I reached out to Kiara Parker by phone today in response to a referral sent by Kiara Parker health plan.    Kiara Parker was given information about Chronic Care Management services today including:  1. CCM service includes personalized support from designated clinical staff supervised by her physician, including individualized plan of care and coordination with other care providers 2. 24/7 contact phone numbers for assistance for urgent and routine care needs. 3. Service will only be billed when office clinical staff spend 20 minutes or more in a month to coordinate care. 4. Only one practitioner may furnish and bill the service in a calendar month. 5. The patient may stop CCM services at any time (effective at the end of the month) by phone call to the office staff. 6. The patient will be responsible for cost sharing (co-pay) of up to 20% of the service fee (after annual deductible is met).  Patient did not agree to enrollment in care management services and does not wish to consider at this time.  Follow up plan: The patient has been provided with contact information for the chronic care management team and has been advised to call with any health related questions or concerns.   [place]  [email or web address]   ??[phone]

## 2019-05-29 ENCOUNTER — Other Ambulatory Visit: Payer: Self-pay | Admitting: Nurse Practitioner

## 2019-06-11 ENCOUNTER — Ambulatory Visit: Payer: Medicare HMO | Admitting: Nurse Practitioner

## 2019-06-16 ENCOUNTER — Encounter: Payer: Self-pay | Admitting: Nurse Practitioner

## 2019-06-16 ENCOUNTER — Other Ambulatory Visit: Payer: Self-pay

## 2019-06-16 ENCOUNTER — Ambulatory Visit (INDEPENDENT_AMBULATORY_CARE_PROVIDER_SITE_OTHER): Payer: Medicare HMO | Admitting: Nurse Practitioner

## 2019-06-16 VITALS — BP 119/80 | HR 83 | Temp 97.8°F

## 2019-06-16 DIAGNOSIS — R7303 Prediabetes: Secondary | ICD-10-CM

## 2019-06-16 DIAGNOSIS — E782 Mixed hyperlipidemia: Secondary | ICD-10-CM

## 2019-06-16 MED ORDER — ROSUVASTATIN CALCIUM 5 MG PO TABS: ORAL_TABLET | ORAL | 3 refills | Status: DC

## 2019-06-16 NOTE — Assessment & Plan Note (Signed)
Check A1C outpatient, as is in rush this morning and can not obtain.  Will adjust plan of care and add medication as needed.

## 2019-06-16 NOTE — Assessment & Plan Note (Signed)
Chronic, ongoing.  Is tolerating Crestor on 3 day week schedule.  Will continue this, refills sent.  Obtain labs outpatient, as she is in rush this morning.  Adjust medication as needed.

## 2019-06-16 NOTE — Patient Instructions (Signed)
[facility]  Address: [address], [place], [place] [address]  Phone: [phone]   Mammogram A mammogram is an X-ray of the breasts that is done to check for changes that are not normal. This test can screen for and find any changes that may suggest breast cancer. Mammograms are regularly done on women. A man may have a mammogram if he has a lump or swelling in his breast. This test can also help to find other changes and variations in the breast. Tell a doctor:  About any allergies you have.  If you have breast implants.  If you have had previous breast disease, biopsy, or surgery.  If you are breastfeeding.  If you are younger than age 30.  If you have a family history of breast cancer.  Whether you are pregnant or may be pregnant. What are the risks? Generally, this is a safe procedure. However, problems may occur, including:  Exposure to radiation. Radiation levels are very low with this test.  The results being misinterpreted.  The need for further tests.  The inability of the mammogram to detect certain cancers. What happens before the procedure?  Have this test done about 1-2 weeks after your period. This is usually when your breasts are the least tender.  If you are visiting a new doctor or clinic, send any past mammogram images to your new doctor's office.  Wash your breasts and under your arms the day of the test.  Do not use deodorants, perfumes, lotions, or powders on the day of the test.  Take off any jewelry from your neck.  Wear clothes that you can change into and out of easily. What happens during the procedure?   You will undress from the waist up. You will put on a gown.  You will stand in front of the X-ray machine.  Each breast will be placed between two plastic or glass plates. The plates will press down on your breast for a few seconds. Try to stay as relaxed as possible. This does not cause any  harm to your breasts. Any discomfort you feel will be very brief.  X-rays will be taken from different angles of each breast. The procedure may vary among doctors and hospitals. What happens after the procedure?  The mammogram will be read by a specialist (radiologist).  You may need to do certain parts of the test again. This depends on the quality of the images.  Ask when your test results will be ready. Make sure you get your test results.  You may go back to your normal activities. Summary  A mammogram is a low energy X-ray of the breasts that is done to check for abnormal changes. A man may have this test if he has a lump or swelling in his breast.  Before the procedure, tell your doctor about any breast problems that you have had in the past.  Have this test done about 1-2 weeks after your period.  For the test, each breast will be placed between two plastic or glass plates. The plates will press down on your breast for a few seconds.  The mammogram will be read by a specialist (radiologist). Ask when your test results will be ready. Make sure you get your test results. This information is not intended to replace advice given to you by your health care provider. Make sure you discuss any questions you have with your health care provider. Document Released: 12/08/2008 Document  Revised: 05/02/2018 Document Reviewed: 05/02/2018 Elsevier Patient Education  [place].

## 2019-06-16 NOTE — Progress Notes (Signed)
BP 119/80   Pulse 83   Temp 97.8 F (36.6 C) (Oral)   LMP  (LMP Unknown)   SpO2 97%    Subjective:    Patient ID: Kiara Parker, female    DOB: May 06, 1951, 68 y.o.   MRN: [id number]  HPI: Kiara Parker is a 68 y.o. female  Chief Complaint  Patient presents with  . Hyperlipidemia   HYPERLIPIDEMIA Continues on Crestor 5 MG three days a week.  Followed by cardiology, Dr. Nehemiah Massed, and last seen 03/03/2019. She states the Crestor on three day schedule is working better and less side effects. Hyperlipidemia status: good compliance Satisfied with current treatment?  no Side effects:  no Medication compliance: good compliance Past cholesterol meds: Lipitor and Pravastatin Supplements: none Aspirin:  yes The 10-year ASCVD risk score Mikey Bussing DC Jr., et al., 2013) is: 8.3%   Values used to calculate the score:     Age: 34 years     Sex: Female     Is Non-Hispanic African American: No     Diabetic: No     Tobacco smoker: No     Systolic Blood Pressure: 123456 mmHg     Is BP treated: Yes     HDL Cholesterol: 47 mg/dL     Total Cholesterol: 237 mg/dL Chest pain:  no Coronary artery disease:  no Family history CAD:  no Family history early CAD:  no   PREDIABETES: A1C in February was 6.0%.  Diet focused at this time, although she reports not following diet well.  Has been hanging out with her mom and sister.  Her mom has pancreatic cancer and they eat whatever she likes. Polydipsia/polyuria: no Visual disturbance: no Chest pain: no Paresthesias: no  Relevant past medical, surgical, family and social history reviewed and updated as indicated. Interim medical history since our last visit reviewed. Allergies and medications reviewed and updated.  Review of Systems  Constitutional: Negative for activity change, appetite change, diaphoresis, fatigue and fever.  Respiratory: Negative for cough, chest tightness and shortness of breath.   Cardiovascular: Negative for chest pain,  palpitations and leg swelling.  Gastrointestinal: Negative for abdominal distention, abdominal pain, constipation, diarrhea, nausea and vomiting.  Endocrine: Negative for cold intolerance, heat intolerance, polydipsia, polyphagia and polyuria.  Neurological: Negative for dizziness, syncope, weakness, light-headedness, numbness and headaches.  Psychiatric/Behavioral: Negative.     Per HPI unless specifically indicated above     Objective:    BP 119/80   Pulse 83   Temp 97.8 F (36.6 C) (Oral)   LMP  (LMP Unknown)   SpO2 97%   Wt Readings from Last 3 Encounters:  12/06/18 219 lb (99.3 kg)  11/27/18 225 lb (102.1 kg)  10/31/18 227 lb (103 kg)    Physical Exam Vitals signs and nursing note reviewed.  Constitutional:      General: She is awake. She is not in acute distress.    Appearance: She is well-developed. She is obese. She is not ill-appearing.  HENT:     Head: Normocephalic.     Right Ear: Hearing normal.     Left Ear: Hearing normal.  Eyes:     General: Lids are normal.        Right eye: No discharge.        Left eye: No discharge.     Conjunctiva/sclera: Conjunctivae normal.     Pupils: Pupils are equal, round, and reactive to light.  Neck:     Musculoskeletal: Normal range of motion  and neck supple.     Vascular: No carotid bruit.  Cardiovascular:     Rate and Rhythm: Normal rate and regular rhythm.     Heart sounds: Normal heart sounds. No murmur. No gallop.   Pulmonary:     Effort: Pulmonary effort is normal. No accessory muscle usage or respiratory distress.     Breath sounds: Normal breath sounds.  Abdominal:     General: Bowel sounds are normal.     Palpations: Abdomen is soft.  Musculoskeletal:     Right lower leg: No edema.     Left lower leg: No edema.  Skin:    General: Skin is warm and dry.  Neurological:     Mental Status: She is alert and oriented to person, place, and time.  Psychiatric:        Attention and Perception: Attention normal.         Mood and Affect: Mood normal.        Behavior: Behavior normal. Behavior is cooperative.        Thought Content: Thought content normal.        Judgment: Judgment normal.     Results for orders placed or performed in visit on 12/06/18  Cytology - PAP  Result Value Ref Range   Adequacy      Satisfactory for evaluation. The presence or absence of an endocervical / transformation zone component cannot be determined because of atrophy.   Diagnosis      NEGATIVE FOR INTRAEPITHELIAL LESIONS OR MALIGNANCY.   HPV NOT DETECTED    Material Submitted CervicoVaginal Pap [ThinPrep Imaged]       Assessment & Plan:   Problem List Items Addressed This Visit      Other   Hyperlipidemia    Chronic, ongoing.  Is tolerating Crestor on 3 day week schedule.  Will continue this, refills sent.  Obtain labs outpatient, as she is in rush this morning.  Adjust medication as needed.      Relevant Medications   rosuvastatin (CRESTOR) 5 MG tablet   Other Relevant Orders   Comprehensive metabolic panel   Lipid Panel w/o Chol/HDL Ratio   Prediabetes - Primary    Check A1C outpatient, as is in rush this morning and can not obtain.  Will adjust plan of care and add medication as needed.      Relevant Orders   HgB A1c       Follow up plan: Return in about 6 months (around 12/14/2019) for HTN/HLD, Prediabetes, HF --- also needs outpatient lab visit for Wednesday morning.

## 2019-06-18 ENCOUNTER — Other Ambulatory Visit: Payer: Self-pay

## 2019-06-18 ENCOUNTER — Other Ambulatory Visit: Payer: Medicare HMO

## 2019-06-18 DIAGNOSIS — E782 Mixed hyperlipidemia: Secondary | ICD-10-CM | POA: Diagnosis not present

## 2019-06-18 DIAGNOSIS — R7303 Prediabetes: Secondary | ICD-10-CM | POA: Diagnosis not present

## 2019-06-19 ENCOUNTER — Other Ambulatory Visit: Payer: Self-pay | Admitting: Nurse Practitioner

## 2019-06-19 LAB — COMPREHENSIVE METABOLIC PANEL
ALT: 30 IU/L (ref 0–32)
AST: 23 IU/L (ref 0–40)
Albumin/Globulin Ratio: 1.6 (ref 1.2–2.2)
Albumin: 4.1 g/dL (ref 3.8–4.8)
Alkaline Phosphatase: 77 IU/L (ref 39–117)
BUN/Creatinine Ratio: 20 (ref 12–28)
BUN: 15 mg/dL (ref 8–27)
Bilirubin Total: 0.4 mg/dL (ref 0.0–1.2)
CO2: 28 mmol/L (ref 20–29)
Calcium: 9.4 mg/dL (ref 8.7–10.3)
Chloride: 100 mmol/L (ref 96–106)
Creatinine, Ser: 0.75 mg/dL (ref 0.57–1.00)
GFR calc Af Amer: 96 mL/min/{1.73_m2} (ref >59)
GFR calc non Af Amer: 83 mL/min/{1.73_m2} (ref >59)
Globulin, Total: 2.5 g/dL (ref 1.5–4.5)
Glucose: 101 mg/dL — ABNORMAL HIGH (ref 65–99)
Potassium: 4 mmol/L (ref 3.5–5.2)
Sodium: 142 mmol/L (ref 134–144)
Total Protein: 6.6 g/dL (ref 6.0–8.5)

## 2019-06-19 LAB — LIPID PANEL W/O CHOL/HDL RATIO
Cholesterol, Total: 194 mg/dL (ref 100–199)
HDL: 47 mg/dL (ref >39)
LDL Chol Calc (NIH): 120 mg/dL — ABNORMAL HIGH (ref 0–99)
Triglycerides: 154 mg/dL — ABNORMAL HIGH (ref 0–149)
VLDL Cholesterol Cal: 27 mg/dL (ref 5–40)

## 2019-06-19 LAB — HEMOGLOBIN A1C
Est. average glucose Bld gHb Est-mCnc: 123 mg/dL
Hgb A1c MFr Bld: 5.9 % — ABNORMAL HIGH (ref 4.8–5.6)

## 2019-06-19 NOTE — Telephone Encounter (Signed)
Forwarding medication refill request to PCP for review. 

## 2019-06-22 ENCOUNTER — Other Ambulatory Visit: Payer: Self-pay | Admitting: Nurse Practitioner

## 2019-07-21 ENCOUNTER — Ambulatory Visit (INDEPENDENT_AMBULATORY_CARE_PROVIDER_SITE_OTHER): Payer: Medicare HMO

## 2019-07-21 VITALS — Temp 97.3°F | Ht 64.0 in | Wt 220.0 lb

## 2019-07-21 DIAGNOSIS — Z0000 Encounter for general adult medical examination without abnormal findings: Secondary | ICD-10-CM | POA: Diagnosis not present

## 2019-07-21 NOTE — Progress Notes (Signed)
Subjective:   Kiara Parker is a 68 y.o. female who presents for an Initial Medicare Annual Wellness Visit.  This visit is being conducted via phone call  - after an attempt to do on video chat - due to the COVID-19 pandemic. This patient has given me verbal consent via phone to conduct this visit, patient states they are participating from their home address. Some vital signs may be absent or patient reported.   Patient identification: identified by name, DOB, and current address.    Review of Systems:      Cardiac Risk Factors include: advanced age (>62men, >27 women);dyslipidemia;hypertension;obesity (BMI >30kg/m2)     Objective:    Today's Vitals   07/21/19 1348  Temp: (!) 97.3 F (36.3 C)  Weight: 220 lb (99.8 kg)  Height: 5\' 4"  (1.626 m)   Body mass index is 37.76 kg/m.  Advanced Directives 11/27/2018 12/04/2017 11/13/2016  Does Patient Have a Medical Advance Directive? No No No  Would patient like information on creating a medical advance directive? No - Patient declined - -    Current Medications (verified) Outpatient Encounter Medications as of 07/21/2019  Medication Sig  . baclofen (LIORESAL) 10 MG tablet TAKE 0.5 TABLETS (5 MG TOTAL) BY MOUTH AT BEDTIME.  Marland Kitchen gabapentin (NEURONTIN) 300 MG capsule TAKE 1 CAPSULE BY MOUTH THREE TIMES A DAY  . hydrochlorothiazide (HYDRODIURIL) 12.5 MG tablet Take 1 tablet (12.5 mg total) by mouth daily.  . meloxicam (MOBIC) 15 MG tablet TAKE 1 TABLET BY MOUTH EVERY DAY  . rosuvastatin (CRESTOR) 5 MG tablet TAKE 1 TABLET (5 MG TOTAL) BY MOUTH 3 (THREE) TIMES A WEEK  . Cholecalciferol (VITAMIN D3) 1.25 MG (50000 UT) CAPS TAKE 1 CAPSULE BY MOUTH ONE TIME PER WEEK (Patient not taking: Reported on 07/21/2019)   No facility-administered encounter medications on file as of 07/21/2019.     Allergies (verified) Atorvastatin, Erythromycin, Penicillins, Pravastatin, and Zithromax [azithromycin]   History: Past Medical History:   Diagnosis Date  . Chronic pain syndrome   . Hyperlipidemia   . Hypertension   . Lumbago   . Spinal stenosis    Past Surgical History:  Procedure Laterality Date  . COLONOSCOPY WITH PROPOFOL N/A 11/27/2018   Procedure: COLONOSCOPY WITH PROPOFOL;  Surgeon: Jonathon Bellows, MD;  Location: [facility] ENDOSCOPY;  Service: Gastroenterology;  Laterality: N/A;  . TUBAL LIGATION     Family History  Problem Relation Age of Onset  . Cancer Mother        breast  . Arthritis Mother   . Osteoporosis Mother   . Breast cancer Mother   . Heart disease Father   . Lung disease Father   . Cancer Sister        lymphoma  . Cancer Maternal Grandmother        lymphoma  . Multiple sclerosis Sister    Social History   Socioeconomic History  . Marital status: Married    Spouse name: Not on file  . Number of children: Not on file  . Years of education: Not on file  . Highest education level: Not on file  Occupational History  . Occupation: retired  Scientific laboratory technician  . Financial resource strain: Not hard at all  . Food insecurity    Worry: Never true    Inability: Never true  . Transportation needs    Medical: No    Non-medical: No  Tobacco Use  . Smoking status: Never Smoker  . Smokeless tobacco: Never Used  Substance  and Sexual Activity  . Alcohol use: No  . Drug use: No  . Sexual activity: Yes    Birth control/protection: None  Lifestyle  . Physical activity    Days per week: 0 days    Minutes per session: 0 min  . Stress: Not at all  Relationships  . Social Herbalist on phone: Three times a week    Gets together: More than three times a week    Attends religious service: Not on file    Active member of club or organization: No    Attends meetings of clubs or organizations: Never    Relationship status: Married  Other Topics Concern  . Not on file  Social History Narrative  . Not on file    Tobacco Counseling Counseling given: Not Answered   Clinical Intake:   Pre-visit preparation completed: Yes  Pain : No/denies pain     Nutritional Status: BMI > 30  Obese Nutritional Risks: None Diabetes: No  How often do you need to have someone help you when you read instructions, pamphlets, or other written materials from your doctor or pharmacy?: 1 - Never  Interpreter Needed?: No  Information entered by :: Alyzabeth Pontillo,LPN   Activities of Daily Living In your present state of health, do you have any difficulty performing the following activities: 07/21/2019 10/31/2018  Hearing? N N  Comment no hearing aids, ringing in ears -  Vision? N N  Comment reading glasses, goes to [facility] -  Difficulty concentrating or making decisions? N N  Walking or climbing stairs? Y Y  Dressing or bathing? N N  Doing errands, shopping? N N  Preparing Food and eating ? N -  Using the Toilet? N -  In the past six months, have you accidently leaked urine? Y -  Comment wears pads for protection -  Do you have problems with loss of bowel control? N -  Managing your Medications? N -  Managing your Finances? N -  Housekeeping or managing your Housekeeping? N -  Some recent data might be hidden     Immunizations and Health Maintenance Immunization History  Administered Date(s) Administered  . Influenza, High Dose Seasonal PF 07/24/2018, 06/27/2019  . Influenza,inj,Quad PF,6+ Mos 09/12/2016  . Pneumococcal Conjugate-13 07/24/2018  . Tdap 09/12/2016   Health Maintenance Due  Topic Date Due  . Hepatitis C Screening  Jun 16, 1951  . DEXA SCAN  08/14/2017  . MAMMOGRAM  10/13/2018    Patient Care Team: Venita Lick, NP as PCP - General (Nurse Practitioner)  Indicate any recent Medical Services you may have received from other than [facility] providers in the past year (date may be approximate).     Assessment:   This is a routine wellness examination for [name].  Hearing/Vision screen No exam data present  Dietary issues and exercise activities discussed:  Current Exercise Habits: The patient does not participate in regular exercise at present(stays active), Exercise limited by: None identified  Goals   None    Depression Screen PHQ 2/9 Scores 07/21/2019 06/16/2019 03/13/2017  PHQ - 2 Score 0 0 0  PHQ- 9 Score - 1 5    Fall Risk Fall Risk  07/21/2019 03/13/2017  Falls in the past year? 0 No  Number falls in past yr: 0 -  Injury with Fall? 0 -   FALL RISK PREVENTION PERTAINING TO THE HOME:  Any stairs in or around the home? Yes  If so, are there any without  handrails? No   Home free of loose throw rugs in walkways, pet beds, electrical cords, etc? Yes  Adequate lighting in your home to reduce risk of falls? Yes   ASSISTIVE DEVICES UTILIZED TO PREVENT FALLS:  Life alert? No  Use of a cane, walker or w/c? No  Grab bars in the bathroom? No  Shower chair or bench in shower? No  Elevated toilet seat or a handicapped toilet? No    DME ORDERS:  DME order needed?  No   TIMED UP AND GO:  Unable to perform    Cognitive Function:        Screening Tests Health Maintenance  Topic Date Due  . Hepatitis C Screening  08/06/1951  . DEXA SCAN  08/14/2017  . MAMMOGRAM  10/13/2018  . INFLUENZA VACCINE  07/27/2019 (Originally 04/26/2019)  . PNA vac Low Risk Adult (2 of 2 - PPSV23) 07/25/2019  . COLONOSCOPY  11/27/2023  . TETANUS/TDAP  09/12/2026    Qualifies for Shingles Vaccine? Yes  Zostavax completed n/a. Due for Shingrix. Education has been provided regarding the importance of this vaccine. Pt has been advised to call insurance company to determine out of pocket expense. Advised may also receive vaccine at local pharmacy or Health Dept. Verbalized acceptance and understanding.  Tdap: up to date  Flu Vaccine: up to date  Pneumococcal Vaccine: Due for Pneumococcal vaccine 07/25/2019   Cancer Screenings:  Colorectal Screening: Completed 11/27/2018. Repeat every 5 years  Mammogram: Completed 10/13/2016. Repeat every year   Bone Density: ordered 10/31/2018  Lung Cancer Screening: (Low Dose CT Chest recommended if Age 71-80 years, 30 pack-year currently smoking OR have quit w/in 15years.) does not qualify.   Additional Screening:  Hepatitis C Screening: does qualify  Vision Screening: Recommended annual ophthalmology exams for early detection of glaucoma and other disorders of the eye. Is the patient up to date with their annual eye exam?  Yes  Who is the provider or what is the name of the office in which the pt attends annual eye exams? Dr.[facility]   Dental Screening: Recommended annual dental exams for proper oral hygiene  Community Resource Referral:  CRR required this visit?  No       Plan:  I have personally reviewed and addressed the Medicare Annual Wellness questionnaire and have noted the following in the patient's chart:  A. Medical and social history B. Use of alcohol, tobacco or illicit drugs  C. Current medications and supplements D. Functional ability and status E.  Nutritional status F.  Physical activity G. Advance directives H. List of other physicians I.  Hospitalizations, surgeries, and ER visits in previous 12 months J.  [place] such as hearing and vision if needed, cognitive and depression L. Referrals and appointments   In addition, I have reviewed and discussed with patient certain preventive protocols, quality metrics, and best practice recommendations. A written personalized care plan for preventive services as well as general preventive health recommendations were provided to patient.   Signed,    Bevelyn Ngo, LPN   [id number]  Nurse Health Advisor   Nurse Notes: Patient is out of Vitamin D3, does she need a refill?

## 2019-07-21 NOTE — Patient Instructions (Addendum)
Kiara Parker , Thank you for taking time to come for your Medicare Wellness Visit. I appreciate your ongoing commitment to your health goals. Please review the following plan we discussed and let me know if I can assist you in the future.   Screening recommendations/referrals: Colonoscopy: completed 11/2018 Mammogram: Please call [phone] to schedule your mammogram.  Bone Density: Please call [phone] to schedule  Recommended yearly ophthalmology/optometry visit for glaucoma screening and checkup Recommended yearly dental visit for hygiene and checkup  Vaccinations: Influenza vaccine: up to date Pneumococcal vaccine: pneumovax 23 due 07/25/2019 Tdap vaccine: up to date Shingles vaccine: shingrix eligible     Advanced directives: Please pick up a copy of this information next time you come into the office   Conditions/risks identified: please let us know if you need anything  Next appointment: Follow up in one year for your annual wellness visit.    Preventive Care 29 Years and Older, Female Preventive care refers to lifestyle choices and visits with your health care provider that can promote health and wellness. What does preventive care include?  A yearly physical exam. This is also called an annual well check.  Dental exams once or twice a year.  Routine eye exams. Ask your health care provider how often you should have your eyes checked.  Personal lifestyle choices, including:  Daily care of your teeth and gums.  Regular physical activity.  Eating a healthy diet.  Avoiding tobacco and drug use.  Limiting alcohol use.  Practicing safe sex.  Taking low-dose aspirin every day.  Taking vitamin and mineral supplements as recommended by your health care provider. What happens during an annual well check? The services and screenings done by your health care provider during your annual well check will depend on your age, overall health, lifestyle risk factors, and  family history of disease. Counseling  Your health care provider may ask you questions about your:  Alcohol use.  Tobacco use.  Drug use.  Emotional well-being.  Home and relationship well-being.  Sexual activity.  Eating habits.  History of falls.  Memory and ability to understand (cognition).  Work and work Statistician.  Reproductive health. Screening  You may have the following tests or measurements:  Height, weight, and BMI.  Blood pressure.  Lipid and cholesterol levels. These may be checked every 5 years, or more frequently if you are over 27 years old.  Skin check.  Lung cancer screening. You may have this screening every year starting at age 34 if you have a 30-pack-year history of smoking and currently smoke or have quit within the past 15 years.  Fecal occult blood test (FOBT) of the stool. You may have this test every year starting at age 34.  Flexible sigmoidoscopy or colonoscopy. You may have a sigmoidoscopy every 5 years or a colonoscopy every 10 years starting at age 75.  Hepatitis C blood test.  Hepatitis B blood test.  Sexually transmitted disease (STD) testing.  Diabetes screening. This is done by checking your blood sugar (glucose) after you have not eaten for a while (fasting). You may have this done every 1-3 years.  Bone density scan. This is done to screen for osteoporosis. You may have this done starting at age 78.  Mammogram. This may be done every 1-2 years. Talk to your health care provider about how often you should have regular mammograms. Talk with your health care provider about your test results, treatment options, and if necessary, the need for more tests. Vaccines  Your health care provider may recommend certain vaccines, such as:  Influenza vaccine. This is recommended every year.  Tetanus, diphtheria, and acellular pertussis (Tdap, Td) vaccine. You may need a Td booster every 10 years.  Zoster vaccine. You may need this  after age 65.  Pneumococcal 13-valent conjugate (PCV13) vaccine. One dose is recommended after age 42.  Pneumococcal polysaccharide (PPSV23) vaccine. One dose is recommended after age 93. Talk to your health care provider about which screenings and vaccines you need and how often you need them. This information is not intended to replace advice given to you by your health care provider. Make sure you discuss any questions you have with your health care provider. Document Released: 10/08/2015 Document Revised: 05/31/2016 Document Reviewed: 07/13/2015 Elsevier Interactive Patient Education  2017 [place] Prevention in the Home Falls can cause injuries. They can happen to people of all ages. There are many things you can do to make your home safe and to help prevent falls. What can I do on the outside of my home?  Regularly fix the edges of walkways and driveways and fix any cracks.  Remove anything that might make you trip as you walk through a door, such as a raised step or threshold.  Trim any bushes or trees on the path to your home.  Use bright outdoor lighting.  Clear any walking paths of anything that might make someone trip, such as rocks or tools.  Regularly check to see if handrails are loose or broken. Make sure that both sides of any steps have handrails.  Any raised decks and porches should have guardrails on the edges.  Have any leaves, snow, or ice cleared regularly.  Use sand or salt on walking paths during winter.  Clean up any spills in your garage right away. This includes oil or grease spills. What can I do in the bathroom?  Use night lights.  Install grab bars by the toilet and in the tub and shower. Do not use towel bars as grab bars.  Use non-skid mats or decals in the tub or shower.  If you need to sit down in the shower, use a plastic, non-slip stool.  Keep the floor dry. Clean up any water that spills on the floor as soon as it happens.   Remove soap buildup in the tub or shower regularly.  Attach bath mats securely with double-sided non-slip rug tape.  Do not have throw rugs and other things on the floor that can make you trip. What can I do in the bedroom?  Use night lights.  Make sure that you have a light by your bed that is easy to reach.  Do not use any sheets or blankets that are too big for your bed. They should not hang down onto the floor.  Have a firm chair that has side arms. You can use this for support while you get dressed.  Do not have throw rugs and other things on the floor that can make you trip. What can I do in the kitchen?  Clean up any spills right away.  Avoid walking on wet floors.  Keep items that you use a lot in easy-to-reach places.  If you need to reach something above you, use a strong step stool that has a grab bar.  Keep electrical cords out of the way.  Do not use floor polish or wax that makes floors slippery. If you must use wax, use non-skid floor wax.  Do  not have throw rugs and other things on the floor that can make you trip. What can I do with my stairs?  Do not leave any items on the stairs.  Make sure that there are handrails on both sides of the stairs and use them. Fix handrails that are broken or loose. Make sure that handrails are as long as the stairways.  Check any carpeting to make sure that it is firmly attached to the stairs. Fix any carpet that is loose or worn.  Avoid having throw rugs at the top or bottom of the stairs. If you do have throw rugs, attach them to the floor with carpet tape.  Make sure that you have a light switch at the top of the stairs and the bottom of the stairs. If you do not have them, ask someone to add them for you. What else can I do to help prevent falls?  Wear shoes that:  Do not have high heels.  Have rubber bottoms.  Are comfortable and fit you well.  Are closed at the toe. Do not wear sandals.  If you use a  stepladder:  Make sure that it is fully opened. Do not climb a closed stepladder.  Make sure that both sides of the stepladder are locked into place.  Ask someone to hold it for you, if possible.  Clearly mark and make sure that you can see:  Any grab bars or handrails.  First and last steps.  Where the edge of each step is.  Use tools that help you move around (mobility aids) if they are needed. These include:  Canes.  Walkers.  Scooters.  Crutches.  Turn on the lights when you go into a dark area. Replace any light bulbs as soon as they burn out.  Set up your furniture so you have a clear path. Avoid moving your furniture around.  If any of your floors are uneven, fix them.  If there are any pets around you, be aware of where they are.  Review your medicines with your doctor. Some medicines can make you feel dizzy. This can increase your chance of falling. Ask your doctor what other things that you can do to help prevent falls. This information is not intended to replace advice given to you by your health care provider. Make sure you discuss any questions you have with your health care provider. Document Released: 07/08/2009 Document Revised: 02/17/2016 Document Reviewed: 10/16/2014 Elsevier Interactive Patient Education  2017 [employer].

## 2019-07-23 ENCOUNTER — Other Ambulatory Visit: Payer: Self-pay | Admitting: Nurse Practitioner

## 2019-08-28 DIAGNOSIS — I10 Essential (primary) hypertension: Secondary | ICD-10-CM | POA: Diagnosis not present

## 2019-08-28 DIAGNOSIS — I119 Hypertensive heart disease without heart failure: Secondary | ICD-10-CM | POA: Diagnosis not present

## 2019-08-28 DIAGNOSIS — E782 Mixed hyperlipidemia: Secondary | ICD-10-CM | POA: Diagnosis not present

## 2019-09-16 ENCOUNTER — Other Ambulatory Visit: Payer: Self-pay | Admitting: Nurse Practitioner

## 2019-10-09 ENCOUNTER — Ambulatory Visit
Admission: RE | Admit: 2019-10-09 | Discharge: 2019-10-09 | Disposition: A | Discharge status: Home / Self Care | Payer: Medicare HMO | Source: Ambulatory Visit | Attending: Nurse Practitioner | Admitting: Nurse Practitioner

## 2019-10-09 DIAGNOSIS — Z780 Asymptomatic menopausal state: Secondary | ICD-10-CM | POA: Diagnosis not present

## 2019-10-09 DIAGNOSIS — Z13820 Encounter for screening for osteoporosis: Secondary | ICD-10-CM | POA: Diagnosis not present

## 2019-10-09 DIAGNOSIS — Z1231 Encounter for screening mammogram for malignant neoplasm of breast: Secondary | ICD-10-CM | POA: Diagnosis not present

## 2019-10-09 NOTE — Progress Notes (Signed)
Please let Justin know her bone density returned normal, no osteoporosis or osteopenia.  This is great news.  Have a great day!!

## 2019-11-02 ENCOUNTER — Other Ambulatory Visit: Payer: Self-pay | Admitting: Nurse Practitioner

## 2019-11-02 NOTE — Telephone Encounter (Signed)
Requested Prescriptions  Pending Prescriptions Disp Refills  . hydrochlorothiazide (HYDRODIURIL) 12.5 MG tablet [Pharmacy Med Name: HYDROCHLOROTHIAZIDE 12.5 MG TB] 90 tablet 3    Sig: TAKE 1 TABLET BY MOUTH EVERY DAY     Cardiovascular: Diuretics - Thiazide Passed - 11/02/2019  9:32 AM      Passed - Ca in normal range and within 360 days    Calcium  Date Value Ref Range Status  06/18/2019 9.4 8.7 - 10.3 mg/dL Final         Passed - Cr in normal range and within 360 days    Creatinine, Ser  Date Value Ref Range Status  06/18/2019 0.75 0.57 - 1.00 mg/dL Final         Passed - K in normal range and within 360 days    Potassium  Date Value Ref Range Status  06/18/2019 4.0 3.5 - 5.2 mmol/L Final         Passed - Na in normal range and within 360 days    Sodium  Date Value Ref Range Status  06/18/2019 142 134 - 144 mmol/L Final         Passed - Last BP in normal range    BP Readings from Last 1 Encounters:  06/16/19 119/80         Passed - Valid encounter within last 6 months    Recent Outpatient Visits          4 months ago Prediabetes   [place], Barbaraann Faster, NP   11 months ago Cervical cancer screening   [place], Barbaraann Faster, NP   1 year ago Essential hypertension   [place], Henrine Screws T, NP   2 years ago Annual physical exam   [facility] Kathrine Haddock, NP   2 years ago Postmenopausal vaginal bleeding   [facility] Kathrine Haddock, NP      Future Appointments            In 1 month [place], Barbaraann Faster, NP [employer]

## 2019-12-15 ENCOUNTER — Ambulatory Visit (INDEPENDENT_AMBULATORY_CARE_PROVIDER_SITE_OTHER): Payer: Medicare HMO | Admitting: Nurse Practitioner

## 2019-12-15 ENCOUNTER — Other Ambulatory Visit: Payer: Self-pay

## 2019-12-15 ENCOUNTER — Encounter: Payer: Self-pay | Admitting: Nurse Practitioner

## 2019-12-15 ENCOUNTER — Other Ambulatory Visit: Payer: Self-pay | Admitting: Nurse Practitioner

## 2019-12-15 DIAGNOSIS — E559 Vitamin D deficiency, unspecified: Secondary | ICD-10-CM

## 2019-12-15 DIAGNOSIS — R7303 Prediabetes: Secondary | ICD-10-CM | POA: Diagnosis not present

## 2019-12-15 DIAGNOSIS — I10 Essential (primary) hypertension: Secondary | ICD-10-CM

## 2019-12-15 DIAGNOSIS — Z789 Other specified health status: Secondary | ICD-10-CM

## 2019-12-15 DIAGNOSIS — E782 Mixed hyperlipidemia: Secondary | ICD-10-CM | POA: Diagnosis not present

## 2019-12-15 LAB — BAYER DCA HB A1C WAIVED: HB A1C (BAYER DCA - WAIVED): 6.1 % (ref <7.0)

## 2019-12-15 NOTE — Assessment & Plan Note (Signed)
Chronic, ongoing.  Continue daily supplement and recheck Vit D level today, adjust dosing as needed.

## 2019-12-15 NOTE — Assessment & Plan Note (Signed)
Chronic, ongoing.  Is tolerating Crestor on 3 day week schedule.  Will continue this and adjust as needed.  Obtain lipid panel and CMP today. 

## 2019-12-15 NOTE — Assessment & Plan Note (Signed)
At this time is tolerating Crestor on 3 day a week schedule, adjust this as needed.   If symptoms present then consider weekly dosing or change to Repatha. 

## 2019-12-15 NOTE — Patient Instructions (Signed)

## 2019-12-15 NOTE — Progress Notes (Signed)
BP 129/78   Pulse 62   Temp 98.4 F (36.9 C) (Oral)   LMP  (LMP Unknown)   SpO2 93%    Subjective:    Patient ID: Kiara Parker, female    DOB: 09-10-1951, 69 y.o.   MRN: [id number]  HPI: Kiara Parker is a 69 y.o. female  Chief Complaint  Patient presents with  . Hyperlipidemia  . Hypertension   HYPERLIPIDEMIA Continues on Crestor 5 MG three days a week.  Followed by cardiology, Dr. Nehemiah Massed, and last seen 08/28/2019, no changes made. She states the Crestor on three day schedule is working better and less side effects.  Continues on HCTZ 12.5 MG  Hyperlipidemia status: good compliance Satisfied with current treatment?  no Side effects:  no Medication compliance: good compliance Past cholesterol meds: Lipitor and Pravastatin Supplements: none Aspirin:  yes The 10-year ASCVD risk score Mikey Bussing DC Jr., et al., 2013) is: 9.7%   Values used to calculate the score:     Age: 4 years     Sex: Female     Is Non-Hispanic African American: No     Diabetic: No     Tobacco smoker: No     Systolic Blood Pressure: [id number] mmHg     Is BP treated: Yes     HDL Cholesterol: 47 mg/dL     Total Cholesterol: 194 mg/dL Chest pain:  no Coronary artery disease:  no Family history CAD:  no Family history early CAD:  no   PREDIABETES: A1C in February was 5.9%.  Diet focused at this time, although she reports not following diet well.  Her mom has pancreatic cancer and they eat whatever she likes.  She has history of low Vitamin D on labs in February 2020 -- 11.3.  She was treated with high dose Vit D, but now taking 1000 units daily.  No recent falls or fractures. Polydipsia/polyuria: no Visual disturbance: no Chest pain: no Paresthesias: no  Relevant past medical, surgical, family and social history reviewed and updated as indicated. Interim medical history since our last visit reviewed. Allergies and medications reviewed and updated.  Review of Systems  Constitutional: Negative for  activity change, appetite change, diaphoresis, fatigue and fever.  Respiratory: Negative for cough, chest tightness and shortness of breath.   Cardiovascular: Negative for chest pain, palpitations and leg swelling.  Gastrointestinal: Negative for abdominal distention, abdominal pain, constipation, diarrhea, nausea and vomiting.  Endocrine: Negative for cold intolerance, heat intolerance, polydipsia, polyphagia and polyuria.  Neurological: Negative for dizziness, syncope, weakness, light-headedness, numbness and headaches.  Psychiatric/Behavioral: Negative.     Per HPI unless specifically indicated above     Objective:    BP 129/78   Pulse 62   Temp 98.4 F (36.9 C) (Oral)   LMP  (LMP Unknown)   SpO2 93%   Wt Readings from Last 3 Encounters:  07/21/19 220 lb (99.8 kg)  12/06/18 219 lb (99.3 kg)  11/27/18 225 lb (102.1 kg)    Physical Exam Vitals and nursing note reviewed.  Constitutional:      General: She is awake. She is not in acute distress.    Appearance: She is well-developed. She is obese. She is not ill-appearing.  HENT:     Head: Normocephalic.     Right Ear: Hearing normal.     Left Ear: Hearing normal.  Eyes:     General: Lids are normal.        Right eye: No discharge.  Left eye: No discharge.     Conjunctiva/sclera: Conjunctivae normal.     Pupils: Pupils are equal, round, and reactive to light.  Neck:     Vascular: No carotid bruit.  Cardiovascular:     Rate and Rhythm: Normal rate and regular rhythm.     Heart sounds: Normal heart sounds. No murmur. No gallop.   Pulmonary:     Effort: Pulmonary effort is normal. No accessory muscle usage or respiratory distress.     Breath sounds: Normal breath sounds.  Abdominal:     General: Bowel sounds are normal.     Palpations: Abdomen is soft.  Musculoskeletal:     Cervical back: Normal range of motion and neck supple.     Right lower leg: No edema.     Left lower leg: No edema.  Skin:    General:  Skin is warm and dry.  Neurological:     Mental Status: She is alert and oriented to person, place, and time.  Psychiatric:        Attention and Perception: Attention normal.        Mood and Affect: Mood normal.        Behavior: Behavior normal. Behavior is cooperative.        Thought Content: Thought content normal.        Judgment: Judgment normal.     Results for orders placed or performed in visit on 06/18/19  Lipid Panel w/o Chol/HDL Ratio  Result Value Ref Range   Cholesterol, Total 194 100 - 199 mg/dL   Triglycerides 154 (H) 0 - 149 mg/dL   HDL 47 >39 mg/dL   VLDL Cholesterol Cal 27 5 - 40 mg/dL   LDL Chol Calc (NIH) 120 (H) 0 - 99 mg/dL  HgB A1c  Result Value Ref Range   Hgb A1c MFr Bld 5.9 (H) 4.8 - 5.6 %   Est. average glucose Bld gHb Est-mCnc 123 mg/dL  Comprehensive metabolic panel  Result Value Ref Range   Glucose 101 (H) 65 - 99 mg/dL   BUN 15 8 - 27 mg/dL   Creatinine, Ser 0.75 0.57 - 1.00 mg/dL   GFR calc non Af Amer 83 >59 mL/min/1.73   GFR calc Af Amer 96 >59 mL/min/1.73   BUN/Creatinine Ratio 20 12 - 28   Sodium 142 134 - 144 mmol/L   Potassium 4.0 3.5 - 5.2 mmol/L   Chloride 100 96 - 106 mmol/L   CO2 28 20 - 29 mmol/L   Calcium 9.4 8.7 - 10.3 mg/dL   Total Protein 6.6 6.0 - 8.5 g/dL   Albumin 4.1 3.8 - 4.8 g/dL   Globulin, Total 2.5 1.5 - 4.5 g/dL   Albumin/Globulin Ratio 1.6 1.2 - 2.2   Bilirubin Total 0.4 0.0 - 1.2 mg/dL   Alkaline Phosphatase 77 39 - 117 IU/L   AST 23 0 - 40 IU/L   ALT 30 0 - 32 IU/L      Assessment & Plan:   Problem List Items Addressed This Visit      Cardiovascular and Mediastinum   Hypertension    Chronic, stable with BP below goal. Continue low dose HCTZ.  Recommend she monitor BP at least a few days a week at home and document.  Obtain CMP today.  Continue collaboration with cardiology.  Return in 6 months.        Other   Hyperlipidemia    Chronic, ongoing.  Is tolerating Crestor on 3 day week schedule.  Will  continue this and adjust as needed.  Obtain lipid panel and CMP today.      Relevant Orders   Lipid Panel w/o Chol/HDL Ratio   Comprehensive metabolic panel   Morbid obesity ([place]) - Primary    Recommend healthy diet choices and regular exercise regimen (30 minutes 5 days a week).  Focus on small goals at at time and set timelines to achieve.      Vitamin D deficiency    Chronic, ongoing.  Continue daily supplement and recheck Vit D level today, adjust dosing as needed.      Relevant Orders   VITAMIN D 25 Hydroxy (Vit-D Deficiency, Fractures)   Prediabetes    Ongoing with A1C today 6.1%, she continues to be between 5.6 to 6.1% on review.  Will adjust plan of care and add medication as needed.  Continue diet focus at this time.      Relevant Orders   Bayer DCA Hb A1c Waived   Statin intolerance    At this time is tolerating Crestor on 3 day a week schedule, adjust this as needed.   If symptoms present then consider weekly dosing or change to Repatha.          Follow up plan: Return in about 6 months (around 06/16/2020) for HLD and Prediabetes + HTN.

## 2019-12-15 NOTE — Assessment & Plan Note (Addendum)
Chronic, stable with BP below goal. Continue low dose HCTZ.  Recommend she monitor BP at least a few days a week at home and document.  Obtain CMP today.  Continue collaboration with cardiology.  Return in 6 months.

## 2019-12-15 NOTE — Telephone Encounter (Signed)
Requested Prescriptions  Pending Prescriptions Disp Refills  . meloxicam (MOBIC) 15 MG tablet [Pharmacy Med Name: MELOXICAM 15 MG TABLET] 90 tablet 0    Sig: TAKE 1 TABLET BY MOUTH EVERY DAY     Analgesics:  COX2 Inhibitors Failed - 12/15/2019  1:15 AM      Failed - HGB in normal range and within 360 days    Hemoglobin  Date Value Ref Range Status  10/31/2018 13.6 11.1 - 15.9 g/dL Final         Passed - Cr in normal range and within 360 days    Creatinine, Ser  Date Value Ref Range Status  06/18/2019 0.75 0.57 - 1.00 mg/dL Final         Passed - Patient is not pregnant      Passed - Valid encounter within last 12 months    Recent Outpatient Visits          6 months ago Prediabetes   [place], Henrine Screws T, NP   1 year ago Cervical cancer screening   [place], Barbaraann Faster, NP   1 year ago Essential hypertension   [place], Henrine Screws T, NP   2 years ago Annual physical exam   [facility] Kathrine Haddock, NP   2 years ago Postmenopausal vaginal bleeding   [facility] Kathrine Haddock, NP      Future Appointments            Today Venita Lick, NP [facility]

## 2019-12-15 NOTE — Assessment & Plan Note (Signed)
Recommend healthy diet choices and regular exercise regimen (30 minutes 5 days a week).  Focus on small goals at at time and set timelines to achieve.

## 2019-12-15 NOTE — Assessment & Plan Note (Signed)
Ongoing with A1C today 6.1%, she continues to be between 5.6 to 6.1% on review.  Will adjust plan of care and add medication as needed.  Continue diet focus at this time.

## 2019-12-16 LAB — LIPID PANEL W/O CHOL/HDL RATIO
Cholesterol, Total: 222 mg/dL — ABNORMAL HIGH (ref 100–199)
HDL: 55 mg/dL (ref >39)
LDL Chol Calc (NIH): 145 mg/dL — ABNORMAL HIGH (ref 0–99)
Triglycerides: 122 mg/dL (ref 0–149)
VLDL Cholesterol Cal: 22 mg/dL (ref 5–40)

## 2019-12-16 LAB — VITAMIN D 25 HYDROXY (VIT D DEFICIENCY, FRACTURES): Vit D, 25-Hydroxy: 36.4 ng/mL (ref 30.0–100.0)

## 2019-12-16 LAB — COMPREHENSIVE METABOLIC PANEL
ALT: 28 IU/L (ref 0–32)
AST: 24 IU/L (ref 0–40)
Albumin/Globulin Ratio: 1.5 (ref 1.2–2.2)
Albumin: 4.1 g/dL (ref 3.8–4.8)
Alkaline Phosphatase: 78 IU/L (ref 39–117)
BUN/Creatinine Ratio: 16 (ref 12–28)
BUN: 14 mg/dL (ref 8–27)
Bilirubin Total: 0.4 mg/dL (ref 0.0–1.2)
CO2: 25 mmol/L (ref 20–29)
Calcium: 9.5 mg/dL (ref 8.7–10.3)
Chloride: 101 mmol/L (ref 96–106)
Creatinine, Ser: 0.87 mg/dL (ref 0.57–1.00)
GFR calc Af Amer: 80 mL/min/{1.73_m2} (ref >59)
GFR calc non Af Amer: 69 mL/min/{1.73_m2} (ref >59)
Globulin, Total: 2.8 g/dL (ref 1.5–4.5)
Glucose: 102 mg/dL — ABNORMAL HIGH (ref 65–99)
Potassium: 4.5 mmol/L (ref 3.5–5.2)
Sodium: 140 mmol/L (ref 134–144)
Total Protein: 6.9 g/dL (ref 6.0–8.5)

## 2019-12-16 NOTE — Progress Notes (Signed)
Good morning.  Please let Ms, Glovier know her labs have returned.  Vitamin D level is normal, continue daily supplement.  Cholesterol levels are elevated this check, please ensure you are taking your Rosuvastatin 3 times a week and focus on diet changes, will recheck next visit and may adjust medication dose if needed.  Kidney and liver function remains normal.  Have a great day and let me know if any questions.

## 2020-02-13 ENCOUNTER — Other Ambulatory Visit: Payer: Self-pay | Admitting: Nurse Practitioner

## 2020-02-13 NOTE — Telephone Encounter (Signed)
Requested medication (s) are due for refill today -yes  Requested medication (s) are on the active medication list -yes  Future visit scheduled -yes  Last refill: 09/22/19  Notes to clinic: Request for non delegated Rx  Requested Prescriptions  Pending Prescriptions Disp Refills   baclofen (LIORESAL) 10 MG tablet [Pharmacy Med Name: BACLOFEN 10 MG TABLET] 45 tablet 2    Sig: TAKE 0.5 TABLETS (5 MG TOTAL) BY MOUTH AT BEDTIME.      Not Delegated - Analgesics:  Muscle Relaxants Failed - 02/13/2020  4:23 PM      Failed - This refill cannot be delegated      Passed - Valid encounter within last 6 months    Recent Outpatient Visits           2 months ago Morbid obesity ([place])   [place], Barbaraann Faster, NP   8 months ago Prediabetes   [place], [place] T, NP   1 year ago Cervical cancer screening   [place], [place] T, NP   1 year ago Essential hypertension   [place], [place] T, NP   2 years ago Annual physical exam   [facility] Kathrine Haddock, NP       Future Appointments             In 4 months [place], Barbaraann Faster, NP [facility], [facility]             Signed Prescriptions Disp Refills   meloxicam (MOBIC) 15 MG tablet 90 tablet 0    Sig: TAKE 1 TABLET BY MOUTH EVERY DAY      Analgesics:  COX2 Inhibitors Failed - 02/13/2020  4:23 PM      Failed - HGB in normal range and within 360 days    Hemoglobin  Date Value Ref Range Status  10/31/2018 13.6 11.1 - 15.9 g/dL Final          Passed - Cr in normal range and within 360 days    Creatinine, Ser  Date Value Ref Range Status  12/15/2019 0.87 0.57 - 1.00 mg/dL Final          Passed - Patient is not pregnant      Passed - Valid encounter within last 12 months    Recent Outpatient Visits           2 months ago Morbid obesity ([place])   [place], Barbaraann Faster, NP   8 months ago  Prediabetes   [place], [place] T, NP   1 year ago Cervical cancer screening   [place], Barbaraann Faster, NP   1 year ago Essential hypertension   [place], Henrine Screws T, NP   2 years ago Annual physical exam   [place] Kathrine Haddock, NP       Future Appointments             In 4 months Cannady, Barbaraann Faster, NP [facility]                Requested Prescriptions  Pending Prescriptions Disp Refills   baclofen (LIORESAL) 10 MG tablet [Pharmacy Med Name: BACLOFEN 10 MG TABLET] 45 tablet 2    Sig: TAKE 0.5 TABLETS (5 MG TOTAL) BY MOUTH AT BEDTIME.      Not Delegated - Analgesics:  Muscle Relaxants Failed - 02/13/2020  4:23 PM  Failed - This refill cannot be delegated      Passed - Valid encounter within last 6 months    Recent Outpatient Visits           2 months ago Morbid obesity ([place])   [place], Barbaraann Faster, NP   8 months ago Prediabetes   [place], [place] T, NP   1 year ago Cervical cancer screening   [place], Henrine Screws T, NP   1 year ago Essential hypertension   [place], [place] T, NP   2 years ago Annual physical exam   [facility] Kathrine Haddock, NP       Future Appointments             In 4 months Cannady, Barbaraann Faster, NP [facility]             Signed Prescriptions Disp Refills   meloxicam (MOBIC) 15 MG tablet 90 tablet 0    Sig: TAKE 1 TABLET BY MOUTH EVERY DAY      Analgesics:  COX2 Inhibitors Failed - 02/13/2020  4:23 PM      Failed - HGB in normal range and within 360 days    Hemoglobin  Date Value Ref Range Status  10/31/2018 13.6 11.1 - 15.9 g/dL Final          Passed - Cr in normal range and within 360 days    Creatinine, Ser  Date Value Ref Range Status  12/15/2019 0.87 0.57 - 1.00 mg/dL Final          Passed -  Patient is not pregnant      Passed - Valid encounter within last 12 months    Recent Outpatient Visits           2 months ago Morbid obesity ([place])   [place], Barbaraann Faster, NP   8 months ago Prediabetes   [place], [place] T, NP   1 year ago Cervical cancer screening   [place], Barbaraann Faster, NP   1 year ago Essential hypertension   [place], Henrine Screws T, NP   2 years ago Annual physical exam   [facility] Kathrine Haddock, NP       Future Appointments             In 4 months [place], Barbaraann Faster, NP [facility]

## 2020-03-15 ENCOUNTER — Other Ambulatory Visit: Payer: Self-pay | Admitting: Nurse Practitioner

## 2020-03-15 NOTE — Telephone Encounter (Signed)
Requested medication (s) are due for refill today: yes  Requested medication (s) are on the active medication list: yes  Last refill:  12/20/2019  Future visit scheduled: yes  Notes to clinic:  this refill cannot be delegated    Requested Prescriptions  Pending Prescriptions Disp Refills   baclofen (LIORESAL) 10 MG tablet [Pharmacy Med Name: BACLOFEN 10 MG TABLET] 45 tablet 2    Sig: TAKE 0.5 TABLETS (5 MG TOTAL) BY MOUTH AT BEDTIME.      Not Delegated - Analgesics:  Muscle Relaxants Failed - 03/15/2020  1:18 AM      Failed - This refill cannot be delegated      Passed - Valid encounter within last 6 months    Recent Outpatient Visits           3 months ago Morbid obesity ([place])   [place], Barbaraann Faster, NP   9 months ago Prediabetes   [place], [place] T, NP   1 year ago Cervical cancer screening   [place], Barbaraann Faster, NP   1 year ago Essential hypertension   [place], Henrine Screws T, NP   3 years ago Annual physical exam   [facility] Kathrine Haddock, NP       Future Appointments             In 3 months Cannady, Barbaraann Faster, NP [facility]

## 2020-03-15 NOTE — Telephone Encounter (Signed)
Routing to provider  

## 2020-05-23 ENCOUNTER — Other Ambulatory Visit: Payer: Self-pay | Admitting: Nurse Practitioner

## 2020-06-16 ENCOUNTER — Ambulatory Visit: Payer: Medicare HMO | Admitting: Nurse Practitioner

## 2020-06-24 ENCOUNTER — Ambulatory Visit: Payer: Medicare HMO | Admitting: Nurse Practitioner

## 2020-06-28 ENCOUNTER — Ambulatory Visit: Payer: Self-pay

## 2020-06-29 ENCOUNTER — Encounter: Payer: Self-pay | Admitting: Nurse Practitioner

## 2020-06-29 ENCOUNTER — Ambulatory Visit (INDEPENDENT_AMBULATORY_CARE_PROVIDER_SITE_OTHER): Payer: Medicare HMO | Admitting: Nurse Practitioner

## 2020-06-29 DIAGNOSIS — E559 Vitamin D deficiency, unspecified: Secondary | ICD-10-CM

## 2020-06-29 DIAGNOSIS — R7303 Prediabetes: Secondary | ICD-10-CM | POA: Diagnosis not present

## 2020-06-29 DIAGNOSIS — I10 Essential (primary) hypertension: Secondary | ICD-10-CM | POA: Diagnosis not present

## 2020-06-29 DIAGNOSIS — J069 Acute upper respiratory infection, unspecified: Secondary | ICD-10-CM

## 2020-06-29 DIAGNOSIS — Z789 Other specified health status: Secondary | ICD-10-CM | POA: Diagnosis not present

## 2020-06-29 DIAGNOSIS — E782 Mixed hyperlipidemia: Secondary | ICD-10-CM

## 2020-06-29 MED ORDER — ROSUVASTATIN CALCIUM 5 MG PO TABS: ORAL_TABLET | ORAL | 3 refills | Status: DC

## 2020-06-29 MED ORDER — GABAPENTIN 300 MG PO CAPS
ORAL_CAPSULE | ORAL | 2 refills | Status: DC
Start: 2020-06-29 — End: 2020-12-28

## 2020-06-29 MED ORDER — HYDROCHLOROTHIAZIDE 12.5 MG PO TABS: 12.5000 mg | ORAL_TABLET | Freq: Every day | ORAL | 3 refills | Status: DC

## 2020-06-29 NOTE — Assessment & Plan Note (Signed)
At this time is tolerating Crestor on 3 day a week schedule, adjust this as needed.   If symptoms present then consider weekly dosing or change to Repatha.

## 2020-06-29 NOTE — Assessment & Plan Note (Signed)
Chronic, ongoing.  Is tolerating Crestor on 3 day week schedule.  Will continue this and adjust as needed.  Obtain lipid panel outpatient.

## 2020-06-29 NOTE — Progress Notes (Addendum)
MyChart Video Visit    Virtual Visit via Video Note   This visit type was conducted due to national recommendations for restrictions regarding the COVID-19 Pandemic (e.g. social distancing) in an effort to limit this patient's exposure and mitigate transmission in our community. This patient is at least at moderate risk for complications without adequate follow up. This format is felt to be most appropriate for this patient at this time. Physical exam was note performed due to telephone visit only.  Patient location: home Provider location: work  I discussed the limitations of evaluation and management by telemedicine and the availability of in person appointments. The patient expressed understanding and agreed to proceed.  Patient: Kiara Parker   DOB: [birth year]   69 y.o. Female  MRN: [id number] Visit Date: 06/29/2020  Today's healthcare provider: Venita Lick, NP   No chief complaint on file.  Subjective    HPI  Hypertension, follow-up  BP Readings from Last 3 Encounters:  12/15/19 129/78  06/16/19 119/80  12/06/18 114/72   Wt Readings from Last 3 Encounters:  07/21/19 220 lb (99.8 kg)  12/06/18 219 lb (99.3 kg)  11/27/18 225 lb (102.1 kg)     HYPERLIPIDEMIA Continues on Crestor 5 MG three days a week.  Followed by cardiology, Dr. Nehemiah Massed, and last seen 08/28/2019, no changes made. She states the Crestor on three day schedule is working better and less side effects.  Continues on HCTZ 12.5 MG . Hyperlipidemia status: good compliance Satisfied with current treatment?  no Side effects:  no Medication compliance: good compliance Past cholesterol meds: Lipitor and Pravastatin Supplements: none Aspirin:  yes The 10-year ASCVD risk score Mikey Bussing DC Jr., et al., 2013) is: 9.8%   Values used to calculate the score:     Age: 14 years     Sex: Female     Is Non-Hispanic African American: No     Diabetic: No     Tobacco smoker: No     Systolic Blood Pressure: 160  mmHg     Is BP treated: Yes     HDL Cholesterol: 55 mg/dL     Total Cholesterol: 222 mg/dL Chest pain:  no Coronary artery disease:  no Family history CAD:  no Family history early CAD:  no   UPPER RESPIRATORY TRACT INFECTION Has been in bed the past few days, started Monday or Tuesday of last week.  Her sister was sick first.  She thinks she is at the end of it and getting better.  Has had Covid vaccines, last in March.  No loss of taste or smell.   Fever: no Cough: yes, improving Shortness of breath: no Wheezing: no Chest pain: no Chest tightness: no Chest congestion: no Nasal congestion: yes Runny nose: yes Post nasal drip: yes Sneezing: no Sore throat: yes Swollen glands: no Sinus pressure: yes Headache: yes Face pain: yes Toothache: no Ear pain: none Ear pressure: none Eyes red/itching:no Eye drainage/crusting: no  Vomiting: no Rash: no Fatigue: yes Sick contacts: yes Strep contacts: no  Context: stable Recurrent sinusitis: no Relief with OTC cold/cough medications: Dayquil and Nyquil Treatments attempted: Dayquil, Nyquil  PREDIABETES: A1C in March 6.1%.  Diet focused at this time, although she reports not following diet well.  Her mom has pancreatic cancer and they eat whatever she likes.  She has history of low Vitamin D on labs in March 2021 was 36.4.  She was treated with high dose Vit D, but now taking 1000 units daily.  No recent falls or fractures. Polydipsia/polyuria: no Visual disturbance: no Chest pain: no Paresthesias: no  She reports good compliance with treatment. She is not having side effects.  She is following a Regular diet. She is not exercising. She does not smoke.  Use of agents associated with hypertension: none.  Symptoms: No chest pain No chest pressure  No palpitations No syncope  No dyspnea No orthopnea  No paroxysmal nocturnal dyspnea No lower extremity edema   Pertinent labs: Lab Results  Component Value Date   CHOL 222  (H) 12/15/2019   HDL 55 12/15/2019   LDLCALC 145 (H) 12/15/2019   TRIG 122 12/15/2019   Lab Results  Component Value Date   NA 140 12/15/2019   K 4.5 12/15/2019   CREATININE 0.87 12/15/2019   GFRNONAA 69 12/15/2019   GFRAA 80 12/15/2019   GLUCOSE 102 (H) 12/15/2019     The 10-year ASCVD risk score Mikey Bussing DC Jr., et al., 2013) is: 9.8%   ---------------------------------------------------------------------------------------------------   Patient Active Problem List   Diagnosis Date Noted  . Upper respiratory tract infection 06/29/2020  . Statin intolerance 12/06/2018  . Prediabetes 12/05/2018  . Vitamin D deficiency 11/01/2018  . Insomnia 01/16/2017  . Morbid obesity ([place]) 12/13/2016  . Hyperlipidemia   . Hypertension   . Chronic pain syndrome   . Lumbago   . Spinal stenosis    Social History   Tobacco Use  . Smoking status: Never Smoker  . Smokeless tobacco: Never Used  Vaping Use  . Vaping Use: Never used  Substance Use Topics  . Alcohol use: No  . Drug use: No   Allergies  Allergen Reactions  . Atorvastatin Other (See Comments)  . Erythromycin   . Penicillins   . Pravastatin Other (See Comments)  . Zithromax [Azithromycin]       Medications: Outpatient Medications Prior to Visit  Medication Sig  . ASPIRIN 81 PO Take 1 tablet by mouth daily.  . baclofen (LIORESAL) 10 MG tablet TAKE 0.5 TABLETS (5 MG TOTAL) BY MOUTH AT BEDTIME.  . meloxicam (MOBIC) 15 MG tablet TAKE 1 TABLET BY MOUTH EVERY DAY  . [DISCONTINUED] gabapentin (NEURONTIN) 300 MG capsule TAKE 1 CAPSULE BY MOUTH THREE TIMES A DAY  . [DISCONTINUED] hydrochlorothiazide (HYDRODIURIL) 12.5 MG tablet TAKE 1 TABLET BY MOUTH EVERY DAY  . [DISCONTINUED] rosuvastatin (CRESTOR) 5 MG tablet TAKE 1 TABLET (5 MG TOTAL) BY MOUTH 3 (THREE) TIMES A WEEK   No facility-administered medications prior to visit.    Review of Systems  Constitutional: Positive for fatigue. Negative for activity change, appetite  change, diaphoresis and fever.  HENT: Positive for congestion, postnasal drip, rhinorrhea, sinus pressure and sore throat. Negative for ear discharge and ear pain.   Respiratory: Negative for cough, chest tightness, shortness of breath and wheezing.   Cardiovascular: Negative for chest pain, palpitations and leg swelling.  Gastrointestinal: Negative.   Endocrine: Negative for polydipsia, polyphagia and polyuria.  Neurological: Positive for headaches. Negative for dizziness, syncope, weakness, light-headedness and numbness.  Psychiatric/Behavioral: Negative.     Last CBC Lab Results  Component Value Date   WBC 5.4 10/31/2018   HGB 13.6 10/31/2018   HCT 40.8 10/31/2018   MCV 89 10/31/2018   MCH 29.7 10/31/2018   RDW 12.9 10/31/2018   PLT 293 24/40/1027   Last metabolic panel Lab Results  Component Value Date   GLUCOSE 102 (H) 12/15/2019   NA 140 12/15/2019   K 4.5 12/15/2019   CL 101 12/15/2019  CO2 25 12/15/2019   BUN 14 12/15/2019   CREATININE 0.87 12/15/2019   GFRNONAA 69 12/15/2019   GFRAA 80 12/15/2019   CALCIUM 9.5 12/15/2019   PROT 6.9 12/15/2019   ALBUMIN 4.1 12/15/2019   LABGLOB 2.8 12/15/2019   AGRATIO 1.5 12/15/2019   BILITOT 0.4 12/15/2019   ALKPHOS 78 12/15/2019   AST 24 12/15/2019   ALT 28 12/15/2019   Last lipids Lab Results  Component Value Date   CHOL 222 (H) 12/15/2019   HDL 55 12/15/2019   LDLCALC 145 (H) 12/15/2019   TRIG 122 12/15/2019   Last hemoglobin A1c Lab Results  Component Value Date   HGBA1C 6.1 12/15/2019   Last thyroid functions Lab Results  Component Value Date   TSH 1.410 10/31/2018   Last vitamin D Lab Results  Component Value Date   VD25OH 36.4 12/15/2019      Objective    LMP  (LMP Unknown)  BP Readings from Last 3 Encounters:  12/15/19 129/78  06/16/19 119/80  12/06/18 114/72   Wt Readings from Last 3 Encounters:  07/21/19 220 lb (99.8 kg)  12/06/18 219 lb (99.3 kg)  11/27/18 225 lb (102.1 kg)       Physical Exam   Unable to perform due to telephone visit only.   Assessment & Plan     Hypertension Chronic, stable with BP below goal at recent visits. Continue low dose HCTZ.  Recommend she monitor BP at least a few days a week at home and document.  Obtain BMP outpatient.  Continue collaboration with cardiology.  Return in 6 months.  Hyperlipidemia Chronic, ongoing.  Is tolerating Crestor on 3 day week schedule.  Will continue this and adjust as needed.  Obtain lipid panel outpatient.  Morbid obesity ([place]) Recommended eating smaller high protein, low fat meals more frequently and exercising 30 mins a day 5 times a week with a goal of 10-15lb weight loss in the next 3 months. Patient voiced their understanding and motivation to adhere to these recommendations.   Prediabetes Ongoing with A1C last 6.1%, she continues to be between 5.6 to 6.1% on review.  Will adjust plan of care and add medication as needed.  Continue diet focus at this time.  A1C outpatient labs.  Statin intolerance At this time is tolerating Crestor on 3 day a week schedule, adjust this as needed.   If symptoms present then consider weekly dosing or change to Repatha.  Vitamin D deficiency Chronic, ongoing.  Continue daily supplement and recheck Vit D level outpatient, adjust dosing as needed.  Upper respiratory tract infection Recommend Covid testing due to current pandemic and self quarantine until symptoms improved.  Feeling better, will monitor and return to office for worsening.   Return in about 6 months (around 12/28/2020) for Annual physical.     I discussed the assessment and treatment plan with the patient. The patient was provided an opportunity to ask questions and all were answered. The patient agreed with the plan and demonstrated an understanding of the instructions.   The patient was advised to call back or seek an in-person evaluation if the symptoms worsen or if the condition fails to improve  as anticipated.  I provided 25 minutes of non-face-to-face time during this encounter.    Venita Lick, NP [facility] [phone] (phone) [phone] (fax)  [place]

## 2020-06-29 NOTE — Assessment & Plan Note (Signed)
Chronic, ongoing.  Continue daily supplement and recheck Vit D level outpatient, adjust dosing as needed.

## 2020-06-29 NOTE — Assessment & Plan Note (Signed)
Recommend Covid testing due to current pandemic and self quarantine until symptoms improved.  Feeling better, will monitor and return to office for worsening.

## 2020-06-29 NOTE — Assessment & Plan Note (Signed)
Ongoing with A1C last 6.1%, she continues to be between 5.6 to 6.1% on review.  Will adjust plan of care and add medication as needed.  Continue diet focus at this time.  A1C outpatient labs.

## 2020-06-29 NOTE — Assessment & Plan Note (Signed)
Chronic, stable with BP below goal at recent visits. Continue low dose HCTZ.  Recommend she monitor BP at least a few days a week at home and document.  Obtain BMP outpatient.  Continue collaboration with cardiology.  Return in 6 months.

## 2020-06-29 NOTE — Patient Instructions (Signed)

## 2020-06-29 NOTE — Assessment & Plan Note (Signed)
Recommended eating smaller high protein, low fat meals more frequently and exercising 30 mins a day 5 times a week with a goal of 10-15lb weight loss in the next 3 months. Patient voiced their understanding and motivation to adhere to these recommendations.  

## 2020-07-06 ENCOUNTER — Other Ambulatory Visit: Payer: Self-pay

## 2020-07-06 ENCOUNTER — Other Ambulatory Visit: Payer: Medicare HMO

## 2020-07-06 DIAGNOSIS — E782 Mixed hyperlipidemia: Secondary | ICD-10-CM

## 2020-07-06 DIAGNOSIS — I10 Essential (primary) hypertension: Secondary | ICD-10-CM | POA: Diagnosis not present

## 2020-07-06 DIAGNOSIS — R7303 Prediabetes: Secondary | ICD-10-CM

## 2020-07-06 DIAGNOSIS — E559 Vitamin D deficiency, unspecified: Secondary | ICD-10-CM

## 2020-07-06 LAB — BAYER DCA HB A1C WAIVED: HB A1C (BAYER DCA - WAIVED): 6 % (ref <7.0)

## 2020-07-07 ENCOUNTER — Telehealth: Payer: Self-pay

## 2020-07-07 LAB — VITAMIN D 25 HYDROXY (VIT D DEFICIENCY, FRACTURES): Vit D, 25-Hydroxy: 35.2 ng/mL (ref 30.0–100.0)

## 2020-07-07 LAB — LIPID PANEL W/O CHOL/HDL RATIO
Cholesterol, Total: 175 mg/dL (ref 100–199)
HDL: 43 mg/dL (ref >39)
LDL Chol Calc (NIH): 105 mg/dL — ABNORMAL HIGH (ref 0–99)
Triglycerides: 153 mg/dL — ABNORMAL HIGH (ref 0–149)
VLDL Cholesterol Cal: 27 mg/dL (ref 5–40)

## 2020-07-07 LAB — BASIC METABOLIC PANEL
BUN/Creatinine Ratio: 19 (ref 12–28)
BUN: 15 mg/dL (ref 8–27)
CO2: 26 mmol/L (ref 20–29)
Calcium: 9.4 mg/dL (ref 8.7–10.3)
Chloride: 102 mmol/L (ref 96–106)
Creatinine, Ser: 0.79 mg/dL (ref 0.57–1.00)
GFR calc Af Amer: 90 mL/min/{1.73_m2} (ref >59)
GFR calc non Af Amer: 78 mL/min/{1.73_m2} (ref >59)
Glucose: 114 mg/dL — ABNORMAL HIGH (ref 65–99)
Potassium: 4.2 mmol/L (ref 3.5–5.2)
Sodium: 141 mmol/L (ref 134–144)

## 2020-07-07 NOTE — Telephone Encounter (Signed)
Tried calling patient back, no answer. LVM to please call back. See result note. Routed to nurse pool at the [facility] to disclose message if patient calls back.

## 2020-07-07 NOTE — Progress Notes (Signed)
Good morning, please let Kiara Parker know her labs have returned and overall cholesterol levels are improving.  Continue current statin regimen.  Vitamin D level is normal, continue daily supplement.  Kidney function is normal.  A1C remains in prediabetic range, continue focus on diet and exercise to avoid this creeping up in diabetic range.  Have a good day!! Keep being awesome!!  Thank you for allowing me to participate in your care. Kindest regards, Kei Mcelhiney

## 2020-07-07 NOTE — Telephone Encounter (Signed)
Copied from [place] [id number]. Topic: General - Other >> Jul 07, 2020  8:45 AM Hinda Lenis D wrote: PT returning call / about lab work / please advise

## 2020-08-06 ENCOUNTER — Ambulatory Visit (INDEPENDENT_AMBULATORY_CARE_PROVIDER_SITE_OTHER): Payer: Medicare HMO

## 2020-08-06 VITALS — Ht 64.0 in | Wt 220.0 lb

## 2020-08-06 DIAGNOSIS — Z0000 Encounter for general adult medical examination without abnormal findings: Secondary | ICD-10-CM

## 2020-08-06 NOTE — Progress Notes (Signed)
I connected with Kiara Parker today by telephone and verified that I am speaking with the correct person using two identifiers. Location patient: home Location provider: work Persons participating in the virtual visit: Edie, [place] LPN.   I discussed the limitations, risks, security and privacy concerns of performing an evaluation and management service by telephone and the availability of in person appointments. I also discussed with the patient that there may be a patient responsible charge related to this service. The patient expressed understanding and verbally consented to this telephonic visit.    Interactive audio and video telecommunications were attempted between this provider and patient, however failed, due to patient having technical difficulties OR patient did not have access to video capability.  We continued and completed visit with audio only.     Vital signs may be patient reported or missing.  Subjective:   Kiara Parker is a 69 y.o. female who presents for Medicare Annual (Subsequent) preventive examination.  Review of Systems     Cardiac Risk Factors include: advanced age (>80men, >69 women);hypertension;obesity (BMI >30kg/m2);sedentary lifestyle     Objective:    Today's Vitals   08/06/20 1431  Weight: 220 lb (99.8 kg)  Height: 5\' 4"  (1.626 m)   Body mass index is 37.76 kg/m.  Advanced Directives 08/06/2020 11/27/2018 12/04/2017 11/13/2016  Does Patient Have a Medical Advance Directive? No No No No  Would patient like information on creating a medical advance directive? - No - Patient declined - -    Current Medications (verified) Outpatient Encounter Medications as of 08/06/2020  Medication Sig  . ASPIRIN 81 PO Take 1 tablet by mouth daily.  . baclofen (LIORESAL) 10 MG tablet TAKE 0.5 TABLETS (5 MG TOTAL) BY MOUTH AT BEDTIME.  Marland Kitchen gabapentin (NEURONTIN) 300 MG capsule TAKE 1 CAPSULE BY MOUTH THREE TIMES A DAY  . hydrochlorothiazide  (HYDRODIURIL) 12.5 MG tablet Take 1 tablet (12.5 mg total) by mouth daily.  . meloxicam (MOBIC) 15 MG tablet TAKE 1 TABLET BY MOUTH EVERY DAY  . rosuvastatin (CRESTOR) 5 MG tablet TAKE 1 TABLET (5 MG TOTAL) BY MOUTH 3 (THREE) TIMES A WEEK   No facility-administered encounter medications on file as of 08/06/2020.    Allergies (verified) Atorvastatin, Erythromycin, Penicillins, Pravastatin, and Zithromax [azithromycin]   History: Past Medical History:  Diagnosis Date  . Chronic pain syndrome   . Hyperlipidemia   . Hypertension   . Lumbago   . Spinal stenosis    Past Surgical History:  Procedure Laterality Date  . COLONOSCOPY WITH PROPOFOL N/A 11/27/2018   Procedure: COLONOSCOPY WITH PROPOFOL;  Surgeon: Jonathon Bellows, MD;  Location: [facility] ENDOSCOPY;  Service: Gastroenterology;  Laterality: N/A;  . TUBAL LIGATION     Family History  Problem Relation Age of Onset  . Cancer Mother        breast  . Arthritis Mother   . Osteoporosis Mother   . Breast cancer Mother   . Heart disease Father   . Lung disease Father   . Cancer Sister        lymphoma  . Cancer Maternal Grandmother        lymphoma  . Multiple sclerosis Sister    Social History   Socioeconomic History  . Marital status: Married    Spouse name: Not on file  . Number of children: Not on file  . Years of education: Not on file  . Highest education level: Not on file  Occupational History  . Occupation: retired  Tobacco Use  . Smoking status: Never Smoker  . Smokeless tobacco: Never Used  Vaping Use  . Vaping Use: Never used  Substance and Sexual Activity  . Alcohol use: No  . Drug use: No  . Sexual activity: Yes    Birth control/protection: None  Other Topics Concern  . Not on file  Social History Narrative  . Not on file   Social Determinants of Health   Financial Resource Strain: Low Risk   . Difficulty of Paying Living Expenses: Not hard at all  Food Insecurity: No Food Insecurity  . Worried About  Charity fundraiser in the Last Year: Never true  . Ran Out of Food in the Last Year: Never true  Transportation Needs: No Transportation Needs  . Lack of Transportation (Medical): No  . Lack of Transportation (Non-Medical): No  Physical Activity: Inactive  . Days of Exercise per Week: 0 days  . Minutes of Exercise per Session: 0 min  Stress: No Stress Concern Present  . Feeling of Stress : Not at all  Social Connections:   . Frequency of Communication with Friends and Family: Not on file  . Frequency of Social Gatherings with Friends and Family: Not on file  . Attends Religious Services: Not on file  . Active Member of Clubs or Organizations: Not on file  . Attends Archivist Meetings: Not on file  . Marital Status: Not on file    Tobacco Counseling Counseling given: Not Answered   Clinical Intake:  Pre-visit preparation completed: Yes  Pain : No/denies pain     Nutritional Status: BMI > 30  Obese Nutritional Risks: None Diabetes: No  How often do you need to have someone help you when you read instructions, pamphlets, or other written materials from your doctor or pharmacy?: 1 - Never What is the last grade level you completed in school?: 67yr college  Diabetic? no  Interpreter Needed?: No  Information entered by :: NAllen LPN   Activities of Daily Living In your present state of health, do you have any difficulty performing the following activities: 08/06/2020  Hearing? N  Vision? N  Difficulty concentrating or making decisions? N  Walking or climbing stairs? Y  Comment depends on if carrying things  Dressing or bathing? N  Doing errands, shopping? N  Preparing Food and eating ? N  Using the Toilet? N  In the past six months, have you accidently leaked urine? Y  Comment wears a pad, sometimes with cough  Do you have problems with loss of bowel control? N  Managing your Medications? N  Managing your Finances? N  Housekeeping or managing your  Housekeeping? N  Some recent data might be hidden    Patient Care Team: Venita Lick, NP as PCP - General (Nurse Practitioner)  Indicate any recent Medical Services you may have received from other than [facility] providers in the past year (date may be approximate).     Assessment:   This is a routine wellness examination for [place].  Hearing/Vision screen  Hearing Screening   125Hz  250Hz  500Hz  1000Hz  2000Hz  3000Hz  4000Hz  6000Hz  8000Hz   Right ear:           Left ear:           Vision Screening Comments: No regular eye exams, Dr. Ellin Mayhew  Dietary issues and exercise activities discussed: Current Exercise Habits: The patient does not participate in regular exercise at present  Goals    . Patient Stated  08/06/2020, wants to lose 50 pounds      Depression Screen PHQ 2/9 Scores 08/06/2020 07/21/2019 06/16/2019 03/13/2017  PHQ - 2 Score 0 0 0 0  PHQ- 9 Score - - 1 5    Fall Risk Fall Risk  08/06/2020 07/21/2019 03/13/2017  Falls in the past year? 0 0 No  Number falls in past yr: - 0 -  Injury with Fall? - 0 -  Risk for fall due to : Medication side effect - -  Follow up Falls evaluation completed;Education provided;Falls prevention discussed - -    Any stairs in or around the home? Yes  If so, are there any without handrails? No  Home free of loose throw rugs in walkways, pet beds, electrical cords, etc? Yes  Adequate lighting in your home to reduce risk of falls? Yes   ASSISTIVE DEVICES UTILIZED TO PREVENT FALLS:  Life alert? No  Use of a cane, walker or w/c? No  Grab bars in the bathroom? No  Shower chair or bench in shower? No  Elevated toilet seat or a handicapped toilet? No   TIMED UP AND GO:  Was the test performed? No   Cognitive Function:     6CIT Screen 08/06/2020  What Year? 0 points  What month? 0 points  What time? 0 points  Count back from 20 2 points  Months in reverse 0 points  Repeat phrase 0 points  Total Score 2     Immunizations Immunization History  Administered Date(s) Administered  . Influenza, High Dose Seasonal PF 07/24/2018, 06/27/2019  . Influenza,inj,Quad PF,6+ Mos 09/12/2016  . Influenza-Unspecified 07/02/2020  . PFIZER SARS-COV-2 Vaccination 11/18/2019, 12/09/2019, 07/06/2020  . Pneumococcal Conjugate-13 07/24/2018  . Pneumococcal Polysaccharide-23 08/23/2019  . Tdap 09/12/2016    TDAP status: Up to date Flu Vaccine status: Up to date Pneumococcal vaccine status: Up to date Covid-19 vaccine status: Completed vaccines  Qualifies for Shingles Vaccine? Yes   Zostavax completed No   Shingrix Completed?: No.    Education has been provided regarding the importance of this vaccine. Patient has been advised to call insurance company to determine out of pocket expense if they have not yet received this vaccine. Advised may also receive vaccine at local pharmacy or Health Dept. Verbalized acceptance and understanding.  Screening Tests Health Maintenance  Topic Date Due  . Hepatitis C Screening  Never done  . MAMMOGRAM  10/08/2021  . COLONOSCOPY  11/27/2023  . TETANUS/TDAP  09/12/2026  . INFLUENZA VACCINE  Completed  . DEXA SCAN  Completed  . COVID-19 Vaccine  Completed  . PNA vac Low Risk Adult  Completed    Health Maintenance  Health Maintenance Due  Topic Date Due  . Hepatitis C Screening  Never done    Colorectal cancer screening: Completed 11/27/2018. Repeat every 10 years Mammogram status: Completed 10/09/2019. Repeat every year Bone Density status: Completed 10/09/2019. Results reflect: Bone density results: NORMAL. Repeat every 0 years.  Lung Cancer Screening: (Low Dose CT Chest recommended if Age 65-80 years, 30 pack-year currently smoking OR have quit w/in 15years.) does not qualify.   Lung Cancer Screening Referral: no  Additional Screening:  Hepatitis C Screening: does qualify; due  Vision Screening: Recommended annual ophthalmology exams for early detection of  glaucoma and other disorders of the eye. Is the patient up to date with their annual eye exam?  No  Who is the provider or what is the name of the office in which the patient attends annual eye exams?  Dr. Ellin Mayhew If pt is not established with a provider, would they like to be referred to a provider to establish care? No .   Dental Screening: Recommended annual dental exams for proper oral hygiene  Community Resource Referral / Chronic Care Management: CRR required this visit?  No   CCM required this visit?  No      Plan:     I have personally reviewed and noted the following in the patient's chart:   . Medical and social history . Use of alcohol, tobacco or illicit drugs  . Current medications and supplements . Functional ability and status . Nutritional status . Physical activity . Advanced directives . List of other physicians . Hospitalizations, surgeries, and ER visits in previous 12 months . Vitals . Screenings to include cognitive, depression, and falls . Referrals and appointments  In addition, I have reviewed and discussed with patient certain preventive protocols, quality metrics, and best practice recommendations. A written personalized care plan for preventive services as well as general preventive health recommendations were provided to patient.     Kellie Simmering, LPN   01/05/6437   Nurse Notes:

## 2020-08-06 NOTE — Patient Instructions (Signed)
Kiara Parker , Thank you for taking time to come for your Medicare Wellness Visit. I appreciate your ongoing commitment to your health goals. Please review the following plan we discussed and let me know if I can assist you in the future.   Screening recommendations/referrals: Colonoscopy: completed 11/27/2018 Mammogram: completed 10/09/2019, due 10/08/2020 Bone Density: completed 10/09/2019 Recommended yearly ophthalmology/optometry visit for glaucoma screening and checkup Recommended yearly dental visit for hygiene and checkup  Vaccinations: Influenza vaccine: completed 07/02/2020, due 04/25/2021 Pneumococcal vaccine: completed 08/23/2019 Tdap vaccine: completed 09/12/2016, due 09/12/2026 Shingles vaccine: discussed   Covid-19: 11/18/2019, 12/09/2019, 07/06/2020  Advanced directives: Advance directive discussed with you today.   Conditions/risks identified: none  Next appointment: Follow up in one year for your annual wellness visit    Preventive Care 65 Years and Older, Female Preventive care refers to lifestyle choices and visits with your health care provider that can promote health and wellness. What does preventive care include?  A yearly physical exam. This is also called an annual well check.  Dental exams once or twice a year.  Routine eye exams. Ask your health care provider how often you should have your eyes checked.  Personal lifestyle choices, including:  Daily care of your teeth and gums.  Regular physical activity.  Eating a healthy diet.  Avoiding tobacco and drug use.  Limiting alcohol use.  Practicing safe sex.  Taking low-dose aspirin every day.  Taking vitamin and mineral supplements as recommended by your health care provider. What happens during an annual well check? The services and screenings done by your health care provider during your annual well check will depend on your age, overall health, lifestyle risk factors, and family history of  disease. Counseling  Your health care provider may ask you questions about your:  Alcohol use.  Tobacco use.  Drug use.  Emotional well-being.  Home and relationship well-being.  Sexual activity.  Eating habits.  History of falls.  Memory and ability to understand (cognition).  Work and work Statistician.  Reproductive health. Screening  You may have the following tests or measurements:  Height, weight, and BMI.  Blood pressure.  Lipid and cholesterol levels. These may be checked every 5 years, or more frequently if you are over 62 years old.  Skin check.  Lung cancer screening. You may have this screening every year starting at age 72 if you have a 30-pack-year history of smoking and currently smoke or have quit within the past 15 years.  Fecal occult blood test (FOBT) of the stool. You may have this test every year starting at age 37.  Flexible sigmoidoscopy or colonoscopy. You may have a sigmoidoscopy every 5 years or a colonoscopy every 10 years starting at age 18.  Hepatitis C blood test.  Hepatitis B blood test.  Sexually transmitted disease (STD) testing.  Diabetes screening. This is done by checking your blood sugar (glucose) after you have not eaten for a while (fasting). You may have this done every 1-3 years.  Bone density scan. This is done to screen for osteoporosis. You may have this done starting at age 75.  Mammogram. This may be done every 1-2 years. Talk to your health care provider about how often you should have regular mammograms. Talk with your health care provider about your test results, treatment options, and if necessary, the need for more tests. Vaccines  Your health care provider may recommend certain vaccines, such as:  Influenza vaccine. This is recommended every year.  Tetanus, diphtheria,  and acellular pertussis (Tdap, Td) vaccine. You may need a Td booster every 10 years.  Zoster vaccine. You may need this after age  72.  Pneumococcal 13-valent conjugate (PCV13) vaccine. One dose is recommended after age 48.  Pneumococcal polysaccharide (PPSV23) vaccine. One dose is recommended after age 49. Talk to your health care provider about which screenings and vaccines you need and how often you need them. This information is not intended to replace advice given to you by your health care provider. Make sure you discuss any questions you have with your health care provider. Document Released: 10/08/2015 Document Revised: 05/31/2016 Document Reviewed: 07/13/2015 Elsevier Interactive Patient Education  2017 [place] Prevention in the Home Falls can cause injuries. They can happen to people of all ages. There are many things you can do to make your home safe and to help prevent falls. What can I do on the outside of my home?  Regularly fix the edges of walkways and driveways and fix any cracks.  Remove anything that might make you trip as you walk through a door, such as a raised step or threshold.  Trim any bushes or trees on the path to your home.  Use bright outdoor lighting.  Clear any walking paths of anything that might make someone trip, such as rocks or tools.  Regularly check to see if handrails are loose or broken. Make sure that both sides of any steps have handrails.  Any raised decks and porches should have guardrails on the edges.  Have any leaves, snow, or ice cleared regularly.  Use sand or salt on walking paths during winter.  Clean up any spills in your garage right away. This includes oil or grease spills. What can I do in the bathroom?  Use night lights.  Install grab bars by the toilet and in the tub and shower. Do not use towel bars as grab bars.  Use non-skid mats or decals in the tub or shower.  If you need to sit down in the shower, use a plastic, non-slip stool.  Keep the floor dry. Clean up any water that spills on the floor as soon as it happens.  Remove  soap buildup in the tub or shower regularly.  Attach bath mats securely with double-sided non-slip rug tape.  Do not have throw rugs and other things on the floor that can make you trip. What can I do in the bedroom?  Use night lights.  Make sure that you have a light by your bed that is easy to reach.  Do not use any sheets or blankets that are too big for your bed. They should not hang down onto the floor.  Have a firm chair that has side arms. You can use this for support while you get dressed.  Do not have throw rugs and other things on the floor that can make you trip. What can I do in the kitchen?  Clean up any spills right away.  Avoid walking on wet floors.  Keep items that you use a lot in easy-to-reach places.  If you need to reach something above you, use a strong step stool that has a grab bar.  Keep electrical cords out of the way.  Do not use floor polish or wax that makes floors slippery. If you must use wax, use non-skid floor wax.  Do not have throw rugs and other things on the floor that can make you trip. What can I do with my  stairs?  Do not leave any items on the stairs.  Make sure that there are handrails on both sides of the stairs and use them. Fix handrails that are broken or loose. Make sure that handrails are as long as the stairways.  Check any carpeting to make sure that it is firmly attached to the stairs. Fix any carpet that is loose or worn.  Avoid having throw rugs at the top or bottom of the stairs. If you do have throw rugs, attach them to the floor with carpet tape.  Make sure that you have a light switch at the top of the stairs and the bottom of the stairs. If you do not have them, ask someone to add them for you. What else can I do to help prevent falls?  Wear shoes that:  Do not have high heels.  Have rubber bottoms.  Are comfortable and fit you well.  Are closed at the toe. Do not wear sandals.  If you use a  stepladder:  Make sure that it is fully opened. Do not climb a closed stepladder.  Make sure that both sides of the stepladder are locked into place.  Ask someone to hold it for you, if possible.  Clearly mark and make sure that you can see:  Any grab bars or handrails.  First and last steps.  Where the edge of each step is.  Use tools that help you move around (mobility aids) if they are needed. These include:  Canes.  Walkers.  Scooters.  Crutches.  Turn on the lights when you go into a dark area. Replace any light bulbs as soon as they burn out.  Set up your furniture so you have a clear path. Avoid moving your furniture around.  If any of your floors are uneven, fix them.  If there are any pets around you, be aware of where they are.  Review your medicines with your doctor. Some medicines can make you feel dizzy. This can increase your chance of falling. Ask your doctor what other things that you can do to help prevent falls. This information is not intended to replace advice given to you by your health care provider. Make sure you discuss any questions you have with your health care provider. Document Released: 07/08/2009 Document Revised: 02/17/2016 Document Reviewed: 10/16/2014 Elsevier Interactive Patient Education  2017 [employer].

## 2020-08-24 ENCOUNTER — Ambulatory Visit (INDEPENDENT_AMBULATORY_CARE_PROVIDER_SITE_OTHER): Payer: Medicare HMO | Admitting: Nurse Practitioner

## 2020-08-24 ENCOUNTER — Encounter: Payer: Self-pay | Admitting: Nurse Practitioner

## 2020-08-24 ENCOUNTER — Other Ambulatory Visit: Payer: Self-pay

## 2020-08-24 DIAGNOSIS — M48061 Spinal stenosis, lumbar region without neurogenic claudication: Secondary | ICD-10-CM | POA: Diagnosis not present

## 2020-08-24 NOTE — Patient Instructions (Signed)

## 2020-08-24 NOTE — Progress Notes (Signed)
BP 121/85   Pulse 76   Temp 97.7 F (36.5 C)   Wt 228 lb (103.4 kg)   LMP  (LMP Unknown)   SpO2 95%   BMI 39.14 kg/m    Subjective:    Patient ID: Kiara Parker, female    DOB: [birth year], 69 y.o.   MRN: [id number]  HPI: Kiara Parker is a 69 y.o. female  Chief Complaint  Patient presents with  . Back Pain    pt states she would like referall to get shots in her back for her back pain, states pain is getting worse    BACK PAIN Has been struggling with this for years she reports, probably about 6 years ago she obtained "shots in back".  She reports Dr. Annette Stable did this at the PCP clinic years ago.  Dr. Annette Stable with neurosurgery.  Had MRI lumbar spine in 2014 noting multilevel disc "degeneration with particular prominent degeneration and facet hypertrophy at L4-L5 resulting in prominent spinal stenosis at this level". Duration: years Mechanism of injury: no trauma Location: L>R, midline and low back Onset: gradual Severity: 9/10 at worst Quality: sharp, aching, throbbing Frequency: intermittent Radiation: L leg above the knee Aggravating factors: movement, walking and bending Alleviating factors: heat, NSAIDs and muscle relaxer Status: worse Treatments attempted: rest, heat and aleve  Relief with NSAIDs?: moderate Nighttime pain:  no Paresthesias / decreased sensation:  no Bowel / bladder incontinence:  no Fevers:  no Dysuria / urinary frequency:  no  Relevant past medical, surgical, family and social history reviewed and updated as indicated. Interim medical history since our last visit reviewed. Allergies and medications reviewed and updated.  Review of Systems  Constitutional: Negative for activity change, appetite change, diaphoresis, fatigue and fever.  Respiratory: Negative for cough, chest tightness and shortness of breath.   Cardiovascular: Negative for chest pain, palpitations and leg swelling.  Gastrointestinal: Negative.   Endocrine: Negative.    Musculoskeletal: Positive for back pain.  Neurological: Negative.   Psychiatric/Behavioral: Negative.     Per HPI unless specifically indicated above     Objective:    BP 121/85   Pulse 76   Temp 97.7 F (36.5 C)   Wt 228 lb (103.4 kg)   LMP  (LMP Unknown)   SpO2 95%   BMI 39.14 kg/m   Wt Readings from Last 3 Encounters:  08/24/20 228 lb (103.4 kg)  08/06/20 220 lb (99.8 kg)  07/21/19 220 lb (99.8 kg)    Physical Exam Vitals and nursing note reviewed.  Constitutional:      General: She is awake. She is not in acute distress.    Appearance: She is well-developed. She is obese. She is not ill-appearing.  HENT:     Head: Normocephalic.     Right Ear: Hearing normal.     Left Ear: Hearing normal.  Eyes:     General: Lids are normal.        Right eye: No discharge.        Left eye: No discharge.     Conjunctiva/sclera: Conjunctivae normal.     Pupils: Pupils are equal, round, and reactive to light.  Neck:     Vascular: No carotid bruit.  Cardiovascular:     Rate and Rhythm: Normal rate and regular rhythm.     Heart sounds: Normal heart sounds. No murmur heard.  No gallop.   Pulmonary:     Effort: Pulmonary effort is normal. No accessory muscle usage or respiratory distress.  Breath sounds: Normal breath sounds.  Abdominal:     General: Bowel sounds are normal.     Palpations: Abdomen is soft.  Musculoskeletal:     Cervical back: Normal range of motion and neck supple.     Lumbar back: No swelling, deformity, spasms, tenderness or bony tenderness. Decreased range of motion. Positive left straight leg raise test. Negative right straight leg raise test.     Right lower leg: No edema.     Left lower leg: No edema.     Comments: Antalgic gait.  Skin:    General: Skin is warm and dry.  Neurological:     Mental Status: She is alert and oriented to person, place, and time.  Psychiatric:        Attention and Perception: Attention normal.        Mood and Affect:  Mood normal.        Behavior: Behavior normal. Behavior is cooperative.        Thought Content: Thought content normal.        Judgment: Judgment normal.     Results for orders placed or performed in visit on 07/06/20  VITAMIN D 25 Hydroxy (Vit-D Deficiency, Fractures)  Result Value Ref Range   Vit D, 25-Hydroxy 35.2 30.0 - 100.0 ng/mL  Lipid Panel w/o Chol/HDL Ratio  Result Value Ref Range   Cholesterol, Total 175 100 - 199 mg/dL   Triglycerides 153 (H) 0 - 149 mg/dL   HDL 43 >39 mg/dL   VLDL Cholesterol Cal 27 5 - 40 mg/dL   LDL Chol Calc (NIH) 105 (H) 0 - 99 mg/dL  Basic metabolic panel  Result Value Ref Range   Glucose 114 (H) 65 - 99 mg/dL   BUN 15 8 - 27 mg/dL   Creatinine, Ser 0.79 0.57 - 1.00 mg/dL   GFR calc non Af Amer 78 >59 mL/min/1.73   GFR calc Af Amer 90 >59 mL/min/1.73   BUN/Creatinine Ratio 19 12 - 28   Sodium 141 134 - 144 mmol/L   Potassium 4.2 3.5 - 5.2 mmol/L   Chloride 102 96 - 106 mmol/L   CO2 26 20 - 29 mmol/L   Calcium 9.4 8.7 - 10.3 mg/dL  Bayer DCA Hb A1c Waived  Result Value Ref Range   HB A1C (BAYER DCA - WAIVED) 6.0 <7.0 %      Assessment & Plan:   Problem List Items Addressed This Visit      Other   Spinal stenosis    Chronic, with current worsening back pain, wishes to return to neurosurgery.  This is appropriate at this time.  In past did well with injections.  Have recommended modest weight loss.  Continue Baclofen and Gabapentin at home.  Wishes to hold off on Prednisone at this time due to her prediabetes.  Referral to Dr. Annette Stable placed for further recommendations.      Relevant Orders   Ambulatory referral to Neurosurgery   Morbid obesity ([place]) - Primary    Recommended eating smaller high protein, low fat meals more frequently and exercising 30 mins a day 5 times a week with a goal of 10-15lb weight loss in the next 3 months. Patient voiced their understanding and motivation to adhere to these recommendations.           Follow  up plan: Return if symptoms worsen or fail to improve.

## 2020-08-24 NOTE — Assessment & Plan Note (Signed)
Chronic, with current worsening back pain, wishes to return to neurosurgery.  This is appropriate at this time.  In past did well with injections.  Have recommended modest weight loss.  Continue Baclofen and Gabapentin at home.  Wishes to hold off on Prednisone at this time due to her prediabetes.  Referral to Dr. Annette Stable placed for further recommendations.

## 2020-08-24 NOTE — Assessment & Plan Note (Signed)
Recommended eating smaller high protein, low fat meals more frequently and exercising 30 mins a day 5 times a week with a goal of 10-15lb weight loss in the next 3 months. Patient voiced their understanding and motivation to adhere to these recommendations.  

## 2020-09-01 DIAGNOSIS — I350 Nonrheumatic aortic (valve) stenosis: Secondary | ICD-10-CM | POA: Diagnosis not present

## 2020-09-01 DIAGNOSIS — I10 Essential (primary) hypertension: Secondary | ICD-10-CM | POA: Diagnosis not present

## 2020-09-01 DIAGNOSIS — I119 Hypertensive heart disease without heart failure: Secondary | ICD-10-CM | POA: Diagnosis not present

## 2020-09-01 DIAGNOSIS — E782 Mixed hyperlipidemia: Secondary | ICD-10-CM | POA: Diagnosis not present

## 2020-09-01 DIAGNOSIS — Z6837 Body mass index (BMI) 37.0-37.9, adult: Secondary | ICD-10-CM | POA: Diagnosis not present

## 2020-09-03 ENCOUNTER — Other Ambulatory Visit: Payer: Self-pay | Admitting: Nurse Practitioner

## 2020-09-03 NOTE — Telephone Encounter (Signed)
Admin: Please get patient scheduled [name]: Can she have a refill

## 2020-09-03 NOTE — Telephone Encounter (Signed)
Requested medications are due for refill today yes  Requested medications are on the active medication list yes  Last refill 9/17  Last visit more than a year ago.  Notes to clinic Failed protocol due to no valid visit within 12  months.

## 2020-09-03 NOTE — Telephone Encounter (Signed)
Lvm to make apt.  

## 2020-09-13 DIAGNOSIS — M5126 Other intervertebral disc displacement, lumbar region: Secondary | ICD-10-CM | POA: Diagnosis not present

## 2020-09-13 DIAGNOSIS — M5416 Radiculopathy, lumbar region: Secondary | ICD-10-CM | POA: Insufficient documentation

## 2020-09-13 DIAGNOSIS — Z6838 Body mass index (BMI) 38.0-38.9, adult: Secondary | ICD-10-CM | POA: Insufficient documentation

## 2020-09-13 DIAGNOSIS — Z6839 Body mass index (BMI) 39.0-39.9, adult: Secondary | ICD-10-CM | POA: Insufficient documentation

## 2020-09-13 DIAGNOSIS — R030 Elevated blood-pressure reading, without diagnosis of hypertension: Secondary | ICD-10-CM | POA: Insufficient documentation

## 2020-10-01 DIAGNOSIS — M5416 Radiculopathy, lumbar region: Secondary | ICD-10-CM | POA: Diagnosis not present

## 2020-10-01 DIAGNOSIS — Z6838 Body mass index (BMI) 38.0-38.9, adult: Secondary | ICD-10-CM | POA: Diagnosis not present

## 2020-10-01 DIAGNOSIS — M5126 Other intervertebral disc displacement, lumbar region: Secondary | ICD-10-CM | POA: Diagnosis not present

## 2020-10-18 DIAGNOSIS — Z0100 Encounter for examination of eyes and vision without abnormal findings: Secondary | ICD-10-CM | POA: Diagnosis not present

## 2020-10-18 DIAGNOSIS — H5203 Hypermetropia, bilateral: Secondary | ICD-10-CM | POA: Diagnosis not present

## 2020-10-18 DIAGNOSIS — H2513 Age-related nuclear cataract, bilateral: Secondary | ICD-10-CM | POA: Diagnosis not present

## 2020-10-18 DIAGNOSIS — E119 Type 2 diabetes mellitus without complications: Secondary | ICD-10-CM | POA: Diagnosis not present

## 2020-10-18 DIAGNOSIS — H353131 Nonexudative age-related macular degeneration, bilateral, early dry stage: Secondary | ICD-10-CM | POA: Diagnosis not present

## 2020-10-18 LAB — HM DIABETES EYE EXAM

## 2020-10-29 ENCOUNTER — Ambulatory Visit (INDEPENDENT_AMBULATORY_CARE_PROVIDER_SITE_OTHER): Payer: Medicare HMO | Admitting: Nurse Practitioner

## 2020-10-29 ENCOUNTER — Encounter: Payer: Self-pay | Admitting: Nurse Practitioner

## 2020-10-29 ENCOUNTER — Ambulatory Visit
Admission: RE | Admit: 2020-10-29 | Discharge: 2020-10-29 | Disposition: A | Discharge status: Home / Self Care | Payer: Medicare HMO | Source: Ambulatory Visit | Attending: Nurse Practitioner | Admitting: Nurse Practitioner

## 2020-10-29 ENCOUNTER — Other Ambulatory Visit: Payer: Self-pay

## 2020-10-29 DIAGNOSIS — I10 Essential (primary) hypertension: Secondary | ICD-10-CM | POA: Diagnosis not present

## 2020-10-29 DIAGNOSIS — R7303 Prediabetes: Secondary | ICD-10-CM

## 2020-10-29 DIAGNOSIS — M48061 Spinal stenosis, lumbar region without neurogenic claudication: Secondary | ICD-10-CM

## 2020-10-29 DIAGNOSIS — M25552 Pain in left hip: Secondary | ICD-10-CM | POA: Diagnosis not present

## 2020-10-29 LAB — MICROALBUMIN, URINE WAIVED
Creatinine, Urine Waived: 200 mg/dL (ref 10–300)
Microalb, Ur Waived: 10 mg/L (ref 0–19)
Microalb/Creat Ratio: <30 mg/g (ref <30)

## 2020-10-29 NOTE — Assessment & Plan Note (Signed)
BMI 39.48 with HTN and Prediabetes.  Recommended eating smaller high protein, low fat meals more frequently and exercising 30 mins a day 5 times a week with a goal of 10-15lb weight loss in the next 3 months. Patient voiced their understanding and motivation to adhere to these recommendations.

## 2020-10-29 NOTE — Assessment & Plan Note (Signed)
Chronic, stable with BP below goal today. Continue low dose HCTZ.  Recommend she monitor BP at least a few days a week at home and document.  Obtain BMP.  Continue collaboration with cardiology.  Return in 6 months.

## 2020-10-29 NOTE — Patient Instructions (Signed)
[address] [place], [place] [address] -- imaging location   Hip Pain The hip is the joint between the upper legs and the lower pelvis. The bones, cartilage, tendons, and muscles of your hip joint support your body and allow you to move around. Hip pain can range from a minor ache to severe pain in one or both of your hips. The pain may be felt on the inside of the hip joint near the groin, or on the outside near the buttocks and upper thigh. You may also have swelling or stiffness in your hip area. Follow these instructions at home: Managing pain, stiffness, and swelling  If directed, put ice on the painful area. To do this: ? Put ice in a plastic bag. ? Place a towel between your skin and the bag. ? Leave the ice on for 20 minutes, 2-3 times a day.  If directed, apply heat to the affected area as often as told by your health care provider. Use the heat source that your health care provider recommends, such as a moist heat pack or a heating pad. ? Place a towel between your skin and the heat source. ? Leave the heat on for 20-30 minutes. ? Remove the heat if your skin turns bright red. This is especially important if you are unable to feel pain, heat, or cold. You may have a greater risk of getting burned.      Activity  Do exercises as told by your health care provider.  Avoid activities that cause pain. General instructions  Take over-the-counter and prescription medicines only as told by your health care provider.  Keep a journal of your symptoms. Write down: ? How often you have hip pain. ? The location of your pain. ? What the pain feels like. ? What makes the pain worse.  Sleep with a pillow between your legs on your most comfortable side.  Keep all follow-up visits as told by your health care provider. This is important.   Contact a health care provider if:  You cannot put weight on your leg.  Your pain or swelling continues or gets worse after one week.  It gets harder  to walk.  You have a fever. Get help right away if:  You fall.  You have a sudden increase in pain and swelling in your hip.  Your hip is red or swollen or very tender to touch. Summary  Hip pain can range from a minor ache to severe pain in one or both of your hips.  The pain may be felt on the inside of the hip joint near the groin, or on the outside near the buttocks and upper thigh.  Avoid activities that cause pain.  Write down how often you have hip pain, the location of the pain, what makes it worse, and what it feels like. This information is not intended to replace advice given to you by your health care provider. Make sure you discuss any questions you have with your health care provider. Document Revised: 01/27/2019 Document Reviewed: 01/27/2019 Elsevier Patient Education  [place].

## 2020-10-29 NOTE — Assessment & Plan Note (Signed)
Chronic, with current worsening back pain and left hip pain.  Continue to collaborate with neurosurgery, has another injection next week.  Have recommended modest weight loss.  Continue Baclofen and Gabapentin at home.  Referral to PT placed and order for left hip imaging to further assess area.  No red flag symptoms today.  Return in 8 weeks.

## 2020-10-29 NOTE — Progress Notes (Signed)
BP 112/73   Pulse 71   Temp 98.4 F (36.9 C) (Oral)   Ht 5' 3.39" (1.61 m)   Wt 225 lb 9.6 oz (102.3 kg)   LMP  (LMP Unknown)   SpO2 93%   BMI 39.48 kg/m    Subjective:    Patient ID: Kiara Parker, female    DOB: [birth year], 70 y.o.   MRN: [id number]  HPI: Kiara Parker is a 70 y.o. female  Chief Complaint  Patient presents with  . Back Pain    Pain is now going across the back, left hip and left leg. Had cortisone injection in back on 12/7, pain returned 3 days later. This past week has been just terrible. Patient states that sitting or laying down is ok, pain increase when she stands or walks.    BACK PAIN Follow-up for back pain, last visit in November 2021.  Has been struggling with this for years she reports, probably about 6 years ago she obtained back injections.  Was referred to neurosurgery in November 2021 -- Dr. Annette Stable who cared for her in past.  Did see neurosurgery at [facility] on December 7th, placed injection at that time.  Not working 100%, states pain is "bad if not worse".  Feels worse in left hip at this time then back -- this has been since injection.  Returns to see neurosurgery on 11/05/20 for second injection to different area.  Is taking Gabapentin 3 times a day + Baclofen at bedtime + Meloxicam daily.    Had MRI lumbar spine in 2014 noting multilevel disc "degeneration with particular prominent degeneration and facet hypertrophy at L4-L5 resulting in prominent spinal stenosis at this level".  Recent bone density on 10/09/19 was normal. Duration: years Mechanism of injury: no trauma Location: L>R, midline and low back + left hip Onset: gradual Severity:10/10 at worst Quality: sharp, aching, throbbing Frequency: intermittent Radiation: L leg above the knee Aggravating factors: movement, walking and bending -- walking especially Alleviating factors: heat, NSAIDs and muscle relaxer and medications noted above in HPI -- using Voltaren gel,  laying down makes it feel better -- can not sleep on left side Status: worse Treatments attempted: rest, heat and aleve and medications noted above Relief with NSAIDs?: moderate Nighttime pain:  yes, only if lies on left side Paresthesias / decreased sensation:  no Bowel / bladder incontinence:  no Fevers:  no Dysuria / urinary frequency:  no  Relevant past medical, surgical, family and social history reviewed and updated as indicated. Interim medical history since our last visit reviewed. Allergies and medications reviewed and updated.  Review of Systems  Constitutional: Negative for activity change, appetite change, diaphoresis, fatigue and fever.  Respiratory: Negative for cough, chest tightness and shortness of breath.   Cardiovascular: Negative for chest pain, palpitations and leg swelling.  Gastrointestinal: Negative.   Endocrine: Negative.   Musculoskeletal: Positive for back pain.  Neurological: Negative.   Psychiatric/Behavioral: Negative.     Per HPI unless specifically indicated above     Objective:    BP 112/73   Pulse 71   Temp 98.4 F (36.9 C) (Oral)   Ht 5' 3.39" (1.61 m)   Wt 225 lb 9.6 oz (102.3 kg)   LMP  (LMP Unknown)   SpO2 93%   BMI 39.48 kg/m   Wt Readings from Last 3 Encounters:  10/29/20 225 lb 9.6 oz (102.3 kg)  08/24/20 228 lb (103.4 kg)  08/06/20 220 lb (99.8 kg)  Physical Exam Vitals and nursing note reviewed.  Constitutional:      General: She is awake. She is not in acute distress.    Appearance: She is well-developed. She is obese. She is not ill-appearing.  HENT:     Head: Normocephalic.     Right Ear: Hearing normal.     Left Ear: Hearing normal.  Eyes:     General: Lids are normal.        Right eye: No discharge.        Left eye: No discharge.     Conjunctiva/sclera: Conjunctivae normal.     Pupils: Pupils are equal, round, and reactive to light.  Neck:     Vascular: No carotid bruit.  Cardiovascular:     Rate and  Rhythm: Normal rate and regular rhythm.     Heart sounds: Normal heart sounds. No murmur heard. No gallop.   Pulmonary:     Effort: Pulmonary effort is normal. No accessory muscle usage or respiratory distress.     Breath sounds: Normal breath sounds.  Abdominal:     General: Bowel sounds are normal.     Palpations: Abdomen is soft.  Musculoskeletal:     Cervical back: Normal range of motion and neck supple.     Lumbar back: No swelling, deformity, spasms, tenderness or bony tenderness. Decreased range of motion. Negative right straight leg raise test and negative left straight leg raise test.     Right hip: Normal.     Left hip: Tenderness and crepitus present. No deformity or bony tenderness. Normal range of motion. Decreased strength.     Right lower leg: No edema.     Left lower leg: No edema.     Comments: Antalgic gait.  Skin:    General: Skin is warm and dry.  Neurological:     Mental Status: She is alert and oriented to person, place, and time.  Psychiatric:        Attention and Perception: Attention normal.        Mood and Affect: Mood normal.        Behavior: Behavior normal. Behavior is cooperative.        Thought Content: Thought content normal.        Judgment: Judgment normal.    PREDIABETES Last check 6.1% in October, has received steroid injection.   Polydipsia/polyuria: no Visual disturbance: no Chest pain: no Paresthesias: no   Results for orders placed or performed in visit on 10/18/20  HM DIABETES EYE EXAM  Result Value Ref Range   HM Diabetic Eye Exam No Retinopathy No Retinopathy      Assessment & Plan:   Problem List Items Addressed This Visit      Cardiovascular and Mediastinum   Hypertension    Chronic, stable with BP below goal today. Continue low dose HCTZ.  Recommend she monitor BP at least a few days a week at home and document.  Obtain BMP.  Continue collaboration with cardiology.  Return in 6 months.        Other   Spinal stenosis     Chronic, with current worsening back pain and left hip pain.  Continue to collaborate with neurosurgery, has another injection next week.  Have recommended modest weight loss.  Continue Baclofen and Gabapentin at home.  Referral to PT placed and order for left hip imaging to further assess area.  No red flag symptoms today.  Return in 8 weeks.      Relevant Orders  DG HIP UNILAT W OR W/O PELVIS 2-3 VIEWS LEFT   Ambulatory referral to Physical Therapy   Morbid obesity ([place]) - Primary    BMI 39.48 with HTN and Prediabetes.  Recommended eating smaller high protein, low fat meals more frequently and exercising 30 mins a day 5 times a week with a goal of 10-15lb weight loss in the next 3 months. Patient voiced their understanding and motivation to adhere to these recommendations.       Prediabetes    Ongoing with A1C last 6.1%, she continues to be between 5.6 to 6.1% on review.  Will adjust plan of care and add medication as needed.  Continue diet focus at this time.  A1C and BMP today + urine ALB.      Relevant Orders   HgB 123456   Basic metabolic panel   Microalbumin, Urine Waived       Follow up plan: Return in about 8 weeks (around 12/24/2020) for HTN/HLD, IFG, BACK PAIN.

## 2020-10-29 NOTE — Assessment & Plan Note (Signed)
Ongoing with A1C last 6.1%, she continues to be between 5.6 to 6.1% on review.  Will adjust plan of care and add medication as needed.  Continue diet focus at this time.  A1C and BMP today + urine ALB.

## 2020-10-30 LAB — BASIC METABOLIC PANEL
BUN/Creatinine Ratio: 13 (ref 12–28)
BUN: 12 mg/dL (ref 8–27)
CO2: 29 mmol/L (ref 20–29)
Calcium: 9.7 mg/dL (ref 8.7–10.3)
Chloride: 99 mmol/L (ref 96–106)
Creatinine, Ser: 0.95 mg/dL (ref 0.57–1.00)
GFR calc Af Amer: 71 mL/min/{1.73_m2} (ref >59)
GFR calc non Af Amer: 62 mL/min/{1.73_m2} (ref >59)
Glucose: 115 mg/dL — ABNORMAL HIGH (ref 65–99)
Potassium: 3.9 mmol/L (ref 3.5–5.2)
Sodium: 140 mmol/L (ref 134–144)

## 2020-10-30 LAB — HEMOGLOBIN A1C
Est. average glucose Bld gHb Est-mCnc: 134 mg/dL
Hgb A1c MFr Bld: 6.3 % — ABNORMAL HIGH (ref 4.8–5.6)

## 2020-10-31 NOTE — Progress Notes (Signed)
Please let Naimah know her labs and imaging have returned.  Labs continues to show some prediabetes, with A1c trending up to 6.3%.  Goal is to not have this get to 6.5% or more, which is considered diabetes.  Kidney function is stable.  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  This could also be causing some underlying discomfort, along with her back.  We will see how next injection with neurosurgery goes.  If any questions let me know. Keep being awesome!!  Thank you for allowing me to participate in your care. Kindest regards, Brion Hedges

## 2020-11-05 DIAGNOSIS — M5416 Radiculopathy, lumbar region: Secondary | ICD-10-CM | POA: Diagnosis not present

## 2020-11-05 DIAGNOSIS — M5126 Other intervertebral disc displacement, lumbar region: Secondary | ICD-10-CM | POA: Diagnosis not present

## 2020-11-05 DIAGNOSIS — I10 Essential (primary) hypertension: Secondary | ICD-10-CM | POA: Diagnosis not present

## 2020-11-05 DIAGNOSIS — Z6839 Body mass index (BMI) 39.0-39.9, adult: Secondary | ICD-10-CM | POA: Diagnosis not present

## 2020-11-30 ENCOUNTER — Other Ambulatory Visit: Payer: Self-pay

## 2020-11-30 ENCOUNTER — Ambulatory Visit: Payer: Medicare HMO | Attending: Nurse Practitioner

## 2020-11-30 DIAGNOSIS — G8929 Other chronic pain: Secondary | ICD-10-CM

## 2020-11-30 DIAGNOSIS — M48061 Spinal stenosis, lumbar region without neurogenic claudication: Secondary | ICD-10-CM | POA: Insufficient documentation

## 2020-11-30 DIAGNOSIS — M545 Low back pain, unspecified: Secondary | ICD-10-CM | POA: Diagnosis not present

## 2020-11-30 NOTE — Therapy (Signed)
[facility] [address], [place], [address] Phone: [phone]   Fax:  [phone]  Physical Therapy Evaluation  Patient Details  Name: Kiara Parker MRN: [id number] Date of Birth: [birth year] Referring Provider (PT): Marnee Guarneri NP (Dr. Jeanie Cooks)   Encounter Date: 11/30/2020   PT End of Session - 11/30/20 1708    Visit Number 1    Number of Visits 17    Date for PT Re-Evaluation 02/22/21    Authorization Type [employer]    Authorization Time Period 11/30/20-02/22/21    PT Start Time 1625    PT Stop Time 1710    PT Time Calculation (min) 45 min    Activity Tolerance Patient tolerated treatment well;Patient limited by pain    Behavior During Therapy [facility] for tasks assessed/performed           Past Medical History:  Diagnosis Date  . Chronic pain syndrome   . Hyperlipidemia   . Hypertension   . Lumbago   . Spinal stenosis     Past Surgical History:  Procedure Laterality Date  . COLONOSCOPY WITH PROPOFOL N/A 11/27/2018   Procedure: COLONOSCOPY WITH PROPOFOL;  Surgeon: Jonathon Bellows, MD;  Location: [facility] END[facility]COPY;  Service: Gastroenterology;  Laterality: N/A;  . TUBAL LIGATION      There were no vitals filed for this visit.    Subjective Assessment - 11/30/20 1634    Subjective Pt referred here for ongoing lumbar spinal stenosis. Pt followed by [facility] for ~ 6 years, declined recommendations for back surgery years ago. Pt has been unable to make time to FU with docs as she has been fully occupied caring for her now deceased mother.    Pertinent History Chronic progressive lumbar spine disease, history of injections spinal injections, Years of back pain, followed by [facility] for 7+ years, but in the past 6 months now tolerates only ~5 minutes of standing, ~5 minutes of walking. Recent radiacular left lateral thigh pain now abated after epidural injection.    How long  can you sit comfortably? unlimited    How long can you stand comfortably? ~5 minutes    How long can you walk comfortably? ~5 minutes    Currently in Pain? Yes    Pain Score 3     Pain Location Back    Pain Descriptors / Indicators --   lumbosacral junction (Left lateral thigh pain resolved with epidural injection)         Assessment: Gait analysis, overground, 341ft, no device, no gross unsteadiness, no LOB -gait speed between 0.40-0.50m/s  -Rt lateral shift (correlates well with recent left radiculopathy) -RLE load acceptance occurs too quickly and with poorly mediated shock absorption, pelvis, knee fall into abrupt ligamentous supported position -Rt abrupt knee varus (bowing) moment, indicates glute max dominant propulsive strategy, excessive abduction moment at toe off (also secondary to wide based gait) -signs of chronic genu varus stress of Right knee with elastic bowing in TKE in midstance  -no frank foot drop -apparent RLE leg length brevity, high likelihood secondary to trunk, pelvis, and hip aberrancy  -excessive anterior pelvic tilt in stance, large volume panniculus with secondary abdominal muscle insufficiency in postural control   MMT:  -Hip flexion: 4-/5 bilat (both with neurological giving way, unable to sustain resistance) -Seated Hip ABDCT: 4-/5  (both with neurological giving way, unable to sustain resistance) -Seated Hip Adduction: 4-/5 (both with neurological giving way, unable to sustain resistance) -Supine  Hip Abduction: 4-/5 Right, 3+/5 Left (increases low back pain)  -Hip ER/IR 5/5 bilat (sustained, pain-free)  -Knee flexion/extension 5/5 bilat (sustained, pain-free) -Ankle DF/PF bilat 5/5 (PF tested in sitting; could be assessed in standing at later if seen relevant)   DTR: (knee jerk only) Right normal 2+, Left brisk 2+ (both painful)   Sensation: intact BLE   5xSTS: 16.25sec (no pain, hands-free)    FOTO: attempted, but pt requires more time due to  indecisiveness, deferred to next session.   Objective measurements completed on examination: See above findings.      PT Short Term Goals - 12/01/20 0813      PT SHORT TERM GOAL #1   Title After 4 weeks pt to reports improved tolerance to walking>10 minutes with use of RW or 4WW to help manage back pain.    Baseline ~5 minutes    Time 4    Period Weeks    Status New    Target Date 12/29/20             PT Long Term Goals - 12/01/20 0814      PT LONG TERM GOAL #1   Title After 8 weeks pt to demonstrate FOTO score improvement >10 points to show improved functional ability.    Baseline to be performed at visit 2    Time 8    Period Weeks    Status New    Target Date 01/26/21      PT LONG TERM GOAL #2   Title After 8 weeks pt to demonstrate 6MWT >83ft c LRAD    Time 8    Period Weeks    Status New    Target Date 01/26/21      PT LONG TERM GOAL #3   Title Pt to demonstrate improved 5xSTS <13second to demonstrate improved hip strength and trunk control    Baseline 16 sec    Time 12    Period Weeks    Status New    Target Date 02/23/21                  Plan - 12/01/20 0801    Clinical Impression Statement Pt presenting to OPPT for evaluation of chronic low back pain. Examination revealing of weakness of hips grossly bilaterally with highly suspected neurological pathology. Pt has well developed gait deviations with resultant over-reliance of ligamentous structural stability and difficulty with active control of trunk and pelvis. Postural weakness leading to only ~5 minutes tolerances in standing, as well as walking. Pt will benefit from skilled PT intervention to address deficits/impairment identified in this examination in order to improve activity tolerance, safety, and independence in ADL, IADL, and leisure activity.    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;Yard Work;Shop    Stability/Clinical Decision Making Evolving/Moderate complexity    Clinical Decision Making Moderate    Rehab Potential Fair    PT Frequency 2x / week    PT Duration 12 weeks    PT Treatment/Interventions ADLs/Self Care Home Management;Moist Heat;Electrical Stimulation;DME Instruction;Gait training;Therapeutic exercise;Therapeutic activities;Stair training;Patient/family education    PT Next Visit Plan Needs time to complete FOTO survery before or after session; set up [place] none set up at eval    Consulted and Agree with Plan of Care Patient           Patient will benefit from skilled  therapeutic intervention in order to improve the following deficits and impairments:  Abnormal gait,Difficulty walking,Decreased strength,Postural dysfunction,Improper body mechanics,Impaired flexibility  Visit Diagnosis: Spinal stenosis of lumbar region, unspecified whether neurogenic claudication present  Chronic midline low back pain without sciatica     Problem List Patient Active Problem List   Diagnosis Date Noted  . Statin intolerance 12/06/2018  . Prediabetes 12/05/2018  . Vitamin D deficiency 11/01/2018  . Insomnia 01/16/2017  . Morbid obesity ([place]) 12/13/2016  . Hyperlipidemia   . Hypertension   . Chronic pain syndrome   . Lumbago   . Spinal stenosis    8:40 AM, 12/01/20 Etta Grandchild, PT, DPT Physical Therapist - [facility]  [phone] ([place])    [place] C 12/01/2020, 8:39 AM  [place] [facility] [address], [place], [address] Phone: [phone]   Fax:  [phone]  Name: Kiara Parker MRN: [id number] Date of Birth: 1951/05/27

## 2020-12-02 ENCOUNTER — Other Ambulatory Visit: Payer: Self-pay

## 2020-12-02 ENCOUNTER — Ambulatory Visit: Payer: Medicare HMO

## 2020-12-02 DIAGNOSIS — M545 Low back pain, unspecified: Secondary | ICD-10-CM | POA: Diagnosis not present

## 2020-12-02 DIAGNOSIS — G8929 Other chronic pain: Secondary | ICD-10-CM | POA: Diagnosis not present

## 2020-12-02 DIAGNOSIS — M48061 Spinal stenosis, lumbar region without neurogenic claudication: Secondary | ICD-10-CM | POA: Diagnosis not present

## 2020-12-02 NOTE — Therapy (Signed)
[place] [facility] [address] [place], [address] Phone: [phone]   Fax:  [phone]  Physical Therapy Treatment  Patient Details  Name: Kiara Parker MRN: [id number] Date of Birth: 10-02-1950 Referring Provider (PT): Marnee Guarneri NP (Dr. Jeanie Cooks)   Encounter Date: 12/02/2020   PT End of Session - 12/02/20 0805    Visit Number 2    Number of Visits 17    Date for PT Re-Evaluation 02/22/21    Authorization Type [employer]    Authorization Time Period 11/30/20-02/22/21    Authorization - Visit Number 1    Authorization - Number of Visits 10    PT Start Time 0805    PT Stop Time 0846    PT Time Calculation (min) 41 min    Activity Tolerance Patient tolerated treatment well;Patient limited by pain    Behavior During Therapy [facility] for tasks assessed/performed           Past Medical History:  Diagnosis Date  . Chronic pain syndrome   . Hyperlipidemia   . Hypertension   . Lumbago   . Spinal stenosis     Past Surgical History:  Procedure Laterality Date  . COLONOSCOPY WITH PROPOFOL N/A 11/27/2018   Procedure: COLONOSCOPY WITH PROPOFOL;  Surgeon: Jonathon Bellows, MD;  Location: [facility] ENDOSCOPY;  Service: Gastroenterology;  Laterality: N/A;  . TUBAL LIGATION      There were no vitals filed for this visit.   Subjective Assessment - 12/02/20 0806    Subjective Back is a little sore. 3-4/10 low back pain currently    Pertinent History Chronic progressive lumbar spine disease, history of injections spinal injections, Years of back pain, followed by [facility] for 7+ years, but in the past 6 months now tolerates only ~5 minutes of standing, ~5 minutes of walking. Recent radiacular left lateral thigh pain now abated after epidural injection.    How long can you sit comfortably? unlimited    How long can you stand comfortably? ~5 minutes    How long can you walk comfortably? ~5 minutes    Currently  in Pain? Yes    Pain Score 4                                      PT Education - 12/02/20 1602    Education Details ther-ex    Person(s) Educated Patient    Methods Explanation;Demonstration;Tactile cues;Verbal cues    Comprehension Verbalized understanding;Returned demonstration          Objectives  R lumbar paraspinal muscle tension palpated   Medbridge Access Code [email or web address]  Therapeutic exercise  Seated manually resisted L lateral shift isometrics to promote more neutral posture, PT manual resistance 10x3 with 5 second holds   Seated manually resisted L upper trunk rotation to promote L lumbar rotation and a more neutral posture 10x3 with 5 second holds   Sitting onto L hip to promote L latearl shift   Seated transversus abdominis contraction 10x5 seconds for 3 sets  Reviewed HEP. Pt demonstrated and verbalized understanding.    Time taken to help pt get started on her FOTO Pt might have answered based oin reliefe from injection as well based on conversation     Improved exercise technique, movement at target joints, use of target muscles after mod verbal, visual, tactile cues.    Response to treatment No  back pain in sitting and with gait after session.   Clinical impression Weak core strength observed with trunk exercises. Worked on decreasing R lateral shift and R lumbar rotation posture as well as improving core strength to help decrease stress to low back. Pt reports no low back pain in sitting and with gait after session. Pt will benefit from continued skilled physical therapy services to decrease pain, improve strength and function.         PT Short Term Goals - 12/01/20 0813      PT SHORT TERM GOAL #1   Title After 4 weeks pt to reports improved tolerance to walking>10 minutes with use of RW or 4WW to help manage back pain.    Baseline ~5 minutes    Time 4    Period Weeks    Status New    Target Date 12/29/20              PT Long Term Goals - 12/02/20 1603      PT LONG TERM GOAL #1   Title After 8 weeks pt to demonstrate FOTO score improvement >10 points to show improved functional ability.    Baseline to be performed at visit 2; Lumbar FOTO 36 (12/02/2020)    Time 8    Period Weeks    Status New    Target Date 01/26/21      PT LONG TERM GOAL #2   Title After 8 weeks pt to demonstrate 6MWT >89ft c LRAD    Time 8    Period Weeks    Status New    Target Date 01/26/21      PT LONG TERM GOAL #3   Title Pt to demonstrate improved 5xSTS <13second to demonstrate improved hip strength and trunk control    Baseline 16 sec    Time 12    Period Weeks    Status New    Target Date 02/23/21                 Plan - 12/02/20 0803    Clinical Impression Statement Weak core strength observed with trunk exercises. Worked on decreasing R lateral shift and R lumbar rotation posture as well as improving core strength to help decrease stress to low back. Pt reports no low back pain in sitting and with gait after session. Pt will benefit from continued skilled physical therapy services to decrease pain, improve strength and function.    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;Yard Work;Shop    Stability/Clinical Decision Making Evolving/Moderate complexity    Rehab Potential Fair    PT Frequency 2x / week    PT Duration 12 weeks    PT Treatment/Interventions ADLs/Self Care Home Management;Moist Heat;Electrical Stimulation;DME Instruction;Gait training;Therapeutic exercise;Therapeutic activities;Stair training;Patient/family education    PT Next Visit Plan Needs time to complete FOTO survery before or after session; set up [place] none set up at eval    Consulted and Agree with Plan of Care  Patient           Patient will benefit from skilled therapeutic intervention in order to improve the following deficits and impairments:  Abnormal gait,Difficulty walking,Decreased strength,Postural dysfunction,Improper body mechanics,Impaired flexibility  Visit Diagnosis: Spinal stenosis of lumbar region, unspecified whether neurogenic claudication present  Chronic midline low back pain without sciatica     Problem List Patient Active Problem List  Diagnosis Date Noted  . Statin intolerance 12/06/2018  . Prediabetes 12/05/2018  . Vitamin D deficiency 11/01/2018  . Insomnia 01/16/2017  . Morbid obesity ([place]) 12/13/2016  . Hyperlipidemia   . Hypertension   . Chronic pain syndrome   . Lumbago   . Spinal stenosis    Joneen Boers PT, DPT   12/02/2020, 4:47 PM  [place] [facility] [address] [place], [address] Phone: [phone]   Fax:  [phone]  Name: Kiara Parker MRN: [id number] Date of Birth: June 03, 1951

## 2020-12-07 ENCOUNTER — Ambulatory Visit: Payer: Medicare HMO

## 2020-12-07 ENCOUNTER — Other Ambulatory Visit: Payer: Self-pay

## 2020-12-07 DIAGNOSIS — G8929 Other chronic pain: Secondary | ICD-10-CM

## 2020-12-07 DIAGNOSIS — M48061 Spinal stenosis, lumbar region without neurogenic claudication: Secondary | ICD-10-CM | POA: Diagnosis not present

## 2020-12-07 DIAGNOSIS — M545 Low back pain, unspecified: Secondary | ICD-10-CM

## 2020-12-07 NOTE — Therapy (Signed)
[facility] [address] [place], [address] Phone: [phone]   Fax:  [phone]  Physical Therapy Treatment  Patient Details  Name: Kiara Parker MRN: [id number] Date of Birth: [birth year] Referring Provider (PT): Marnee Guarneri NP (Dr. Jeanie Cooks)   Encounter Date: 12/07/2020   PT End of Session - 12/07/20 1150    Visit Number 3    Number of Visits 17    Date for PT Re-Evaluation 02/22/21    Authorization Type [employer]    Authorization Time Period 11/30/20-02/22/21    Authorization - Visit Number 3    Authorization - Number of Visits 10    PT Start Time 6734    PT Stop Time 1231    PT Time Calculation (min) 40 min    Activity Tolerance Patient tolerated treatment well;Patient limited by pain    Behavior During Therapy [facility] for tasks assessed/performed           Past Medical History:  Diagnosis Date  . Chronic pain syndrome   . Hyperlipidemia   . Hypertension   . Lumbago   . Spinal stenosis     Past Surgical History:  Procedure Laterality Date  . COLONOSCOPY WITH PROPOFOL N/A 11/27/2018   Procedure: COLONOSCOPY WITH PROPOFOL;  Surgeon: Jonathon Bellows, MD;  Location: [facility] ENDOSCOPY;  Service: Gastroenterology;  Laterality: N/A;  . TUBAL LIGATION      There were no vitals filed for this visit.   Subjective Assessment - 12/07/20 1152    Subjective A little bit of back, 4/10 currently. It hurt for a little while after last session but did not last long.    Pertinent History Chronic progressive lumbar spine disease, history of injections spinal injections, Years of back pain, followed by [facility] for 7+ years, but in the past 6 months now tolerates only ~5 minutes of standing, ~5 minutes of walking. Recent radiacular left lateral thigh pain now abated after epidural injection.    How long can you sit comfortably? unlimited    How long can you stand comfortably? ~5 minutes    How  long can you walk comfortably? ~5 minutes    Currently in Pain? Yes    Pain Score 4     Pain Location Back                                     PT Education - 12/07/20 1155    Education Details ther-ex    Person(s) Educated Patient    Methods Explanation;Demonstration;Tactile cues;Verbal cues    Comprehension Returned demonstration;Verbalized understanding           Objectives  R lumbar paraspinal muscle tension palpated   Medbridge Access Code [email or web address]  Therapeutic exercise  Seated manually resisted L upper trunk rotation to promote L lumbar rotation and a more neutral posture 10x3 with 5 second holds   Seated manually resisted L lateral shift isometrics to promote more neutral posture, PT manual resistance 10x3 with 5 second holds   Seated hip extension isometrics  L 10x 5 seconds   Seated B scapular retraction 10x 0-5 seconds for 3 sets to promote thoracic extension to decrease lumbar extension stress  difficutly with proper motor control R >L, max cues for technique.    Seated glute max squeeze 10x with 5 second holds    Then 10x3, no holds  Standing hip abduction  with B UE assist   R 10x  L 10x  R low back pain   Improved exercise technique, movement at target joints, use of target muscles after mod verbal, visual, tactile cues.  Mod to max cues to not hold breath with exercises   Manual therapy  Seated STM to R and L lumbar and thoracolumbar paraspinal muscles to decrease tension R > L     Response to treatment No back pain in sitting after session.   Clinical impression Continued working on promoting a more neutral posture to low back as well as improving glute muscle activation and decreasing paraspinal muscle tension to decrease stress to low back.  Back feels better after session reported. Pt will benefit from continued skilled physical therapy services to decrease pain, improve strength and function.        PT Short Term Goals - 12/01/20 0813      PT SHORT TERM GOAL #1   Title After 4 weeks pt to reports improved tolerance to walking>10 minutes with use of RW or 4WW to help manage back pain.    Baseline ~5 minutes    Time 4    Period Weeks    Status New    Target Date 12/29/20             PT Long Term Goals - 12/02/20 1603      PT LONG TERM GOAL #1   Title After 8 weeks pt to demonstrate FOTO score improvement >10 points to show improved functional ability.    Baseline to be performed at visit 2; Lumbar FOTO 36 (12/02/2020)    Time 8    Period Weeks    Status New    Target Date 01/26/21      PT LONG TERM GOAL #2   Title After 8 weeks pt to demonstrate 6MWT >849ft c LRAD    Time 8    Period Weeks    Status New    Target Date 01/26/21      PT LONG TERM GOAL #3   Title Pt to demonstrate improved 5xSTS <13second to demonstrate improved hip strength and trunk control    Baseline 16 sec    Time 12    Period Weeks    Status New    Target Date 02/23/21                 Plan - 12/07/20 1158    Clinical Impression Statement Continued working on promoting a more neutral posture to low back as well as improving glute muscle activation and decreasing paraspinal muscle tension to decrease stress to low back.  Back feels better after session reported. Pt will benefit from continued skilled physical therapy services to decrease pain, improve strength and function.    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;Yard Work;Shop    Stability/Clinical Decision Making Evolving/Moderate complexity    Rehab Potential Fair    PT Frequency 2x / week    PT Duration 12 weeks    PT Treatment/Interventions ADLs/Self Care Home Management;Moist Heat;Electrical Stimulation;DME Instruction;Gait  training;Therapeutic exercise;Therapeutic activities;Stair training;Patient/family education    PT Next Visit Plan Needs time to complete FOTO survery before or after session; set up [place] none set up at eval    Consulted and Agree with Plan of Care Patient           Patient will  benefit from skilled therapeutic intervention in order to improve the following deficits and impairments:  Abnormal gait,Difficulty walking,Decreased strength,Postural dysfunction,Improper body mechanics,Impaired flexibility  Visit Diagnosis: Spinal stenosis of lumbar region, unspecified whether neurogenic claudication present  Chronic midline low back pain without sciatica     Problem List Patient Active Problem List   Diagnosis Date Noted  . Statin intolerance 12/06/2018  . Prediabetes 12/05/2018  . Vitamin D deficiency 11/01/2018  . Insomnia 01/16/2017  . Morbid obesity ([place]) 12/13/2016  . Hyperlipidemia   . Hypertension   . Chronic pain syndrome   . Lumbago   . Spinal stenosis    Joneen Boers PT, DPT   12/07/2020, 12:57 PM  [facility] [address] [place], [address] Phone: [phone]   Fax:  [phone]  Name: CARROLYN HILMES MRN: [id number] Date of Birth: September 18, 1951

## 2020-12-09 ENCOUNTER — Ambulatory Visit: Payer: Medicare HMO

## 2020-12-09 ENCOUNTER — Other Ambulatory Visit: Payer: Self-pay

## 2020-12-09 DIAGNOSIS — M545 Low back pain, unspecified: Secondary | ICD-10-CM

## 2020-12-09 DIAGNOSIS — G8929 Other chronic pain: Secondary | ICD-10-CM

## 2020-12-09 DIAGNOSIS — M48061 Spinal stenosis, lumbar region without neurogenic claudication: Secondary | ICD-10-CM

## 2020-12-09 NOTE — Therapy (Signed)
[facility] [address], [place], [address] Phone: [phone]   Fax:  [phone]  Physical Therapy Treatment  Patient Details  Name: Kiara Parker MRN: [id number] Date of Birth: [birth year] Referring Provider (PT): Marnee Guarneri NP (Dr. Jeanie Cooks)    Encounter Date: 12/09/2020    PT End of Session - 12/09/20 1635    Visit Number 4    Number of Visits 17    Date for PT Re-Evaluation 02/22/21    Authorization Type [employer]    Authorization Time Period 11/30/20-02/22/21    Authorization - Visit Number 4    Authorization - Number of Visits 10    PT Start Time 0630    PT Stop Time 1717    PT Time Calculation (min) 42 min    Activity Tolerance Patient tolerated treatment well;Patient limited by pain    Behavior During Therapy [facility] for tasks assessed/performed           Past Medical History:  Diagnosis Date  . Chronic pain syndrome   . Hyperlipidemia   . Hypertension   . Lumbago   . Spinal stenosis     Past Surgical History:  Procedure Laterality Date  . COLONOSCOPY WITH PROPOFOL N/A 11/27/2018   Procedure: COLONOSCOPY WITH PROPOFOL;  Surgeon: Jonathon Bellows, MD;  Location: [facility] ENDOSCOPY;  Service: Gastroenterology;  Laterality: N/A;  . TUBAL LIGATION      There were no vitals filed for this visit.   Subjective Assessment - 12/09/20 1637    Subjective Back is pretty good today so far. Just a twinge of pain currently. Back hurt a little bit after last session but then started painting which made it worse. Painted a book case. Now she just has to get the book case moved over to the [place].    Pertinent History Chronic progressive lumbar spine disease, history of injections spinal injections, Years of back pain, followed by [facility] for 7+ years, but in the past 6 months now tolerates only ~5 minutes of standing, ~5 minutes of walking. Recent radiacular left lateral thigh pain now abated  after epidural injection.    How long can you sit comfortably? unlimited    How long can you stand comfortably? ~5 minutes    How long can you walk comfortably? ~5 minutes    Currently in Pain? Other (Comment)   Just a twinge of pain                                    PT Education - 12/09/20 1642    Education Details ther-ex    Person(s) Educated Patient    Methods Explanation;Demonstration;Tactile cues;Verbal cues    Comprehension Returned demonstration;Verbalized understanding            Objectives  R lumbar paraspinal muscle tension palpated   MedbridgeAccess Code [email or web address]  Therapeutic exercise  Seated manually resisted L lateral shift isometrics to promote more neutral posture, PT manual resistance 10x3 with 5 second holds   Seated B scapular retraction 10x3   Seated manually resisted R upper trunk rotation to promote L lumbar rotation and a more neutral posture 10x3 with 5 second holds   Seated glute max squeeze 10x3, no holds  Seated transversus abdominis contraction 10x3, no holds  Seated thoracic extension over chair to improve mobility and decrease stress to low back 10x3  Seated trunk flexion  To the L. Too much pressure on stomach  Seated manually resisted trunk flexion isometrics with PT in neutral, 10x no holds     Improved exercise technique, movement at target joints, use of target muscles after mod verbal, visual, tactile cues. Mod to max cues to not hold breath with exercises   Manual therapy  Seated STM to R and L lumbar and thoracolumbar paraspinal muscles to decrease tension R > L     Response to treatment No back pain in sitting and gait after session.    Clinical impression Decreased back pain with treatment to decrease thoracolumbar paraspinal muscle tension. Demonstrates difficulty with neuromuscular control with exercises with B upper trap compensation with exercises. Continued working on  promoting a more neutral posture to low back as well as improving glute muscle activation and decreasing paraspinal muscle tension. Pt will benefit from continued skilled physical therapy services to decrease pain, improve strength and function.        PT Short Term Goals - 12/01/20 0813      PT SHORT TERM GOAL #1   Title After 4 weeks pt to reports improved tolerance to walking>10 minutes with use of RW or 4WW to help manage back pain.    Baseline ~5 minutes    Time 4    Period Weeks    Status New    Target Date 12/29/20             PT Long Term Goals - 12/02/20 1603      PT LONG TERM GOAL #1   Title After 8 weeks pt to demonstrate FOTO score improvement >10 points to show improved functional ability.    Baseline to be performed at visit 2; Lumbar FOTO 36 (12/02/2020)    Time 8    Period Weeks    Status New    Target Date 01/26/21      PT LONG TERM GOAL #2   Title After 8 weeks pt to demonstrate 6MWT >861ft c LRAD    Time 8    Period Weeks    Status New    Target Date 01/26/21      PT LONG TERM GOAL #3   Title Pt to demonstrate improved 5xSTS <13second to demonstrate improved hip strength and trunk control    Baseline 16 sec    Time 12    Period Weeks    Status New    Target Date 02/23/21                 Plan - 12/09/20 1635    Clinical Impression Statement Decreased back pain with treatment to decrease thoracolumbar paraspinal muscle tension. Demonstrates difficulty with neuromuscular control with exercises with B upper trap compensation with exercises. Continued working on promoting a more neutral posture to low back as well as improving glute muscle activation and decreasing paraspinal muscle tension. Pt will benefit from continued skilled physical therapy services to decrease pain, improve strength and function.    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;Yard Work;Shop    Stability/Clinical Decision Making --    Rehab Potential Fair    PT Frequency 2x / week    PT Duration 12 weeks    PT Treatment/Interventions ADLs/Self Care Home Management;Moist Heat;Electrical Stimulation;DME Instruction;Gait training;Therapeutic exercise;Therapeutic activities;Stair training;Patient/family education    PT Next Visit Plan Needs time to complete FOTO survery before or after session; set up HEP  PT Home Exercise Plan none set up at eval    Consulted and Agree with Plan of Care Patient           Patient will benefit from skilled therapeutic intervention in order to improve the following deficits and impairments:  Abnormal gait,Difficulty walking,Decreased strength,Postural dysfunction,Improper body mechanics,Impaired flexibility  Visit Diagnosis: Spinal stenosis of lumbar region, unspecified whether neurogenic claudication present  Chronic midline low back pain without sciatica     Problem List Patient Active Problem List   Diagnosis Date Noted  . Statin intolerance 12/06/2018  . Prediabetes 12/05/2018  . Vitamin D deficiency 11/01/2018  . Insomnia 01/16/2017  . Morbid obesity ([place]) 12/13/2016  . Hyperlipidemia   . Hypertension   . Chronic pain syndrome   . Lumbago   . Spinal stenosis     Joneen Boers PT, DPT  12/09/2020, 6:26 PM  [facility] [address], [place], [address] Phone: [phone]   Fax:  [phone]  Name: Kiara Parker MRN: [id number] Date of Birth: 12-27-1950

## 2020-12-13 ENCOUNTER — Ambulatory Visit
Admission: RE | Admit: 2020-12-13 | Discharge: 2020-12-13 | Disposition: A | Discharge status: Home / Self Care | Payer: Medicare HMO | Attending: Internal Medicine | Admitting: Internal Medicine

## 2020-12-13 ENCOUNTER — Ambulatory Visit
Admission: RE | Admit: 2020-12-13 | Discharge: 2020-12-13 | Disposition: A | Discharge status: Home / Self Care | Payer: Medicare HMO | Source: Ambulatory Visit | Attending: Internal Medicine | Admitting: Internal Medicine

## 2020-12-13 ENCOUNTER — Encounter: Payer: Self-pay | Admitting: Internal Medicine

## 2020-12-13 ENCOUNTER — Telehealth (INDEPENDENT_AMBULATORY_CARE_PROVIDER_SITE_OTHER): Payer: Medicare HMO | Admitting: Internal Medicine

## 2020-12-13 ENCOUNTER — Other Ambulatory Visit: Payer: Self-pay

## 2020-12-13 VITALS — Temp 104.7°F

## 2020-12-13 DIAGNOSIS — R509 Fever, unspecified: Secondary | ICD-10-CM

## 2020-12-13 DIAGNOSIS — R059 Cough, unspecified: Secondary | ICD-10-CM | POA: Diagnosis not present

## 2020-12-13 MED ORDER — DOXYCYCLINE HYCLATE 100 MG PO TABS: 100.0000 mg | ORAL_TABLET | Freq: Two times a day (BID) | ORAL | 0 refills | Status: DC

## 2020-12-13 NOTE — Addendum Note (Signed)
Addended by: Georgina Peer on: 12/13/2020 04:09 PM   Modules accepted: Orders

## 2020-12-13 NOTE — Addendum Note (Signed)
Addended byCharlynne Cousins on: 12/13/2020 02:27 PM   Modules accepted: Orders

## 2020-12-13 NOTE — Progress Notes (Signed)
Temp (!) 104.7 F (40.4 C)   LMP  (LMP Unknown)    Subjective:    Patient ID: Kiara Parker, female    DOB: 11/23/1950, 70 y.o.   MRN: [id number]  HPI: Kiara Parker is a 70 y.o. female  Head cold , congestion, fever 100.4 F n- checked 30 mins started Friday been taking nyquil , dayqil and advil -- has had both covid shots and boosters.  Cough This is a new problem. Associated symptoms include chills, a fever, myalgias, nasal congestion, a sore throat and sweats. Pertinent negatives include no chest pain, ear congestion, ear pain, heartburn, hemoptysis, postnasal drip, rash, rhinorrhea, shortness of breath, weight loss or wheezing. The treatment provided no relief.  Fever  Associated symptoms include coughing and a sore throat. Pertinent negatives include no chest pain, ear pain, rash or wheezing.    . This visit was completed via telephone due to the restrictions of the COVID-19 pandemic. All issues as above were discussed and addressed but no physical exam was performed. If it was felt that the patient should be evaluated in the office, they were directed there. The patient verbally consented to this visit. Patient was unable to complete an audio/visual visit due to {Blank single:19197::"Technical difficulties, Lack of internet . Location of the patient: home . Location of the provider: work . Those involved with this call:  . Provider: Charlynne Cousins, MD . CMA: Frazier Butt, CMA  . Time spent on call: 10 minutes on the phone discussing health concerns. 8 minutes total spent in review of patient's record and preparation of their chart.      To schedule a COVID test, please log on to [email or web address] to easily make an on-line appointment. If you do not have access to a smart phone or PC, you can call [phone] (Mon. - Fri. 7 a.m. to 7 p.m) to schedule  Chief Complaint  Patient presents with  . Fatigue  . Cough  . Fever  . Nasal Congestion  . Generalized  Body Aches    Relevant past medical, surgical, family and social history reviewed and updated as indicated. Interim medical history since our last visit reviewed. Allergies and medications reviewed and updated.  Review of Systems  Constitutional: Positive for chills and fever. Negative for weight loss.  HENT: Positive for sore throat. Negative for ear pain, postnasal drip and rhinorrhea.   Respiratory: Positive for cough. Negative for hemoptysis, shortness of breath and wheezing.   Cardiovascular: Negative for chest pain.  Gastrointestinal: Negative for heartburn.  Musculoskeletal: Positive for myalgias.  Skin: Negative for rash.     Per HPI unless specifically indicated above     Objective:    Temp (!) 104.7 F (40.4 C)   LMP  (LMP Unknown)   Wt Readings from Last 3 Encounters:  10/29/20 225 lb 9.6 oz (102.3 kg)  08/24/20 228 lb (103.4 kg)  08/06/20 220 lb (99.8 kg)    Physical Exam Vitals reviewed: coulndt perform sec to virtual visit.     Results for orders placed or performed in visit on 10/29/20  HgB A1c  Result Value Ref Range   Hgb A1c MFr Bld 6.3 (H) 4.8 - 5.6 %   Est. average glucose Bld gHb Est-mCnc 134 mg/dL  Basic metabolic panel  Result Value Ref Range   Glucose 115 (H) 65 - 99 mg/dL   BUN 12 8 - 27 mg/dL   Creatinine, Ser 0.95 0.57 - 1.00 mg/dL   GFR calc non Af  Amer 62 >59 mL/min/1.73   GFR calc Af Amer 71 >59 mL/min/1.73   BUN/Creatinine Ratio 13 12 - 28   Sodium 140 134 - 144 mmol/L   Potassium 3.9 3.5 - 5.2 mmol/L   Chloride 99 96 - 106 mmol/L   CO2 29 20 - 29 mmol/L   Calcium 9.7 8.7 - 10.3 mg/dL  Microalbumin, Urine Waived  Result Value Ref Range   Microalb, Ur Waived 10 0 - 19 mg/L   Creatinine, Urine Waived 200 10 - 300 mg/dL   Microalb/Creat Ratio <30 <30 mg/g      Assessment & Plan:  1. URTI COVID : check for such Increase fluid intake.  Headahce - tyelnol every 4-6 hrs prn and alternate this with ibubrufen 800 mg q 8 hrly. OTC -   Allegra / claritin.  5 days quarantine.  Ok to rtw in 5 days if tests -ve follow     Problem List Items Addressed This Visit   None

## 2020-12-14 ENCOUNTER — Ambulatory Visit: Payer: Medicare HMO

## 2020-12-14 LAB — VERITOR FLU A/B WAIVED
Influenza A: NEGATIVE
Influenza B: NEGATIVE

## 2020-12-14 LAB — NOVEL CORONAVIRUS, NAA: SARS-CoV-2, NAA: NOT DETECTED

## 2020-12-14 LAB — SARS-COV-2, NAA 2 DAY TAT

## 2020-12-14 NOTE — Addendum Note (Signed)
Addended byCharlynne Cousins on: 12/14/2020 11:50 AM   Modules accepted: Orders

## 2020-12-14 NOTE — Progress Notes (Signed)
Please let pt know this was normal.

## 2020-12-16 ENCOUNTER — Ambulatory Visit: Payer: Medicare HMO

## 2020-12-18 LAB — SPECIMEN STATUS REPORT

## 2020-12-18 LAB — CULTURE, GROUP A STREP: Strep A Culture: NEGATIVE

## 2020-12-21 ENCOUNTER — Ambulatory Visit: Payer: Medicare HMO

## 2020-12-21 ENCOUNTER — Other Ambulatory Visit: Payer: Self-pay

## 2020-12-21 DIAGNOSIS — M48061 Spinal stenosis, lumbar region without neurogenic claudication: Secondary | ICD-10-CM | POA: Diagnosis not present

## 2020-12-21 DIAGNOSIS — M545 Low back pain, unspecified: Secondary | ICD-10-CM

## 2020-12-21 DIAGNOSIS — G8929 Other chronic pain: Secondary | ICD-10-CM | POA: Diagnosis not present

## 2020-12-21 NOTE — Therapy (Signed)
[facility] [address], [place], [address] Phone: [phone]   Fax:  [phone]  Physical Therapy Treatment  Patient Details  Name: Kiara Parker MRN: [id number] Date of Birth: 1951/08/02 Referring Provider (PT): Marnee Guarneri NP (Dr. Jeanie Cooks)   Encounter Date: 12/21/2020   PT End of Session - 12/21/20 1150    Visit Number 5    Number of Visits 17    Date for PT Re-Evaluation 02/22/21    Authorization Type [employer]    Authorization Time Period 11/30/20-02/22/21    Authorization - Visit Number 5    Authorization - Number of Visits 10    PT Start Time 1151    PT Stop Time 1216    PT Time Calculation (min) 25 min    Activity Tolerance Patient tolerated treatment well;Patient limited by pain    Behavior During Therapy [facility] for tasks assessed/performed           Past Medical History:  Diagnosis Date  . Chronic pain syndrome   . Hyperlipidemia   . Hypertension   . Lumbago   . Spinal stenosis     Past Surgical History:  Procedure Laterality Date  . COLONOSCOPY WITH PROPOFOL N/A 11/27/2018   Procedure: COLONOSCOPY WITH PROPOFOL;  Surgeon: Jonathon Bellows, MD;  Location: [facility] ENDOSCOPY;  Service: Gastroenterology;  Laterality: N/A;  . TUBAL LIGATION      There were no vitals filed for this visit.   Subjective Assessment - 12/21/20 1152    Subjective Back is ok right now. Just a little bit of a pinch on the R side when walking    Pertinent History Chronic progressive lumbar spine disease, history of injections spinal injections, Years of back pain, followed by [facility] for 7+ years, but in the past 6 months now tolerates only ~5 minutes of standing, ~5 minutes of walking. Recent radiacular left lateral thigh pain now abated after epidural injection.    How long can you sit comfortably? unlimited    How long can you stand comfortably? ~5 minutes    How long can you walk comfortably?  ~5 minutes    Currently in Pain? Other (Comment)   Just a little pinch, no pain level provided.                                    PT Education - 12/21/20 1155    Education Details ther-ex    Person(s) Educated Patient    Methods Explanation;Demonstration;Tactile cues;Verbal cues    Comprehension Returned demonstration;Verbalized understanding            Objectives  R lumbar paraspinal muscle tension palpated   MedbridgeAccess Code [email or web address]  Therapeutic exercise  Seated manually resisted L lateral shift isometrics to promote more neutral posture, PT manual resistance 10x3   Seated manually resisted L upper trunk rotation to promote R lumbar rotation and a more neutral posture 10x3   Seated manually resisted trunk flexion isometrics with PT in neutral, 10x3   Seated thoracic extension over chair to improve mobility and decrease stress to low back 10x3   Seated hip extension isometrics alternating  R 10x3  L 10x3   Seated transversus abdominis contraction 10x3, no holds     Improved exercise technique, movement at target joints, use of target muscles after mod verbal, visual, tactile cues. Mod to max cues to not hold breath  with exercises     Response to treatment No back pain in sittingand gait after session.    Clinical impression No hold performed for exercises secondary to pt difficulty not holding her breath. Light session performed secondary to pt recovering from sickness, as to not overwork her body. Continued working on decreasing lateral shift posture, improving thoracic extension, and hip strength to decrease stress to low back when ambulating and performing standing tasks. Pt tolerated session well without aggravation of symptoms. Pt will benefit from continued skilled physical therapy services to decrease pain, improve strength and function.            PT Short Term Goals - 12/01/20 0813      PT SHORT  TERM GOAL #1   Title After 4 weeks pt to reports improved tolerance to walking>10 minutes with use of RW or 4WW to help manage back pain.    Baseline ~5 minutes    Time 4    Period Weeks    Status New    Target Date 12/29/20             PT Long Term Goals - 12/02/20 1603      PT LONG TERM GOAL #1   Title After 8 weeks pt to demonstrate FOTO score improvement >10 points to show improved functional ability.    Baseline to be performed at visit 2; Lumbar FOTO 36 (12/02/2020)    Time 8    Period Weeks    Status New    Target Date 01/26/21      PT LONG TERM GOAL #2   Title After 8 weeks pt to demonstrate 6MWT >842ft c LRAD    Time 8    Period Weeks    Status New    Target Date 01/26/21      PT LONG TERM GOAL #3   Title Pt to demonstrate improved 5xSTS <13second to demonstrate improved hip strength and trunk control    Baseline 16 sec    Time 12    Period Weeks    Status New    Target Date 02/23/21                 Plan - 12/21/20 1155    Clinical Impression Statement No hold performed for exercises secondary to pt difficulty not holding her breath. Light session performed secondary to pt recovering from sickness, as to not overwork her body. Continued working on decreasing lateral shift posture, improving thoracic extension, and hip strength to decrease stress to low back when ambulating and performing standing tasks. Pt tolerated session well without aggravation of symptoms. Pt will benefit from continued skilled physical therapy services to decrease pain, improve strength and function.    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;Yard Work;Shop    Rehab Potential Fair    PT Frequency 2x / week    PT Duration 12 weeks    PT Treatment/Interventions ADLs/Self Care Home  Management;Moist Heat;Electrical Stimulation;DME Instruction;Gait training;Therapeutic exercise;Therapeutic activities;Stair training;Patient/family education    PT Next Visit Plan Needs time to complete FOTO survery before or after session; set up [place] none set up at eval    Consulted and Agree with Plan of Care Patient           Patient will benefit from skilled therapeutic intervention in order to improve the following deficits and impairments:  Abnormal gait,Difficulty  walking,Decreased strength,Postural dysfunction,Improper body mechanics,Impaired flexibility  Visit Diagnosis: Spinal stenosis of lumbar region, unspecified whether neurogenic claudication present  Chronic midline low back pain without sciatica     Problem List Patient Active Problem List   Diagnosis Date Noted  . Body mass index (BMI) 38.0-38.9, adult 09/13/2020  . Elevated blood-pressure reading, without diagnosis of hypertension 09/13/2020  . Lumbar radiculitis 09/13/2020  . Statin intolerance 12/06/2018  . Prediabetes 12/05/2018  . Vitamin D deficiency 11/01/2018  . Insomnia 01/16/2017  . Morbid obesity ([place]) 12/13/2016  . Hyperlipidemia   . Hypertension   . Chronic pain syndrome   . Lumbago   . Spinal stenosis   . Displacement of lumbar intervertebral disc without myelopathy 03/11/2013   Joneen Boers PT, DPT   12/21/2020, 12:17 PM  [facility] [address] [place], [address] Phone: [phone]   Fax:  [phone]  Name: TEKESHA ALMGREN MRN: [id number] Date of Birth: [birth year]

## 2020-12-23 ENCOUNTER — Other Ambulatory Visit: Payer: Self-pay | Admitting: Nurse Practitioner

## 2020-12-23 NOTE — Telephone Encounter (Signed)
Requested medications are due for refill today.  yes  Requested medications are on the active medications list.  yes  Last refill. 03/15/2020  Future visit scheduled.   yes  Notes to clinic.  Medication not delegated.

## 2020-12-23 NOTE — Telephone Encounter (Signed)
Routing to provider  

## 2020-12-24 ENCOUNTER — Ambulatory Visit: Payer: Medicare HMO | Admitting: Nurse Practitioner

## 2020-12-27 ENCOUNTER — Ambulatory Visit: Payer: Medicare HMO | Admitting: Nurse Practitioner

## 2020-12-27 ENCOUNTER — Other Ambulatory Visit: Payer: Self-pay

## 2020-12-27 ENCOUNTER — Ambulatory Visit: Payer: Medicare HMO | Attending: Nurse Practitioner

## 2020-12-27 DIAGNOSIS — M48061 Spinal stenosis, lumbar region without neurogenic claudication: Secondary | ICD-10-CM | POA: Diagnosis not present

## 2020-12-27 DIAGNOSIS — G8929 Other chronic pain: Secondary | ICD-10-CM | POA: Insufficient documentation

## 2020-12-27 DIAGNOSIS — M545 Low back pain, unspecified: Secondary | ICD-10-CM | POA: Diagnosis not present

## 2020-12-27 NOTE — Patient Instructions (Addendum)
  Access Code: [email or web address] URL: [email or web address] Date: 12/27/2020 Prepared by: Joneen Boers  Exercises Seated Transversus Abdominis Bracing - 1 x daily - 7 x weekly - 3 sets - 10 reps - 5 seconds hold Seated Gluteal Sets - 3 x daily - 7 x weekly - 3 sets - 10 reps Seated Thoracic Lumbar Extension - 1 x daily - 7 x weekly - 3 sets - 10 reps Supine Posterior Pelvic Tilt - 1 x daily - 7 x weekly - 3 sets - 10 reps

## 2020-12-27 NOTE — Therapy (Signed)
[facility] [address] [place], [address] Phone: [phone]   Fax:  [phone]  Physical Therapy Treatment  Patient Details  Name: Kiara Parker MRN: [id number] Date of Birth: [birth year] Referring Provider (PT): Marnee Guarneri NP (Dr. Jeanie Cooks)   Encounter Date: 12/27/2020   PT End of Session - 12/27/20 0848    Visit Number 6    Number of Visits 17    Date for PT Re-Evaluation 02/22/21    Authorization Type [employer]    Authorization Time Period 11/30/20-02/22/21    Authorization - Visit Number 6    Authorization - Number of Visits 10    PT Start Time 0848    PT Stop Time 0927    PT Time Calculation (min) 39 min    Activity Tolerance Patient tolerated treatment well;Patient limited by pain    Behavior During Therapy [facility] for tasks assessed/performed           Past Medical History:  Diagnosis Date  . Chronic pain syndrome   . Hyperlipidemia   . Hypertension   . Lumbago   . Spinal stenosis     Past Surgical History:  Procedure Laterality Date  . COLONOSCOPY WITH PROPOFOL N/A 11/27/2018   Procedure: COLONOSCOPY WITH PROPOFOL;  Surgeon: Jonathon Bellows, MD;  Location: [facility] ENDOSCOPY;  Service: Gastroenterology;  Laterality: N/A;  . TUBAL LIGATION      There were no vitals filed for this visit.   Subjective Assessment - 12/27/20 0849    Subjective Back is ok today.  Slight pinch in low back when walking. 3/10 low back pain at most for the past 7 days.    Pertinent History Chronic progressive lumbar spine disease, history of injections spinal injections, Years of back pain, followed by [facility] for 7+ years, but in the past 6 months now tolerates only ~5 minutes of standing, ~5 minutes of walking. Recent radiacular left lateral thigh pain now abated after epidural injection.    How long can you sit comfortably? unlimited    How long can you stand comfortably? ~5 minutes    How long  can you walk comfortably? ~5 minutes    Currently in Pain? Yes   slight pinch in low back, no number provided.                                    PT Education - 12/27/20 0900    Education Details ther-ex, HEP    Person(s) Educated Patient    Methods Explanation;Demonstration;Tactile cues;Verbal cues;Handout    Comprehension Returned demonstration;Verbalized understanding            Objectives  R lumbar paraspinal muscle tension palpated   MedbridgeAccess Code [email or web address]  Therapeutic exercise  Seated manually resisted L lateral shift isometrics to promote more neutral posture, PT manual resistance 10x3   Seated manually resistedLupper trunk rotation to promote R lumbar rotation and a more neutral posture 10x3   Seated thoracic extension over chair to improve mobility and decrease stress to low back 10x3  Seated hip extension isometrics alternating             R 10x3             L 10x3  Seated manually resisted trunk flexion isometrics with PT in neutral, 10x3   Seated B scapular retraction to promote thoracic extension 10x3  hooklying posterior pelvic  tilt 10x3  hoooklying clamshell blue band 10x4  Mini bridge 10x2    Improved exercise technique, movement at target joints, use of target muscles after mod verbal, visual, tactile cues.  Mod to max cues to not hold breath with exercises     Response to treatment Pt tolerated session well without aggravation of symptoms.   Clinical impression No hold performed for exercises secondary to pt difficulty not holding her breath. Pt seems to be improving with overall low back pain with worst pain reported for the past 7 days as 3/10. Continued working on improving posture, thoracic extension, trunk and glute strength to decrease stress to low back. Pt tolerated session well without aggravation of symptoms. Pt will benefit from continued skilled physical therapy services to  improve posture, decrease pain, improve strength and function.         PT Short Term Goals - 12/01/20 0813      PT SHORT TERM GOAL #1   Title After 4 weeks pt to reports improved tolerance to walking>10 minutes with use of RW or 4WW to help manage back pain.    Baseline ~5 minutes    Time 4    Period Weeks    Status New    Target Date 12/29/20             PT Long Term Goals - 12/02/20 1603      PT LONG TERM GOAL #1   Title After 8 weeks pt to demonstrate FOTO score improvement >10 points to show improved functional ability.    Baseline to be performed at visit 2; Lumbar FOTO 36 (12/02/2020)    Time 8    Period Weeks    Status New    Target Date 01/26/21      PT LONG TERM GOAL #2   Title After 8 weeks pt to demonstrate 6MWT >875ft c LRAD    Time 8    Period Weeks    Status New    Target Date 01/26/21      PT LONG TERM GOAL #3   Title Pt to demonstrate improved 5xSTS <13second to demonstrate improved hip strength and trunk control    Baseline 16 sec    Time 12    Period Weeks    Status New    Target Date 02/23/21                 Plan - 12/27/20 0900    Clinical Impression Statement No hold performed for exercises secondary to pt difficulty not holding her breath. Pt seems to be improving with overall low back pain with worst pain reported for the past 7 days as 3/10. Continued working on improving posture, thoracic extension, trunk and glute strength to decrease stress to low back. Pt tolerated session well without aggravation of symptoms. Pt will benefit from continued skilled physical therapy services to improve posture, decrease pain, improve strength and function.    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;Yard Work;Shop    Stability/Clinical Decision Making  Stable/Uncomplicated    Clinical Decision Making Low    Rehab Potential Fair    PT Frequency 2x / week    PT Duration 12 weeks    PT Treatment/Interventions ADLs/Self Care Home Management;Moist Heat;Electrical Stimulation;DME Instruction;Gait training;Therapeutic exercise;Therapeutic activities;Stair training;Patient/family education    PT Next Visit Plan Needs time to complete FOTO survery before or after session; set up HEP  PT Home Exercise Plan none set up at eval    Consulted and Agree with Plan of Care Patient           Patient will benefit from skilled therapeutic intervention in order to improve the following deficits and impairments:  Abnormal gait,Difficulty walking,Decreased strength,Postural dysfunction,Improper body mechanics,Impaired flexibility  Visit Diagnosis: Spinal stenosis of lumbar region, unspecified whether neurogenic claudication present  Chronic midline low back pain without sciatica     Problem List Patient Active Problem List   Diagnosis Date Noted  . Body mass index (BMI) 38.0-38.9, adult 09/13/2020  . Elevated blood-pressure reading, without diagnosis of hypertension 09/13/2020  . Lumbar radiculitis 09/13/2020  . Statin intolerance 12/06/2018  . Prediabetes 12/05/2018  . Vitamin D deficiency 11/01/2018  . Insomnia 01/16/2017  . Morbid obesity ([place]) 12/13/2016  . Hyperlipidemia   . Hypertension   . Chronic pain syndrome   . Lumbago   . Spinal stenosis   . Displacement of lumbar intervertebral disc without myelopathy 03/11/2013    Joneen Boers PT, DPT   12/27/2020, 1:49 PM  [facility] [address], [place], [address] Phone: [phone]   Fax:  [phone]  Name: Kiara Parker MRN: [id number] Date of Birth: [birth year]

## 2020-12-28 ENCOUNTER — Ambulatory Visit (INDEPENDENT_AMBULATORY_CARE_PROVIDER_SITE_OTHER): Payer: Medicare HMO | Admitting: Nurse Practitioner

## 2020-12-28 ENCOUNTER — Encounter: Payer: Self-pay | Admitting: Nurse Practitioner

## 2020-12-28 DIAGNOSIS — I10 Essential (primary) hypertension: Secondary | ICD-10-CM

## 2020-12-28 DIAGNOSIS — R7303 Prediabetes: Secondary | ICD-10-CM

## 2020-12-28 DIAGNOSIS — E782 Mixed hyperlipidemia: Secondary | ICD-10-CM

## 2020-12-28 DIAGNOSIS — Z0000 Encounter for general adult medical examination without abnormal findings: Secondary | ICD-10-CM

## 2020-12-28 DIAGNOSIS — E559 Vitamin D deficiency, unspecified: Secondary | ICD-10-CM

## 2020-12-28 DIAGNOSIS — Z1159 Encounter for screening for other viral diseases: Secondary | ICD-10-CM | POA: Diagnosis not present

## 2020-12-28 DIAGNOSIS — B3731 Acute candidiasis of vulva and vagina: Secondary | ICD-10-CM

## 2020-12-28 DIAGNOSIS — B373 Candidiasis of vulva and vagina: Secondary | ICD-10-CM

## 2020-12-28 DIAGNOSIS — Z6839 Body mass index (BMI) 39.0-39.9, adult: Secondary | ICD-10-CM

## 2020-12-28 DIAGNOSIS — M48061 Spinal stenosis, lumbar region without neurogenic claudication: Secondary | ICD-10-CM

## 2020-12-28 DIAGNOSIS — Z1231 Encounter for screening mammogram for malignant neoplasm of breast: Secondary | ICD-10-CM

## 2020-12-28 DIAGNOSIS — M791 Myalgia, unspecified site: Secondary | ICD-10-CM | POA: Diagnosis not present

## 2020-12-28 DIAGNOSIS — T466X5A Adverse effect of antihyperlipidemic and antiarteriosclerotic drugs, initial encounter: Secondary | ICD-10-CM

## 2020-12-28 LAB — MICROALBUMIN, URINE WAIVED
Creatinine, Urine Waived: 100 mg/dL (ref 10–300)
Microalb, Ur Waived: 10 mg/L (ref 0–19)
Microalb/Creat Ratio: <30 mg/g (ref <30)

## 2020-12-28 MED ORDER — ROSUVASTATIN CALCIUM 5 MG PO TABS: ORAL_TABLET | ORAL | 4 refills | Status: DC

## 2020-12-28 MED ORDER — GABAPENTIN 300 MG PO CAPS: ORAL_CAPSULE | ORAL | 4 refills | Status: DC

## 2020-12-28 MED ORDER — HYDROCHLOROTHIAZIDE 12.5 MG PO TABS: 12.5000 mg | ORAL_TABLET | Freq: Every day | ORAL | 4 refills | Status: DC

## 2020-12-28 MED ORDER — BACLOFEN 10 MG PO TABS: 5.0000 mg | ORAL_TABLET | Freq: Every day | ORAL | 4 refills | Status: DC

## 2020-12-28 MED ORDER — SHINGRIX 50 MCG/0.5ML IM SUSR: 0.5000 mL | Freq: Once | INTRAMUSCULAR | 0 refills | Status: AC

## 2020-12-28 MED ORDER — MELOXICAM 15 MG PO TABS: 1.0000 | ORAL_TABLET | Freq: Every day | ORAL | 3 refills | Status: DC

## 2020-12-28 MED ORDER — FLUCONAZOLE 150 MG PO TABS: 150.0000 mg | ORAL_TABLET | Freq: Once | ORAL | 0 refills | Status: AC

## 2020-12-28 NOTE — Assessment & Plan Note (Signed)
Ongoing with A1C last 6.3%, she continues to be between 5.6 to 6.3% on review.  Will adjust plan of care and add medication as needed.  Continue diet focus at this time.  A1C and CMP today + urine ALB.

## 2020-12-28 NOTE — Assessment & Plan Note (Signed)
At this time is tolerating Crestor on 3 day a week schedule, adjust this as needed.   If symptoms present then consider weekly dosing or change to Repatha.  Lipid panel today. 

## 2020-12-28 NOTE — Progress Notes (Signed)
BP 122/79   Pulse 61   Temp 98 F (36.7 C) (Oral)   Ht 5\' 3"  (1.6 m)   Wt 222 lb (100.7 kg)   LMP  (LMP Unknown)   SpO2 95%   BMI 39.33 kg/m    Subjective:    Patient ID: Kiara Parker, female    DOB: 1950-11-03, 70 y.o.   MRN: [id number]  HPI: Kiara Parker is a 70 y.o. female presenting on 12/28/2020 for comprehensive medical examination. Current medical complaints include:none  She currently lives with: significant other, 57 years of marriage Menopausal Symptoms: no   Has recent viral infection and was given abx, reports yeast infection present now.  HYPERLIPIDEMIA Continues on Crestor 5 MG three days a week.  She states the Crestor on three day schedule is working better and less side effects.  Continues on HCTZ 12.5 MG  Hyperlipidemia status: good compliance Satisfied with current treatment?  no Side effects:  no Medication compliance: good compliance Past cholesterol meds: Lipitor and Pravastatin Supplements: none Aspirin:  yes The 10-year ASCVD risk score Mikey Bussing DC Jr., et al., 2013) is: 17.5%   Values used to calculate the score:     Age: 65 years     Sex: Female     Is Non-Hispanic African American: No     Diabetic: Yes     Tobacco smoker: No     Systolic Blood Pressure: 834 mmHg     Is BP treated: Yes     HDL Cholesterol: 43 mg/dL     Total Cholesterol: 175 mg/dL Chest pain:  no Coronary artery disease:  no Family history CAD:  no Family history early CAD:  no   PREDIABETES: A1C in February was 6.3%.  Diet focused at this time, although she reports not following diet well.  Her mother passed last year.  She has history of low Vitamin D on labs in October 35.2.  She was treated with high dose Vit D, but now taking 1000 units daily.  No recent falls or fractures. Polydipsia/polyuria: no Visual disturbance: no Chest pain: no Paresthesias: no  BACK PAIN Did see neurosurgery at [facility] on December 7th, placed injection at that time.   Returned to see neurosurgery on 11/05/20 for second injection to different area (epidural) -- this is working better.  Is taking Gabapentin 3 times a day + Baclofen at bedtime + Meloxicam daily.  Currently participating in PT, last yesterday.    Had MRI lumbar spine in 2014 noting multilevel disc "degeneration with particular prominent degeneration and facet hypertrophy at L4-L5 resulting in prominent spinal stenosis at this level".  Recent bone density on 10/09/19 was normal. Duration: years Mechanism of injury: no trauma Location: L>R, midline and low back + left hip Onset: gradual Severity: improved with epidural recently Aggravating factors: movement, walking and bending -- walking especially Alleviating factors: epidural Status: better Treatments attempted: rest, heat and aleve and medications noted above -- epidural  Relief with NSAIDs?: moderate Nighttime pain: none Paresthesias / decreased sensation:  no Bowel / bladder incontinence:  no Fevers:  no Dysuria / urinary frequency:  no  Depression Screen done today and results listed below:  Depression screen [facility] 2/9 12/28/2020 12/13/2020 10/29/2020 08/06/2020 07/21/2019  Decreased Interest 0 0 0 0 0  Down, Depressed, Hopeless 0 0 0 0 0  PHQ - 2 Score 0 0 0 0 0  Altered sleeping 0 - - - -  Tired, decreased energy 1 - - - -  Change in appetite 0 - - - -  Feeling bad or failure about yourself  0 - - - -  Trouble concentrating 0 - - - -  Moving slowly or fidgety/restless 0 - - - -  Suicidal thoughts 0 - - - -  PHQ-9 Score 1 - - - -  Difficult doing work/chores - - - - -    The patient does not have a history of falls. I did not complete a risk assessment for falls. A plan of care for falls was not documented.   Past Medical History:  Past Medical History:  Diagnosis Date  . Chronic pain syndrome   . Hyperlipidemia   . Hypertension   . Lumbago   . Spinal stenosis     Surgical History:  Past Surgical History:  Procedure  Laterality Date  . COLONOSCOPY WITH PROPOFOL N/A 11/27/2018   Procedure: COLONOSCOPY WITH PROPOFOL;  Surgeon: Jonathon Bellows, MD;  Location: [facility];  Service: Gastroenterology;  Laterality: N/A;  . TUBAL LIGATION      Medications:  Current Outpatient Medications on File Prior to Visit  Medication Sig  . ASPIRIN 81 PO Take 1 tablet by mouth daily.   No current facility-administered medications on file prior to visit.    Allergies:  Allergies  Allergen Reactions  . Atorvastatin Other (See Comments)  . Clavulanic Acid   . Erythromycin   . Penicillins Hives  . Pravastatin Other (See Comments)  . Zithromax [Azithromycin] Hives    Social History:  Social History   Socioeconomic History  . Marital status: Married    Spouse name: Not on file  . Number of children: Not on file  . Years of education: Not on file  . Highest education level: Not on file  Occupational History  . Occupation: retired  Tobacco Use  . Smoking status: Never Smoker  . Smokeless tobacco: Never Used  Vaping Use  . Vaping Use: Never used  Substance and Sexual Activity  . Alcohol use: No  . Drug use: No  . Sexual activity: Yes    Birth control/protection: None  Other Topics Concern  . Not on file  Social History Narrative  . Not on file   Social Determinants of Health   Financial Resource Strain: Low Risk   . Difficulty of Paying Living Expenses: Not hard at all  Food Insecurity: No Food Insecurity  . Worried About Charity fundraiser in the Last Year: Never true  . Ran Out of Food in the Last Year: Never true  Transportation Needs: No Transportation Needs  . Lack of Transportation (Medical): No  . Lack of Transportation (Non-Medical): No  Physical Activity: Inactive  . Days of Exercise per Week: 0 days  . Minutes of Exercise per Session: 0 min  Stress: No Stress Concern Present  . Feeling of Stress : Not at all  Social Connections: Not on file  Intimate Partner Violence: Not on file    Social History   Tobacco Use  Smoking Status Never Smoker  Smokeless Tobacco Never Used   Social History   Substance and Sexual Activity  Alcohol Use No    Family History:  Family History  Problem Relation Age of Onset  . Cancer Mother        breast  . Arthritis Mother   . Osteoporosis Mother   . Breast cancer Mother   . Heart disease Father   . Lung disease Father   . Cancer Sister  lymphoma  . Cancer Maternal Grandmother        lymphoma  . Multiple sclerosis Sister     Past medical history, surgical history, medications, allergies, family history and social history reviewed with patient today and changes made to appropriate areas of the chart.   ROS All other ROS negative except what is listed above and in the HPI.      Objective:    BP 122/79   Pulse 61   Temp 98 F (36.7 C) (Oral)   Ht 5\' 3"  (1.6 m)   Wt 222 lb (100.7 kg)   LMP  (LMP Unknown)   SpO2 95%   BMI 39.33 kg/m   Wt Readings from Last 3 Encounters:  12/28/20 222 lb (100.7 kg)  10/29/20 225 lb 9.6 oz (102.3 kg)  08/24/20 228 lb (103.4 kg)    Physical Exam Vitals and nursing note reviewed.  Constitutional:      General: She is awake. She is not in acute distress.    Appearance: She is well-developed and well-groomed. She is obese. She is not ill-appearing.  HENT:     Head: Normocephalic and atraumatic.     Right Ear: Hearing, tympanic membrane, ear canal and external ear normal. No drainage.     Left Ear: Hearing, tympanic membrane, ear canal and external ear normal. No drainage.     Nose: Nose normal.     Right Sinus: No maxillary sinus tenderness or frontal sinus tenderness.     Left Sinus: No maxillary sinus tenderness or frontal sinus tenderness.     Mouth/Throat:     Mouth: Mucous membranes are moist.     Pharynx: Oropharynx is clear. Uvula midline. No pharyngeal swelling, oropharyngeal exudate or posterior oropharyngeal erythema.  Eyes:     General: Lids are normal.         Right eye: No discharge.        Left eye: No discharge.     Extraocular Movements: Extraocular movements intact.     Conjunctiva/sclera: Conjunctivae normal.     Pupils: Pupils are equal, round, and reactive to light.     Visual Fields: Right eye visual fields normal and left eye visual fields normal.  Neck:     Thyroid: No thyromegaly.     Vascular: No carotid bruit.     Trachea: Trachea normal.  Cardiovascular:     Rate and Rhythm: Normal rate and regular rhythm.     Heart sounds: Normal heart sounds. No murmur heard. No gallop.   Pulmonary:     Effort: Pulmonary effort is normal. No accessory muscle usage or respiratory distress.     Breath sounds: Normal breath sounds.  Chest:     Comments: Deferred per patient request. Abdominal:     General: Bowel sounds are normal.     Palpations: Abdomen is soft. There is no hepatomegaly or splenomegaly.     Tenderness: There is no abdominal tenderness.  Musculoskeletal:        General: Normal range of motion.     Cervical back: Normal range of motion and neck supple.     Right lower leg: No edema.     Left lower leg: No edema.  Lymphadenopathy:     Head:     Right side of head: No submental, submandibular, tonsillar, preauricular or posterior auricular adenopathy.     Left side of head: No submental, submandibular, tonsillar, preauricular or posterior auricular adenopathy.     Cervical: No cervical adenopathy.  Skin:  General: Skin is warm and dry.     Capillary Refill: Capillary refill takes less than 2 seconds.     Findings: No rash.  Neurological:     Mental Status: She is alert and oriented to person, place, and time.     Cranial Nerves: Cranial nerves are intact.     Gait: Gait is intact.     Deep Tendon Reflexes: Reflexes are normal and symmetric.     Reflex Scores:      Brachioradialis reflexes are 2+ on the right side and 2+ on the left side.      Patellar reflexes are 2+ on the right side and 2+ on the left  side. Psychiatric:        Attention and Perception: Attention normal.        Mood and Affect: Mood normal.        Speech: Speech normal.        Behavior: Behavior normal. Behavior is cooperative.        Thought Content: Thought content normal.        Judgment: Judgment normal.    Results for orders placed or performed in visit on 12/13/20  Novel Coronavirus, NAA ([facility])   Specimen: Nasopharyngeal(NP) swabs in vial transport medium  Result Value Ref Range   SARS-CoV-2, NAA Not Detected Not Detected  SARS-COV-2, NAA 2 DAY TAT  Result Value Ref Range   SARS-CoV-2, NAA 2 DAY TAT Performed   Culture, Group A Strep   Naso  Result Value Ref Range   Strep A Culture Negative   Veritor Flu A/B Waived  Result Value Ref Range   Influenza A Negative Negative   Influenza B Negative Negative  Specimen status report  Result Value Ref Range   specimen status report Comment       Assessment & Plan:   Problem List Items Addressed This Visit      Cardiovascular and Mediastinum   Hypertension    Chronic, stable with BP below goal. Continue low dose HCTZ, refills sent in. Recommend she monitor BP at least a few mornings a week at home and document.  DASH diet at home.  Labs today: CMP, TSH, lipid.  Return in 6 months.       Relevant Medications   hydrochlorothiazide (HYDRODIURIL) 12.5 MG tablet   rosuvastatin (CRESTOR) 5 MG tablet   Other Relevant Orders   CBC with Differential/Platelet   TSH   Microalbumin, Urine Waived     Other   Hyperlipidemia    Chronic, ongoing.  Is tolerating Crestor on 3 day week schedule.  Will continue this and adjust as needed.  Obtain lipid panel and CMP today.      Relevant Medications   hydrochlorothiazide (HYDRODIURIL) 12.5 MG tablet   rosuvastatin (CRESTOR) 5 MG tablet   Other Relevant Orders   Lipid Panel w/o Chol/HDL Ratio   Spinal stenosis    Chronic, stable at this time.  Continue to collaborate with neurosurgery and PT.  Have recommended  modest weight loss.  Continue Baclofen and Gabapentin at home, refills sent in.  No red flag symptoms today.  Return in 6 months.      Morbid obesity ([place]) - Primary    BMI 39.33 with HTN and Prediabetes.  Recommended eating smaller high protein, low fat meals more frequently and exercising 30 mins a day 5 times a week with a goal of 10-15lb weight loss in the next 3 months. Patient voiced their understanding and motivation to adhere to  these recommendations.       Vitamin D deficiency    Chronic, ongoing.  Continue daily supplement and recheck Vit D level today, adjust dosing as needed.  Recent DEXA with normal findings.      Relevant Orders   VITAMIN D 25 Hydroxy (Vit-D Deficiency, Fractures)   Prediabetes    Ongoing with A1C last 6.3%, she continues to be between 5.6 to 6.3% on review.  Will adjust plan of care and add medication as needed.  Continue diet focus at this time.  A1C and CMP today + urine ALB.      Relevant Orders   Comprehensive metabolic panel   Hemoglobin A1c   Myalgia due to statin    At this time is tolerating Crestor on 3 day a week schedule, adjust this as needed.   If symptoms present then consider weekly dosing or change to Repatha.  Lipid panel today.      BMI 39.0-39.9,adult    Refer to morbid obesity plan of care for further.       Other Visit Diagnoses    Yeast infection of the vagina       Recent abx use, Diflucan x 1 sent in.  Return if ongoing symptoms.   Relevant Medications   Zoster Vaccine Adjuvanted [facility]) injection (Start on 02/28/2021)   Zoster Vaccine Adjuvanted [facility]) injection   fluconazole (DIFLUCAN) 150 MG tablet   Need for hepatitis C screening test       Hep C screening on labs today per guidelines for one time screening.   Relevant Orders   Hepatitis C antibody   Encounter for screening mammogram for malignant neoplasm of breast       Mammogram order in place, patient to call and schedule.   Relevant Orders   MM 3D SCREEN  BREAST BILATERAL   Annual physical exam       Annual labs today to include CBC, CMP, TSH, lipid.  Health maintenance reviewed with patient.       Follow up plan: Return in about 6 months (around 06/29/2021) for HTN/HLD, PREDIABETES.   LABORATORY TESTING:  - Pap smear: not applicable  IMMUNIZATIONS:   - Tdap: Tetanus vaccination status reviewed: last tetanus booster within 10 years - Influenza: Up to date - Pneumovax: Up to date - Prevnar: Up to date - HPV: Not applicable - Zostavax vaccine: script sent today  SCREENING: -Mammogram: Up to date  - Colonoscopy: Up to date  - Bone Density: Up to date  -Hearing Test: Not applicable  -Spirometry: Not applicable   PATIENT COUNSELING:   Advised to take 1 mg of folate supplement per day if capable of pregnancy.   Sexuality: Discussed sexually transmitted diseases, partner selection, use of condoms, avoidance of unintended pregnancy  and contraceptive alternatives.   Advised to avoid cigarette smoking.  I discussed with the patient that most people either abstain from alcohol or drink within safe limits (<=14/week and <=4 drinks/occasion for males, <=7/weeks and <= 3 drinks/occasion for females) and that the risk for alcohol disorders and other health effects rises proportionally with the number of drinks per week and how often a drinker exceeds daily limits.  Discussed cessation/primary prevention of drug use and availability of treatment for abuse.   Diet: Encouraged to adjust caloric intake to maintain  or achieve ideal body weight, to reduce intake of dietary saturated fat and total fat, to limit sodium intake by avoiding high sodium foods and not adding table salt, and to maintain adequate  dietary potassium and calcium preferably from fresh fruits, vegetables, and low-fat dairy products.    Stressed the importance of regular exercise  Injury prevention: Discussed safety belts, safety helmets, smoke detector, smoking near bedding  or upholstery.   Dental health: Discussed importance of regular tooth brushing, flossing, and dental visits.    NEXT PREVENTATIVE PHYSICAL DUE IN 1 YEAR. Return in about 6 months (around 06/29/2021) for HTN/HLD, PREDIABETES.

## 2020-12-28 NOTE — Assessment & Plan Note (Signed)
Chronic, stable with BP below goal. Continue low dose HCTZ, refills sent in. Recommend she monitor BP at least a few mornings a week at home and document.  DASH diet at home.  Labs today: CMP, TSH, lipid.  Return in 6 months.

## 2020-12-28 NOTE — Assessment & Plan Note (Signed)
Refer to morbid obesity plan of care for further.

## 2020-12-28 NOTE — Assessment & Plan Note (Signed)
Chronic, ongoing.  Continue daily supplement and recheck Vit D level today, adjust dosing as needed.  Recent DEXA with normal findings.

## 2020-12-28 NOTE — Assessment & Plan Note (Signed)
Chronic, stable at this time.  Continue to collaborate with neurosurgery and PT.  Have recommended modest weight loss.  Continue Baclofen and Gabapentin at home, refills sent in.  No red flag symptoms today.  Return in 6 months.

## 2020-12-28 NOTE — Patient Instructions (Signed)
[facility]  Address: [address], [place], [place] [address]  Phone: [phone]   Mammogram A mammogram is an X-ray of the breasts. This is done to check for changes that are not normal. This test can look for changes that may be caused by breast cancer or other problems. Mammograms are regularly done on women beginning at age 69. A man may have a mammogram if he has a lump or swelling in his breast. Tell a doctor:  About any allergies you have.  If you have breast implants.  If you have had breast disease, biopsy, or surgery.  If you have a family history of breast cancer.  If you are breastfeeding.  Whether you are pregnant or may be pregnant. What are the risks? Generally, this is a safe procedure. But problems may occur, including:  Being exposed to radiation. Radiation levels are very low with this test.  The need for more tests.  The results were not read properly.  Trouble finding breast cancer in women with dense breasts. What happens before the test?  Have this test done about 1-2 weeks after your menstrual period. This is often when your breasts are the least tender.  If you are visiting a new doctor or clinic, have any past mammogram images sent to your new doctor's office.  Wash your breasts and under your arms on the day of the test.  Do not use deodorants, perfumes, lotions, or powders on the day of the test.  Take off any jewelry from your neck.  Wear clothes that you can change into and out of easily. What happens during the test?  You will take off your clothes from the waist up. You will put on a gown.  You will stand in front of the X-ray machine.  Each breast will be placed between two plastic or glass plates. The plates will press down on your breast for a few seconds. Try to relax. This does not cause any harm to your breasts. It may not feel comfortable, but it will be very brief.  X-rays will be  taken from different angles of each breast. The procedure may vary among doctors and hospitals.   What can I expect after the test?  The mammogram will be read by a specialist (radiologist).  You may need to do parts of the test again. This depends on the quality of the images.  You may go back to your normal activities.  It is up to you to get the results of your test. Ask how to get your results when they are ready. Summary  A mammogram is an X-ray of the breasts. It looks for changes that may be caused by breast cancer or other problems.  A man may have this test if he has a lump or swelling in his breast.  Before the test, tell your doctor about any breast problems that you have had in the past.  Have this test done about 1-2 weeks after your menstrual period.  Ask when your test results will be ready. Make sure you get your test results. This information is not intended to replace advice given to you by your health care provider. Make sure you discuss any questions you have with your health care provider. Document Revised: 07/12/2020 Document Reviewed: 07/12/2020 Elsevier Patient Education  2021 [employer].

## 2020-12-28 NOTE — Assessment & Plan Note (Signed)
Chronic, ongoing.  Is tolerating Crestor on 3 day week schedule.  Will continue this and adjust as needed.  Obtain lipid panel and CMP today.

## 2020-12-28 NOTE — Assessment & Plan Note (Signed)
BMI 39.33 with HTN and Prediabetes.  Recommended eating smaller high protein, low fat meals more frequently and exercising 30 mins a day 5 times a week with a goal of 10-15lb weight loss in the next 3 months. Patient voiced their understanding and motivation to adhere to these recommendations.

## 2020-12-29 ENCOUNTER — Ambulatory Visit: Payer: Medicare HMO

## 2020-12-29 LAB — CBC WITH DIFFERENTIAL/PLATELET
Basophils Absolute: 0.1 10*3/uL (ref 0.0–0.2)
Basos: 1 %
EOS (ABSOLUTE): 0.4 10*3/uL (ref 0.0–0.4)
Eos: 6 %
Hematocrit: 45.2 % (ref 34.0–46.6)
Hemoglobin: 14.9 g/dL (ref 11.1–15.9)
Immature Grans (Abs): 0 10*3/uL (ref 0.0–0.1)
Immature Granulocytes: 0 %
Lymphocytes Absolute: 3 10*3/uL (ref 0.7–3.1)
Lymphs: 40 %
MCH: 29.3 pg (ref 26.6–33.0)
MCHC: 33 g/dL (ref 31.5–35.7)
MCV: 89 fL (ref 79–97)
Monocytes Absolute: 0.6 10*3/uL (ref 0.1–0.9)
Monocytes: 8 %
Neutrophils Absolute: 3.4 10*3/uL (ref 1.4–7.0)
Neutrophils: 45 %
Platelets: 322 10*3/uL (ref 150–450)
RBC: 5.09 x10E6/uL (ref 3.77–5.28)
RDW: 13.1 % (ref 11.7–15.4)
WBC: 7.5 10*3/uL (ref 3.4–10.8)

## 2020-12-29 LAB — COMPREHENSIVE METABOLIC PANEL
ALT: 26 IU/L (ref 0–32)
AST: 29 IU/L (ref 0–40)
Albumin/Globulin Ratio: 1.5 (ref 1.2–2.2)
Albumin: 4.3 g/dL (ref 3.8–4.8)
Alkaline Phosphatase: 79 IU/L (ref 44–121)
BUN/Creatinine Ratio: 15 (ref 12–28)
BUN: 11 mg/dL (ref 8–27)
Bilirubin Total: 0.5 mg/dL (ref 0.0–1.2)
CO2: 26 mmol/L (ref 20–29)
Calcium: 9.6 mg/dL (ref 8.7–10.3)
Chloride: 98 mmol/L (ref 96–106)
Creatinine, Ser: 0.75 mg/dL (ref 0.57–1.00)
Globulin, Total: 2.8 g/dL (ref 1.5–4.5)
Glucose: 95 mg/dL (ref 65–99)
Potassium: 4.1 mmol/L (ref 3.5–5.2)
Sodium: 143 mmol/L (ref 134–144)
Total Protein: 7.1 g/dL (ref 6.0–8.5)
eGFR: 87 mL/min/{1.73_m2} (ref >59)

## 2020-12-29 LAB — HEPATITIS C ANTIBODY: Hep C Virus Ab: <0.1 s/co ratio (ref 0.0–0.9)

## 2020-12-29 LAB — HEMOGLOBIN A1C
Est. average glucose Bld gHb Est-mCnc: 126 mg/dL
Hgb A1c MFr Bld: 6 % — ABNORMAL HIGH (ref 4.8–5.6)

## 2020-12-29 LAB — VITAMIN D 25 HYDROXY (VIT D DEFICIENCY, FRACTURES): Vit D, 25-Hydroxy: 26.8 ng/mL — ABNORMAL LOW (ref 30.0–100.0)

## 2020-12-29 LAB — LIPID PANEL W/O CHOL/HDL RATIO
Cholesterol, Total: 188 mg/dL (ref 100–199)
HDL: 51 mg/dL (ref >39)
LDL Chol Calc (NIH): 105 mg/dL — ABNORMAL HIGH (ref 0–99)
Triglycerides: 186 mg/dL — ABNORMAL HIGH (ref 0–149)
VLDL Cholesterol Cal: 32 mg/dL (ref 5–40)

## 2020-12-29 LAB — TSH: TSH: 2.66 u[IU]/mL (ref 0.450–4.500)

## 2020-12-29 NOTE — Progress Notes (Signed)
Please let Kiara Parker know her physical labs have returned.  Hep C was negative.  CBC shows no anemia and kidney and liver function normal. Cholesterol levels continue to show mild elevation in LDL, but improved with 3 day a week Rosuvastatin -- in future we may try to adjust dose to 10 MG, but for now continue current dosing.  Thyroid normal.  A1c, diabetes testing, remains in prediabetic range at 6% -- but is coming down a little from past 6.3%.  Thyroid is normal.  Vitamin D level remains on lower side, please ensure you take Vitamin D3 2000 units daily.  Any questions? Keep being awesome!!  Thank you for allowing me to participate in your care. Kindest regards, Caeleb Batalla

## 2021-01-03 ENCOUNTER — Other Ambulatory Visit: Payer: Self-pay

## 2021-01-03 ENCOUNTER — Ambulatory Visit: Payer: Medicare HMO

## 2021-01-03 DIAGNOSIS — M545 Low back pain, unspecified: Secondary | ICD-10-CM

## 2021-01-03 DIAGNOSIS — G8929 Other chronic pain: Secondary | ICD-10-CM

## 2021-01-03 DIAGNOSIS — M48061 Spinal stenosis, lumbar region without neurogenic claudication: Secondary | ICD-10-CM | POA: Diagnosis not present

## 2021-01-03 NOTE — Therapy (Signed)
[facility] [address] [place], [address] Phone: [phone]   Fax:  [phone]  Physical Therapy Treatment  Patient Details  Name: Kiara Parker MRN: [id number] Date of Birth: [birth year] Referring Provider (PT): Marnee Guarneri NP (Dr. Jeanie Cooks)   Encounter Date: 01/03/2021   PT End of Session - 01/03/21 0852    Visit Number 7    Number of Visits 17    Date for PT Re-Evaluation 02/22/21    Authorization Type [employer]    Authorization Time Period 11/30/20-02/22/21    Authorization - Visit Number 7    Authorization - Number of Visits 10    PT Start Time [phone]   pt arrived late   PT Stop Time 0932    PT Time Calculation (min) 40 min    Activity Tolerance Patient tolerated treatment well;Patient limited by pain    Behavior During Therapy [facility] for tasks assessed/performed           Past Medical History:  Diagnosis Date  . Chronic pain syndrome   . Hyperlipidemia   . Hypertension   . Lumbago   . Spinal stenosis     Past Surgical History:  Procedure Laterality Date  . COLONOSCOPY WITH PROPOFOL N/A 11/27/2018   Procedure: COLONOSCOPY WITH PROPOFOL;  Surgeon: Jonathon Bellows, MD;  Location: [facility] ENDOSCOPY;  Service: Gastroenterology;  Laterality: N/A;  . TUBAL LIGATION      There were no vitals filed for this visit.   Subjective Assessment - 01/03/21 0854    Subjective Back is so far so good. A little stiff this morning. No pain currently. Was actually pretty good after session last week.    Pertinent History Chronic progressive lumbar spine disease, history of injections spinal injections, Years of back pain, followed by [facility] for 7+ years, but in the past 6 months now tolerates only ~5 minutes of standing, ~5 minutes of walking. Recent radiacular left lateral thigh pain now abated after epidural injection.    How long can you sit comfortably? unlimited    How long can you stand  comfortably? ~5 minutes    How long can you walk comfortably? ~5 minutes    Currently in Pain? No/denies                                     PT Education - 01/03/21 0855    Education Details ther-ex    Person(s) Educated Patient    Methods Explanation;Demonstration;Tactile cues;Verbal cues    Comprehension Returned demonstration;Verbalized understanding          Objectives  R lumbar paraspinal muscle tension palpated   MedbridgeAccess Code [email or web address]  Therapeutic exercise  Seated manually resisted L lateral shift isometrics to promote more neutral posture, PT manual resistance 10x3   Seated hip extension isometrics alternating R 10x3 L 10x3  Seated manually resistedLupper trunk rotation to promoteRlumbar rotation and a more neutral posture 10x3   Seated B scapular retraction to promote thoracic extension 10x3  Seated thoracic extension over chair to improve mobility and decrease stress to low back 10x3  Seated manually resisted trunk flexion isometrics with PT in neutral, 10x3  hooklying posterior pelvic tilt 10x3   Mini bridge 10x2  hoooklying clamshell blue band 10x4    Improved exercise technique, movement at target joints, use of target muscles after mod verbal, visual, tactile cues.  Mod cues  to not hold breath with exercises     Response to treatment Pt tolerated session well without aggravation of symptoms.   Clinical impression Good overall progress with decreased low back pain based on subjective reports. Continued working on posture, thoracic extension, trunk and glute strengthening to decrease stress to low back. Pt tolerated session well without aggravation of symptoms. Pt will benefit from continued skilled physical therapy services to decrease pain, improve strength and function.        PT Short Term Goals - 12/01/20 0813      PT SHORT TERM GOAL #1   Title  After 4 weeks pt to reports improved tolerance to walking>10 minutes with use of RW or 4WW to help manage back pain.    Baseline ~5 minutes    Time 4    Period Weeks    Status New    Target Date 12/29/20             PT Long Term Goals - 12/02/20 1603      PT LONG TERM GOAL #1   Title After 8 weeks pt to demonstrate FOTO score improvement >10 points to show improved functional ability.    Baseline to be performed at visit 2; Lumbar FOTO 36 (12/02/2020)    Time 8    Period Weeks    Status New    Target Date 01/26/21      PT LONG TERM GOAL #2   Title After 8 weeks pt to demonstrate 6MWT >872ft c LRAD    Time 8    Period Weeks    Status New    Target Date 01/26/21      PT LONG TERM GOAL #3   Title Pt to demonstrate improved 5xSTS <13second to demonstrate improved hip strength and trunk control    Baseline 16 sec    Time 12    Period Weeks    Status New    Target Date 02/23/21                 Plan - 01/03/21 0858    Clinical Impression Statement Good overall progress with decreased low back pain based on subjective reports. Continued working on posture, thoracic extension, trunk and glute strengthening to decrease stress to low back. Pt tolerated session well without aggravation of symptoms. Pt will benefit from continued skilled physical therapy services to decrease pain, improve strength and function.    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;Yard Work;Shop    Stability/Clinical Decision Making Stable/Uncomplicated    Rehab Potential Fair    PT Frequency 2x / week    PT Duration 12 weeks    PT Treatment/Interventions ADLs/Self Care Home Management;Moist Heat;Electrical Stimulation;DME Instruction;Gait training;Therapeutic exercise;Therapeutic activities;Stair  training;Patient/family education    PT Next Visit Plan Needs time to complete FOTO survery before or after session; set up [place] none set up at eval    Consulted and Agree with Plan of Care Patient           Patient will benefit from skilled therapeutic intervention in order to improve the following deficits and impairments:  Abnormal gait,Difficulty walking,Decreased strength,Postural dysfunction,Improper body mechanics,Impaired flexibility  Visit Diagnosis: Spinal stenosis of lumbar region, unspecified whether neurogenic claudication present  Chronic midline low back pain without sciatica     Problem List Patient Active Problem List   Diagnosis Date Noted  .  BMI 39.0-39.9,adult 09/13/2020  . Lumbar radiculitis 09/13/2020  . Myalgia due to statin 12/06/2018  . Prediabetes 12/05/2018  . Vitamin D deficiency 11/01/2018  . Insomnia 01/16/2017  . Morbid obesity ([place]) 12/13/2016  . Hyperlipidemia   . Hypertension   . Chronic pain syndrome   . Lumbago   . Spinal stenosis   . Displacement of lumbar intervertebral disc without myelopathy 03/11/2013    Joneen Boers PT, DPT   01/03/2021, 9:33 AM  [place] [facility] [address], [place], [address] Phone: [phone]   Fax:  [phone]  Name: Kiara Parker MRN: [id number] Date of Birth: December 07, 1950

## 2021-01-05 ENCOUNTER — Other Ambulatory Visit: Payer: Self-pay

## 2021-01-05 ENCOUNTER — Ambulatory Visit: Payer: Medicare HMO

## 2021-01-05 DIAGNOSIS — G8929 Other chronic pain: Secondary | ICD-10-CM | POA: Diagnosis not present

## 2021-01-05 DIAGNOSIS — M48061 Spinal stenosis, lumbar region without neurogenic claudication: Secondary | ICD-10-CM | POA: Diagnosis not present

## 2021-01-05 DIAGNOSIS — M545 Low back pain, unspecified: Secondary | ICD-10-CM | POA: Diagnosis not present

## 2021-01-05 NOTE — Therapy (Signed)
[name] [address] [place], [address] Phone: [phone]   Fax:  [phone]  Physical Therapy Treatment  Patient Details  Name: Kiara Parker MRN: [id number] Date of Birth: 1951/04/15 Referring Provider (PT): Marnee Guarneri NP (Dr. Jeanie Cooks)   Encounter Date: 01/05/2021   PT End of Session - 01/05/21 0849    Visit Number 8    Number of Visits 17    Date for PT Re-Evaluation 02/22/21    Authorization Type [employer]    Authorization Time Period 11/30/20-02/22/21    Authorization - Visit Number 8    Authorization - Number of Visits 10    PT Start Time 0849    PT Stop Time 0929    PT Time Calculation (min) 40 min    Activity Tolerance Patient tolerated treatment well    Behavior During Therapy [facility] for tasks assessed/performed           Past Medical History:  Diagnosis Date  . Chronic pain syndrome   . Hyperlipidemia   . Hypertension   . Lumbago   . Spinal stenosis     Past Surgical History:  Procedure Laterality Date  . COLONOSCOPY WITH PROPOFOL N/A 11/27/2018   Procedure: COLONOSCOPY WITH PROPOFOL;  Surgeon: Jonathon Bellows, MD;  Location: [facility] ENDOSCOPY;  Service: Gastroenterology;  Laterality: N/A;  . TUBAL LIGATION      There were no vitals filed for this visit.   Subjective Assessment - 01/05/21 0851    Subjective Back is so far, so good. Has only been up for 45 minutes. Its not anything like it was back in February... could not even put her foot down onto the floor.    Pertinent History Chronic progressive lumbar spine disease, history of injections spinal injections, Years of back pain, followed by [facility] for 7+ years, but in the past 6 months now tolerates only ~5 minutes of standing, ~5 minutes of walking. Recent radiacular left lateral thigh pain now abated after epidural injection.    How long can you sit comfortably? unlimited    How long can you stand  comfortably? ~5 minutes    How long can you walk comfortably? ~5 minutes    Currently in Pain? Yes    Pain Score 3     Pain Location Back              [facility] PT Assessment - 01/05/21 1708      Observation/Other Assessments   Focus on Therapeutic Outcomes (FOTO)  Lumbar FOTO 42      Special Tests   Other special tests 5 times sit <> stand 10 seconds; 6 MWT 1500 ft, no AD                                 PT Education - 01/05/21 1708    Education Details ther-ex    Person(s) Educated Patient    Methods Explanation;Demonstration;Tactile cues;Verbal cues    Comprehension Returned demonstration;Verbalized understanding          Objectives  R lumbar paraspinal muscle tension palpated   MedbridgeAccess Code [email or web address]  Manual therapy seated STM B thoracolumbar paraspinal muscle to decrease tension R > L   Therapeutic exercise   Sit <> stand 5x   Performed in 10 seconds.   Gait x 6 minutes. No AD. Pt able to ambnulate 1500 ft  Just a little twinge in  low back in sitting afterwards.   Therapeutic rest break provided afterwards to promote recovery for next exercise  Seated manually resisted L lateral shift isometrics to promote more neutral posture, PT manual resistance 10x3   Seated manually resistedLupper trunk rotation to promoteRlumbar rotation and a more neutral posture 10x3   Seated B scapular retraction to promote thoracic extension 10x3   Improved exercise technique, movement at target joints, use of target muscles after mod verbal, visual, tactile cues.  Min to mod cues to not hold breath with exercises     Response to treatment Pt tolerated session well without aggravation of symptoms.  Clinical impression Pt demonstrates significant improvement in ability to ambulate longer dsitances without AD, improved functional strength and improvement in pain based on subjective reports. Pt tolerated session well without  aggravation of symptoms. Pt will benefit from continued skilled physical therapy services to decrease pain, improve strength and function.        PT Short Term Goals - 12/01/20 0813      PT SHORT TERM GOAL #1   Title After 4 weeks pt to reports improved tolerance to walking>10 minutes with use of RW or 4WW to help manage back pain.    Baseline ~5 minutes    Time 4    Period Weeks    Status New    Target Date 12/29/20             PT Long Term Goals - 01/05/21 0901      PT LONG TERM GOAL #1   Title After 8 weeks pt to demonstrate FOTO score improvement >10 points to show improved functional ability.    Baseline to be performed at visit 2; Lumbar FOTO 36 (12/02/2020)    Time 8    Period Weeks    Status New    Target Date 01/26/21      PT LONG TERM GOAL #2   Title After 8 weeks pt to demonstrate 6MWT >812ft c LRAD    Baseline Pt able to ambulate 1500 ft without AD (01/05/2021)    Time 8    Period Weeks    Status Achieved    Target Date 01/26/21      PT LONG TERM GOAL #3   Title Pt to demonstrate improved 5xSTS <13second to demonstrate improved hip strength and trunk control    Baseline 16 sec; 10 seconds (01/05/2021)    Time 12    Period Weeks    Status Achieved    Target Date 01/26/21                 Plan - 01/05/21 0918    Clinical Impression Statement Pt demonstrates significant improvement in ability to ambulate longer dsitances without AD, improved functional strength and improvement in pain based on subjective reports. Pt tolerated session well without aggravation of symptoms. Pt will benefit from continued skilled physical therapy services to decrease pain, improve strength and function.    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;Yard Work;Shop    Stability/Clinical  Decision Making Stable/Uncomplicated    Clinical Decision Making Low    Rehab Potential Fair    PT Frequency 2x / week    PT Duration 12 weeks    PT Treatment/Interventions ADLs/Self Care Home Management;Moist Heat;Electrical Stimulation;DME Instruction;Gait training;Therapeutic exercise;Therapeutic activities;Stair training;Patient/family education    PT Next Visit Plan Needs time to complete FOTO survery before or after session; set  up [place] none set up at eval    Consulted and Agree with Plan of Care Patient           Patient will benefit from skilled therapeutic intervention in order to improve the following deficits and impairments:  Abnormal gait,Difficulty walking,Decreased strength,Postural dysfunction,Improper body mechanics,Impaired flexibility  Visit Diagnosis: Spinal stenosis of lumbar region, unspecified whether neurogenic claudication present  Chronic midline low back pain without sciatica     Problem List Patient Active Problem List   Diagnosis Date Noted  . BMI 39.0-39.9,adult 09/13/2020  . Lumbar radiculitis 09/13/2020  . Myalgia due to statin 12/06/2018  . Prediabetes 12/05/2018  . Vitamin D deficiency 11/01/2018  . Insomnia 01/16/2017  . Morbid obesity ([place]) 12/13/2016  . Hyperlipidemia   . Hypertension   . Chronic pain syndrome   . Lumbago   . Spinal stenosis   . Displacement of lumbar intervertebral disc without myelopathy 03/11/2013    Joneen Boers PT, DPT   01/05/2021, 5:14 PM  [place] [address] [place], [address] Phone: [phone]   Fax:  [phone]  Name: Kiara Parker MRN: [id number] Date of Birth: 10/06/1950

## 2021-01-10 ENCOUNTER — Ambulatory Visit: Payer: Medicare HMO

## 2021-01-10 ENCOUNTER — Other Ambulatory Visit: Payer: Self-pay

## 2021-01-10 DIAGNOSIS — G8929 Other chronic pain: Secondary | ICD-10-CM

## 2021-01-10 DIAGNOSIS — M48061 Spinal stenosis, lumbar region without neurogenic claudication: Secondary | ICD-10-CM | POA: Diagnosis not present

## 2021-01-10 DIAGNOSIS — M545 Low back pain, unspecified: Secondary | ICD-10-CM

## 2021-01-10 NOTE — Therapy (Signed)
[place] [address] [place], [address] Phone: [phone]   Fax:  [phone]  Physical Therapy Treatment  Patient Details  Name: Kiara Parker MRN: [id number] Date of Birth: Jan 06, 1951 Referring Provider (PT): Marnee Guarneri NP (Dr. Jeanie Cooks)   Encounter Date: 01/10/2021   PT End of Session - 01/10/21 1102    Visit Number 9    Number of Visits 17    Date for PT Re-Evaluation 02/22/21    Authorization Type [employer]    Authorization Time Period 11/30/20-02/22/21    Authorization - Visit Number 9    Authorization - Number of Visits 10    PT Start Time 1101    PT Stop Time 1142    PT Time Calculation (min) 41 min    Activity Tolerance Patient tolerated treatment well    Behavior During Therapy [facility] for tasks assessed/performed           Past Medical History:  Diagnosis Date  . Chronic pain syndrome   . Hyperlipidemia   . Hypertension   . Lumbago   . Spinal stenosis     Past Surgical History:  Procedure Laterality Date  . COLONOSCOPY WITH PROPOFOL N/A 11/27/2018   Procedure: COLONOSCOPY WITH PROPOFOL;  Surgeon: Jonathon Bellows, MD;  Location: [facility] ENDOSCOPY;  Service: Gastroenterology;  Laterality: N/A;  . TUBAL LIGATION      There were no vitals filed for this visit.   Subjective Assessment - 01/10/21 1103    Subjective Pt states that everything is sore today. Did not sleep good for the past 2 days. Had to cook yesterday. Went over to her sister's. Put cream on her back before coming here. 3/10 back pain currently. 5/10 an hours ago. 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 Made pound cakes Saturday. Too much time standing on her feet for Friday and Saturday.    Pertinent History Chronic progressive lumbar spine disease, history of injections spinal injections, Years of back pain, followed by [facility] for 7+ years, but  in the past 6 months now tolerates only ~5 minutes of standing, ~5 minutes of walking. Recent radiacular left lateral thigh pain now abated after epidural injection.    How long can you sit comfortably? unlimited    How long can you stand comfortably? ~5 minutes    How long can you walk comfortably? ~5 minutes    Currently in Pain? Yes    Pain Score 3     Pain Location Back                                     PT Education - 01/10/21 1107    Education Details ther-ex    Person(s) Educated Patient    Methods Explanation;Demonstration;Tactile cues;Verbal cues    Comprehension Returned demonstration;Verbalized understanding          Objectives  R lumbar paraspinal muscle tension palpated   MedbridgeAccess Code [email or web address]  Therapeutic exercise  Seated manually resisted L lateral shift isometrics to promote more neutral posture, PT manual resistance 10x4  Seated manually resistedLupper trunk rotation to promoteRlumbar rotation and a more neutral posture 10x3   Seated B shoulder extension isometrics, hands on thighs 10x3   Seated hip extension isometrics alternating R 10x3 L 10x3  Standing L shoulder adduction green band to decrease R  lateral shift 10x2  Seated trunk flexion 8x. Tough to breathe, exercise stopped.   Seated manually resisted hip extension   L 10x3  Seated B scapular retraction to promote thoracic extension 10x3   Seated thoracic extension over chair to improve mobility and decrease stress to low back 10x3     Improved exercise technique, movement at target joints, use of target muscles after mod verbal, visual, tactile cues.   Mod cues to not hold breath with exercises   Manual therapy seated STM B thoracolumbar paraspinal muscle to decrease tension R > L      Response to treatment No low back pain in sitting after session.      Clinical impression Continued working on  improving thoracic extension, trunk and glute strength as well as decreasing R lateral shift posture to help decrease stress to low back. No low back pain in sitting after session reported. Pt will benefit from continued skilled physical therapy services to decrease pain, improve strength and function.         PT Short Term Goals - 12/01/20 0813      PT SHORT TERM GOAL #1   Title After 4 weeks pt to reports improved tolerance to walking>10 minutes with use of RW or 4WW to help manage back pain.    Baseline ~5 minutes    Time 4    Period Weeks    Status New    Target Date 12/29/20             PT Long Term Goals - 01/05/21 0901      PT LONG TERM GOAL #1   Title After 8 weeks pt to demonstrate FOTO score improvement >10 points to show improved functional ability.    Baseline to be performed at visit 2; Lumbar FOTO 36 (12/02/2020)    Time 8    Period Weeks    Status New    Target Date 01/26/21      PT LONG TERM GOAL #2   Title After 8 weeks pt to demonstrate 6MWT >866ft c LRAD    Baseline Pt able to ambulate 1500 ft without AD (01/05/2021)    Time 8    Period Weeks    Status Achieved    Target Date 01/26/21      PT LONG TERM GOAL #3   Title Pt to demonstrate improved 5xSTS <13second to demonstrate improved hip strength and trunk control    Baseline 16 sec; 10 seconds (01/05/2021)    Time 12    Period Weeks    Status Achieved    Target Date 01/26/21                 Plan - 01/10/21 1113    Clinical Impression Statement Continued working on improving thoracic extension, trunk and glute strength as well as decreasing R lateral shift posture to help decrease stress to low back. No low back pain in sitting after session reported. Pt will benefit from continued skilled physical therapy services to decrease pain, improve strength and function.    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;Yard Work;Shop    Stability/Clinical Decision Making Stable/Uncomplicated    Clinical Decision Making Low    Rehab Potential Fair    PT Frequency 2x / week    PT Duration 12 weeks    PT Treatment/Interventions ADLs/Self Care Home Management;Moist Heat;Electrical Stimulation;DME Instruction;Gait training;Therapeutic exercise;Therapeutic activities;Stair training;Patient/family education  PT Next Visit Plan Needs time to complete FOTO survery before or after session; set up [place] none set up at eval    Consulted and Agree with Plan of Care Patient           Patient will benefit from skilled therapeutic intervention in order to improve the following deficits and impairments:  Abnormal gait,Difficulty walking,Decreased strength,Postural dysfunction,Improper body mechanics,Impaired flexibility  Visit Diagnosis: Spinal stenosis of lumbar region, unspecified whether neurogenic claudication present  Chronic midline low back pain without sciatica     Problem List Patient Active Problem List   Diagnosis Date Noted  . BMI 39.0-39.9,adult 09/13/2020  . Lumbar radiculitis 09/13/2020  . Myalgia due to statin 12/06/2018  . Prediabetes 12/05/2018  . Vitamin D deficiency 11/01/2018  . Insomnia 01/16/2017  . Morbid obesity ([place]) 12/13/2016  . Hyperlipidemia   . Hypertension   . Chronic pain syndrome   . Lumbago   . Spinal stenosis   . Displacement of lumbar intervertebral disc without myelopathy 03/11/2013    Joneen Boers PT, DPT   01/10/2021, 1:07 PM  [place] [facility] [address] [place], [address] Phone: [phone]   Fax:  [phone]  Name: Kiara Parker MRN: [id number] Date of Birth: 1951-09-16

## 2021-01-12 ENCOUNTER — Other Ambulatory Visit: Payer: Self-pay

## 2021-01-12 ENCOUNTER — Ambulatory Visit: Payer: Medicare HMO

## 2021-01-12 DIAGNOSIS — M48061 Spinal stenosis, lumbar region without neurogenic claudication: Secondary | ICD-10-CM | POA: Diagnosis not present

## 2021-01-12 DIAGNOSIS — G8929 Other chronic pain: Secondary | ICD-10-CM | POA: Diagnosis not present

## 2021-01-12 DIAGNOSIS — M545 Low back pain, unspecified: Secondary | ICD-10-CM

## 2021-01-12 NOTE — Therapy (Signed)
[facility] [address] [place], [address] Phone: [phone]   Fax:  [phone]  Physical Therapy Treatment And Progress Report (11/30/2020 - 01/12/2021)  Patient Details  Name: Kiara Parker MRN: [id number] Date of Birth: 09-17-1951 Referring Provider (PT): Marnee Guarneri NP (Dr. Jeanie Cooks)   Encounter Date: 01/12/2021   PT End of Session - 01/12/21 0851    Visit Number 10    Number of Visits 17    Date for PT Re-Evaluation 02/22/21    Authorization Type [employer]    Authorization Time Period 11/30/20-02/22/21    Authorization - Visit Number 10    Authorization - Number of Visits 10    PT Start Time 0851    PT Stop Time 0932    PT Time Calculation (min) 41 min    Activity Tolerance Patient tolerated treatment well    Behavior During Therapy [facility] for tasks assessed/performed           Past Medical History:  Diagnosis Date  . Chronic pain syndrome   . Hyperlipidemia   . Hypertension   . Lumbago   . Spinal stenosis     Past Surgical History:  Procedure Laterality Date  . COLONOSCOPY WITH PROPOFOL N/A 11/27/2018   Procedure: COLONOSCOPY WITH PROPOFOL;  Surgeon: Jonathon Bellows, MD;  Location: [facility] ENDOSCOPY;  Service: Gastroenterology;  Laterality: N/A;  . TUBAL LIGATION      There were no vitals filed for this visit.   Subjective Assessment - 01/12/21 0852    Subjective Just R low back pain, 2-3/10 currently (pt sitting). 5/10 at most for the past 7 days but she had to cook all day. Still better than before. Feels like she can graduation PT next week.    Pertinent History Chronic progressive lumbar spine disease, history of injections spinal injections, Years of back pain, followed by [facility] for 7+ years, but in the past 6 months now tolerates only ~5 minutes of standing, ~5 minutes of walking. Recent radiacular left lateral thigh pain now abated after epidural injection.    How  long can you sit comfortably? unlimited    How long can you stand comfortably? ~5 minutes    How long can you walk comfortably? ~5 minutes    Currently in Pain? Yes    Pain Score 3               [facility] PT Assessment - 01/12/21 1918      Special Tests   Other special tests 5 times sit <> stand 10 seconds; 6 MWT 1500 ft, no AD   01/05/2021                                PT Education - 01/12/21 1918    Education Details ther-ex    Person(s) Educated Patient    Methods Explanation;Demonstration;Tactile cues;Verbal cues    Comprehension Returned demonstration;Verbalized understanding           Objectives  R lumbar paraspinal muscle tension palpated   MedbridgeAccess Code [email or web address]  Therapeutic exercise  Standing R lateral shift correction 10x3  Slight decrease in low back pain after first 2 sets.   R S/L position (to correct R lateral shift posture)   Decreased pain in low back.   R S/L clamshell 10x2. L low back oull which eases with rest   Seated manually resisted L lateral shift isometrics  to promote more neutral posture, PT manual resistance 10x4   Seated B scapular retraction to promote thoracic extension 10x3  Seated hip extension isometrics alternating R 10x2 L 10x2   Improved exercise technique, movement at target joints, use of target muscles after mod verbal, visual, tactile cues.   Mod cues to not hold breath with exercises   Manual therapy R S/L STM R thoracolumbar paraspinal muscle to decrease tension   Response to treatment No back pain in sitting after treatment.   Clinical impression Worked on decreasing R lateral shift posture as well as decreasing R thoracolumbar paraspinal muscle tension to help decrease tension to low back. Decreased low back pain after treatment. Pt demonstrates overall improved function, ability to ambulate, as well as functional strength since initial  evaluation. Pt making progress with PT towards goals. Pt will benefit from continued skilled physical therapy services to decrease pain, improve strength and ability to ambulate and perform functional tasks more comfortably.      PT Short Term Goals - 01/12/21 1919      PT SHORT TERM GOAL #1   Title After 4 weeks pt to reports improved tolerance to walking>10 minutes with use of RW or 4WW to help manage back pain.    Baseline ~5 minutes; pt currently able to ambulate without AD (01/12/2021)    Time 4    Period Weeks    Status Achieved    Target Date 12/29/20             PT Long Term Goals - 01/12/21 1920      PT LONG TERM GOAL #1   Title After 8 weeks pt to demonstrate FOTO score improvement >10 points to show improved functional ability.    Baseline to be performed at visit 2; Lumbar FOTO 36 (12/02/2020); FOTO 42 (01/12/2021)    Time 8    Period Weeks    Status Partially Met    Target Date 01/26/21      PT LONG TERM GOAL #2   Title After 8 weeks pt to demonstrate 6MWT >835f c LRAD    Baseline Pt able to ambulate 1500 ft without AD (01/05/2021)    Time 8    Period Weeks    Status Achieved    Target Date 01/26/21      PT LONG TERM GOAL #3   Title Pt to demonstrate improved 5xSTS <13second to demonstrate improved hip strength and trunk control    Baseline 16 sec; 10 seconds (01/05/2021)    Time 12    Period Weeks    Status Achieved    Target Date 01/26/21                 Plan - 01/12/21 1919    Clinical Impression Statement Worked on decreasing R lateral shift posture as well as decreasing R thoracolumbar paraspinal muscle tension to help decrease tension to low back. Decreased low back pain after treatment. Pt demonstrates overall improved function, ability to ambulate, as well as functional strength since initial evaluation. Pt making progress with PT towards goals. Pt will benefit from continued skilled physical therapy services to decrease pain, improve strength  and ability to ambulate and perform functional tasks more comfortably.    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;Shop    Stability/Clinical Decision Making Stable/Uncomplicated    Rehab Potential Fair    PT Frequency 2x / week  PT Duration 12 weeks    PT Treatment/Interventions ADLs/Self Care Home Management;Moist Heat;Electrical Stimulation;DME Instruction;Gait training;Therapeutic exercise;Therapeutic activities;Stair training;Patient/family education    PT Next Visit Plan Needs time to complete FOTO survery before or after session; set up [place] none set up at eval    Consulted and Agree with Plan of Care Patient           Patient will benefit from skilled therapeutic intervention in order to improve the following deficits and impairments:  Abnormal gait,Difficulty walking,Decreased strength,Postural dysfunction,Improper body mechanics,Impaired flexibility  Visit Diagnosis: Spinal stenosis of lumbar region, unspecified whether neurogenic claudication present  Chronic midline low back pain without sciatica     Problem List Patient Active Problem List   Diagnosis Date Noted  . BMI 39.0-39.9,adult 09/13/2020  . Lumbar radiculitis 09/13/2020  . Myalgia due to statin 12/06/2018  . Prediabetes 12/05/2018  . Vitamin D deficiency 11/01/2018  . Insomnia 01/16/2017  . Morbid obesity ([place]) 12/13/2016  . Hyperlipidemia   . Hypertension   . Chronic pain syndrome   . Lumbago   . Spinal stenosis   . Displacement of lumbar intervertebral disc without myelopathy 03/11/2013    Thank you for your referral.  Joneen Boers PT, DPT   01/12/2021, 7:24 PM  [facility] [address]  [place], [address] Phone: [phone]   Fax:  [phone]  Name: Kiara Parker MRN: [id number] Date of Birth: Sep 15, 1951

## 2021-01-17 ENCOUNTER — Ambulatory Visit: Payer: Medicare HMO

## 2021-01-17 ENCOUNTER — Other Ambulatory Visit: Payer: Self-pay

## 2021-01-17 DIAGNOSIS — G8929 Other chronic pain: Secondary | ICD-10-CM

## 2021-01-17 DIAGNOSIS — M545 Low back pain, unspecified: Secondary | ICD-10-CM | POA: Diagnosis not present

## 2021-01-17 DIAGNOSIS — M48061 Spinal stenosis, lumbar region without neurogenic claudication: Secondary | ICD-10-CM

## 2021-01-17 NOTE — Therapy (Signed)
[facility] [address] [place], [address] Phone: [phone]   Fax:  [phone]  Physical Therapy Treatment  Patient Details  Name: Kiara Parker MRN: [id number] Date of Birth: 1951/03/27 Referring Provider (PT): Marnee Guarneri NP (Dr. Jeanie Cooks)   Encounter Date: 01/17/2021   PT End of Session - 01/17/21 0853    Visit Number 11    Number of Visits 17    Date for PT Re-Evaluation 02/22/21    Authorization Type [employer]    Authorization Time Period 11/30/20-02/22/21    Authorization - Visit Number 1    Authorization - Number of Visits 10    PT Start Time [phone]   pt arrived late   PT Stop Time 0923    PT Time Calculation (min) 30 min    Activity Tolerance Patient tolerated treatment well    Behavior During Therapy [facility] for tasks assessed/performed           Past Medical History:  Diagnosis Date  . Chronic pain syndrome   . Hyperlipidemia   . Hypertension   . Lumbago   . Spinal stenosis     Past Surgical History:  Procedure Laterality Date  . COLONOSCOPY WITH PROPOFOL N/A 11/27/2018   Procedure: COLONOSCOPY WITH PROPOFOL;  Surgeon: Jonathon Bellows, MD;  Location: [facility] ENDOSCOPY;  Service: Gastroenterology;  Laterality: N/A;  . TUBAL LIGATION      There were no vitals filed for this visit.   Subjective Assessment - 01/17/21 0854    Subjective Mid low back is a little sore this morning. 3/10 currently.    Pertinent History Chronic progressive lumbar spine disease, history of injections spinal injections, Years of back pain, followed by [facility] for 7+ years, but in the past 6 months now tolerates only ~5 minutes of standing, ~5 minutes of walking. Recent radiacular left lateral thigh pain now abated after epidural injection.    How long can you sit comfortably? unlimited    How long can you stand comfortably? ~5 minutes    How long can you walk comfortably? ~5 minutes    Currently  in Pain? Yes    Pain Score 3     Pain Location Back    Pain Orientation Lower    Pain Descriptors / Indicators Sore                                     PT Education - 01/17/21 0901    Education Details ther-ex    Person(s) Educated Patient    Methods Explanation;Demonstration;Tactile cues;Verbal cues    Comprehension Returned demonstration;Verbalized understanding          Objectives  R lumbar paraspinal muscle tension palpated   MedbridgeAccess Code [email or web address]  Therapeutic exercise  R S/L position (to correct R lateral shift posture)         R S/L hip abduction 10x2. Uncomfortable. Symptoms return to baseline at rest  R S/L clamshell 10x2. L low back pull which eases with rest  R S/L transversus abdominis contraction 10x4   No back pain after aformentinoed exercises   Seated manually resisted L lateral shift isometrics to promote more neutral posture, PT manual resistance 10x4    Improved exercise technique, movement at target joints, use of target muscles after mod verbal, visual, tactile cues.  Min to mod cues to not hold breath with exercises  Manual therapy R S/L STM R thoracolumbar paraspinal muscle to decrease tension  Seated STM R quadratus lumborum muscle  R low back soreness   Response to treatment Fair tolerance to today's session   Clinical impression Continued working on decreasing lateral shift posture, improving trunk and glute strength to decrease stress to low back, and secondary to trunk and hip weakness. Fair tolerance to today's session. Pt will benefit from continued skilled physical therapy services to decrease pain, improve strength and function.      PT Short Term Goals - 01/12/21 1919      PT SHORT TERM GOAL #1   Title After 4 weeks pt to reports improved tolerance to walking>10 minutes with use of RW or 4WW to help manage back pain.    Baseline ~5 minutes; pt currently able to  ambulate without AD (01/12/2021)    Time 4    Period Weeks    Status Achieved    Target Date 12/29/20             PT Long Term Goals - 01/12/21 1920      PT LONG TERM GOAL #1   Title After 8 weeks pt to demonstrate FOTO score improvement >10 points to show improved functional ability.    Baseline to be performed at visit 2; Lumbar FOTO 36 (12/02/2020); FOTO 42 (01/12/2021)    Time 8    Period Weeks    Status Partially Met    Target Date 01/26/21      PT LONG TERM GOAL #2   Title After 8 weeks pt to demonstrate 6MWT >866f c LRAD    Baseline Pt able to ambulate 1500 ft without AD (01/05/2021)    Time 8    Period Weeks    Status Achieved    Target Date 01/26/21      PT LONG TERM GOAL #3   Title Pt to demonstrate improved 5xSTS <13second to demonstrate improved hip strength and trunk control    Baseline 16 sec; 10 seconds (01/05/2021)    Time 12    Period Weeks    Status Achieved    Target Date 01/26/21                 Plan - 01/17/21 0901    Clinical Impression Statement Continued working on decreasing lateral shift posture, improving trunk and glute strength to decrease stress to low back, and secondary to trunk and hip weakness. Fair tolerance to today's session. Pt will benefit from continued skilled physical therapy services to decrease pain, improve strength and function.    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;Shop    Stability/Clinical Decision Making Stable/Uncomplicated    Clinical Decision Making Low    Rehab Potential Fair    PT Frequency 2x / week    PT Duration 12 weeks    PT Treatment/Interventions ADLs/Self Care Home Management;Moist Heat;Electrical Stimulation;DME Instruction;Gait training;Therapeutic exercise;Therapeutic activities;Stair  training;Patient/family education    PT Next Visit Plan Needs time to complete FOTO survery before or after session; set up [place]none set up at eval    Consulted and Agree with Plan of Care Patient           Patient will benefit from skilled therapeutic intervention in order to improve the following deficits and impairments:  Abnormal gait,Difficulty walking,Decreased strength,Postural dysfunction,Improper body mechanics,Impaired flexibility  Visit Diagnosis: Spinal  stenosis of lumbar region, unspecified whether neurogenic claudication present  Chronic midline low back pain without sciatica     Problem List Patient Active Problem List   Diagnosis Date Noted  . BMI 39.0-39.9,adult 09/13/2020  . Lumbar radiculitis 09/13/2020  . Myalgia due to statin 12/06/2018  . Prediabetes 12/05/2018  . Vitamin D deficiency 11/01/2018  . Insomnia 01/16/2017  . Morbid obesity ([place]) 12/13/2016  . Hyperlipidemia   . Hypertension   . Chronic pain syndrome   . Lumbago   . Spinal stenosis   . Displacement of lumbar intervertebral disc without myelopathy 03/11/2013    Joneen Boers PT, DPT   01/17/2021, 1:12 PM  [facility] [address], [place], [address] Phone: [phone]   Fax:  [phone]  Name: Kiara Parker MRN: [id number] Date of Birth: 07-28-1951

## 2021-01-19 ENCOUNTER — Ambulatory Visit: Payer: Medicare HMO

## 2021-01-19 ENCOUNTER — Other Ambulatory Visit: Payer: Self-pay

## 2021-01-19 DIAGNOSIS — M545 Low back pain, unspecified: Secondary | ICD-10-CM | POA: Diagnosis not present

## 2021-01-19 DIAGNOSIS — G8929 Other chronic pain: Secondary | ICD-10-CM

## 2021-01-19 DIAGNOSIS — M48061 Spinal stenosis, lumbar region without neurogenic claudication: Secondary | ICD-10-CM

## 2021-01-19 NOTE — Therapy (Addendum)
[facility] [address] [place], [address] Phone: [phone]   Fax:  [phone]  Physical Therapy Treatment  Patient Details  Name: Kiara Parker MRN: [id number] Date of Birth: 1951/05/12 Referring Provider (PT): Marnee Guarneri NP (Dr. Jeanie Cooks)   Encounter Date: 01/19/2021   PT End of Session - 01/19/21 0852    Visit Number 12    Number of Visits 17    Date for PT Re-Evaluation 02/22/21    Authorization Type [employer]    Authorization Time Period 11/30/20-02/22/21    PT Start Time 0850    PT Stop Time 0928    PT Time Calculation (min) 38 min    Activity Tolerance Patient tolerated treatment well;No increased pain;Patient limited by fatigue    Behavior During Therapy [facility] for tasks assessed/performed           Past Medical History:  Diagnosis Date  . Chronic pain syndrome   . Hyperlipidemia   . Hypertension   . Lumbago   . Spinal stenosis     Past Surgical History:  Procedure Laterality Date  . COLONOSCOPY WITH PROPOFOL N/A 11/27/2018   Procedure: COLONOSCOPY WITH PROPOFOL;  Surgeon: Jonathon Bellows, MD;  Location: [facility] ENDOSCOPY;  Service: Gastroenterology;  Laterality: N/A;  . TUBAL LIGATION      There were no vitals filed for this visit.   Subjective Assessment - 01/19/21 0851    Subjective Pt doing good today. No significant updates since last session. No medical or Rx updates. Does HEP when able, but no exacerbation with performance.    Pertinent History Chronic progressive lumbar spine disease, history of injections spinal injections, Years of back pain, followed by [facility] for 7+ years, but in the past 6 months now tolerates only ~5 minutes of standing, ~5 minutes of walking. Recent radiacular left lateral thigh pain now abated after epidural injection.    Pain Score 3     Pain Location --   across the lower back          INTERVENTION THIS DATE:  Medbridge Access  Code [email or web address]   Therapeutic exercise   R S/L position x15 minutes for postural stretching, concurrent to exercises below        R S/L hip abduction 3x10 AA/ROM for correct movement and factilitate weakness    R S/L clamshell 2x10    Hooklying with foot of table elevated 20 degrees to promote lumbar flexion stretch  Hooklying Marching 2x30, redTB   Hooklying Band Clam 2x15x5secH, RedTB    STS from elevated surface 2x15  Seated Marching 1x15 bilat  Side stepping with redTB resistance at knees 1x15 bilat   Standing Heel raises c // bar support 2x15  Standing ankle DF c // bar support (heels on 2x4 board) 1x15  Side stepping with GrenTB resistance at knees 1x15 bilat      PT Short Term Goals - 01/12/21 1919      PT SHORT TERM GOAL #1   Title After 4 weeks pt to reports improved tolerance to walking>10 minutes with use of RW or 4WW to help manage back pain.    Baseline ~5 minutes; pt currently able to ambulate without AD (01/12/2021)    Time 4    Period Weeks    Status Achieved    Target Date 12/29/20             PT Long Term Goals - 01/12/21 1920  PT LONG TERM GOAL #1   Title After 8 weeks pt to demonstrate FOTO score improvement >10 points to show improved functional ability.    Baseline to be performed at visit 2; Lumbar FOTO 36 (12/02/2020); FOTO 42 (01/12/2021)    Time 8    Period Weeks    Status Partially Met    Target Date 01/26/21      PT LONG TERM GOAL #2   Title After 8 weeks pt to demonstrate 6MWT >851f c LRAD    Baseline Pt able to ambulate 1500 ft without AD (01/05/2021)    Time 8    Period Weeks    Status Achieved    Target Date 01/26/21      PT LONG TERM GOAL #3   Title Pt to demonstrate improved 5xSTS <13second to demonstrate improved hip strength and trunk control    Baseline 16 sec; 10 seconds (01/05/2021)    Time 12    Period Weeks    Status Achieved    Target Date 01/26/21                 Plan - 01/19/21 0856    Clinical  Impression Statement Continued with current plan of care as laid out in evaluation and recent prior sessions. Pt remains motivated to advance progress toward goals. Rest breaks provided as needed, pt quick to ask when needed. Author maintains all interventions within appropriate level of intensity as not to purposefully exacerbate pain. Pt does require varying levels of assistance and cuing for completion of exercises for correct form and sometimes due to pain/weakness. Pt continues to demonstrate progress toward goals AEB progression of some interventions this date either in volume or intensity. No updates to HEP this date.    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;Shop    Stability/Clinical Decision Making Stable/Uncomplicated    Clinical Decision Making Low    Rehab Potential Fair    PT Frequency 2x / week    PT Duration 12 weeks    PT Treatment/Interventions ADLs/Self Care Home Management;Moist Heat;Electrical Stimulation;DME Instruction;Gait training;Therapeutic exercise;Therapeutic activities;Stair training;Patient/family education    PT Next Visit Plan Continue with current POC    PT Home Exercise Plan No updates 4/27    Consulted and Agree with Plan of Care Patient           Patient will benefit from skilled therapeutic intervention in order to improve the following deficits and impairments:  Abnormal gait,Difficulty walking,Decreased strength,Postural dysfunction,Improper body mechanics,Impaired flexibility  Visit Diagnosis: Spinal stenosis of lumbar region, unspecified whether neurogenic claudication present  Chronic midline low back pain without sciatica     Problem List Patient Active Problem List   Diagnosis Date Noted  . BMI 39.0-39.9,adult 09/13/2020  . Lumbar  radiculitis 09/13/2020  . Myalgia due to statin 12/06/2018  . Prediabetes 12/05/2018  . Vitamin D deficiency 11/01/2018  . Insomnia 01/16/2017  . Morbid obesity ([place] 12/13/2016  . Hyperlipidemia   . Hypertension   . Chronic pain syndrome   . Lumbago   . Spinal stenosis   . Displacement of lumbar intervertebral disc without myelopathy 03/11/2013   9:10 AM, 01/19/21 AEtta Grandchild PT, DPT Physical Therapist - [phone](Office)   Buccola,Allan C 01/19/2021, 8:59 AM  [facility] [address] [place] [address]Phone: [phone]  Fax:  [phone]  Name: Kiara Parker MRN: [id number] Date of Birth: 12-12-1950

## 2021-02-01 ENCOUNTER — Ambulatory Visit: Payer: Medicare HMO | Attending: Nurse Practitioner

## 2021-02-01 DIAGNOSIS — G8929 Other chronic pain: Secondary | ICD-10-CM | POA: Insufficient documentation

## 2021-02-01 DIAGNOSIS — M48061 Spinal stenosis, lumbar region without neurogenic claudication: Secondary | ICD-10-CM | POA: Insufficient documentation

## 2021-02-01 DIAGNOSIS — M545 Low back pain, unspecified: Secondary | ICD-10-CM | POA: Insufficient documentation

## 2021-02-02 ENCOUNTER — Other Ambulatory Visit: Payer: Self-pay

## 2021-02-02 ENCOUNTER — Ambulatory Visit: Payer: Medicare HMO

## 2021-02-02 DIAGNOSIS — G8929 Other chronic pain: Secondary | ICD-10-CM | POA: Diagnosis not present

## 2021-02-02 DIAGNOSIS — M48061 Spinal stenosis, lumbar region without neurogenic claudication: Secondary | ICD-10-CM | POA: Diagnosis not present

## 2021-02-02 DIAGNOSIS — M545 Low back pain, unspecified: Secondary | ICD-10-CM | POA: Diagnosis not present

## 2021-02-02 NOTE — Therapy (Signed)
[place] [address], [place], [address] Phone: [phone]   Fax:  [phone]  Physical Therapy Treatment  Patient Details  Name: Kiara Parker MRN: [id number] Date of Birth: December 17, 1950 Referring Provider (PT): Marnee Guarneri NP (Dr. Jeanie Cooks)   Encounter Date: 02/02/2021   PT End of Session - 02/02/21 0939    Visit Number 13    Number of Visits 17    Date for PT Re-Evaluation 02/22/21    Authorization Type [employer]    Authorization Time Period 11/30/20-02/22/21    PT Start Time 0939   Pt arrived late   PT Stop Time 1021    PT Time Calculation (min) 42 min    Activity Tolerance Patient tolerated treatment well;No increased pain;Patient limited by fatigue    Behavior During Therapy [facility] for tasks assessed/performed           Past Medical History:  Diagnosis Date  . Chronic pain syndrome   . Hyperlipidemia   . Hypertension   . Lumbago   . Spinal stenosis     Past Surgical History:  Procedure Laterality Date  . COLONOSCOPY WITH PROPOFOL N/A 11/27/2018   Procedure: COLONOSCOPY WITH PROPOFOL;  Surgeon: Jonathon Bellows, MD;  Location: [facility] ENDOSCOPY;  Service: Gastroenterology;  Laterality: N/A;  . TUBAL LIGATION      There were no vitals filed for this visit.   Subjective Assessment - 02/02/21 0940    Subjective Back is a little sore this morning. 3/10 currently.  4/10 low back pain at most for the past 7 days.    Pertinent History Chronic progressive lumbar spine disease, history of injections spinal injections, Years of back pain, followed by [facility] for 7+ years, but in the past 6 months now tolerates only ~5 minutes of standing, ~5 minutes of walking. Recent radiacular left lateral thigh pain now abated after epidural injection.    Currently in Pain? Yes    Pain Score 3                                      PT Education - 02/02/21 1016     Education Details ther-ex    Person(s) Educated Patient    Methods Explanation;Demonstration;Tactile cues;Verbal cues    Comprehension Returned demonstration;Verbalized understanding           Objectives  R lumbar paraspinal muscle tension palpated   MedbridgeAccess Code [email or web address]  Therapeutic exercise  Hooklying hip extension with leg straight   R 10x  L 10x  R S/L position (to correct R lateral shift posture)   R S/L clamshell 10x3  Seated manually resisted L lateral shift isometrics to promote more neutral posture, PT manual resistance 10x4  Standing squat to chair 5x2. L patella discomfort.   Seated B scapular retraction to promote thoracic extension 10x2     Improved exercise technique, movement at target joints, use of target muscles after mod verbal, visual, tactile cues.  Min to mod cues to not hold breath with exercises   Manual therapy  Supine STM hip flexors to decrease tension R and L   R S/LSTM R thoracolumbar paraspinal muscle to decrease tension       Response to treatment No sitting back pain in sitting and gait after treatment.     Clinical impression Continued working on decreasing lateral shift posture, improving trunk and glute strength  to decrease stress to low back. Worked on decreasing hip flexor muscle tension to decrease extension stress to low back. Pt tolerated session well without aggravation of symptoms. Pt will benefit from continued skilled physical therapy services to decrease pain, improve strength and function.         PT Short Term Goals - 01/12/21 1919      PT SHORT TERM GOAL #1   Title After 4 weeks pt to reports improved tolerance to walking>10 minutes with use of RW or 4WW to help manage back pain.    Baseline ~5 minutes; pt currently able to ambulate without AD (01/12/2021)    Time 4    Period Weeks    Status Achieved    Target Date 12/29/20             PT Long Term Goals -  01/12/21 1920      PT LONG TERM GOAL #1   Title After 8 weeks pt to demonstrate FOTO score improvement >10 points to show improved functional ability.    Baseline to be performed at visit 2; Lumbar FOTO 36 (12/02/2020); FOTO 42 (01/12/2021)    Time 8    Period Weeks    Status Partially Met    Target Date 01/26/21      PT LONG TERM GOAL #2   Title After 8 weeks pt to demonstrate 6MWT >841f c LRAD    Baseline Pt able to ambulate 1500 ft without AD (01/05/2021)    Time 8    Period Weeks    Status Achieved    Target Date 01/26/21      PT LONG TERM GOAL #3   Title Pt to demonstrate improved 5xSTS <13second to demonstrate improved hip strength and trunk control    Baseline 16 sec; 10 seconds (01/05/2021)    Time 12    Period Weeks    Status Achieved    Target Date 01/26/21                 Plan - 02/02/21 0938    Clinical Impression Statement Continued working on decreasing lateral shift posture, improving trunk and glute strength to decrease stress to low back. Worked on decreasing hip flexor muscle tension to decrease extension stress to low back. Pt tolerated session well without aggravation of symptoms. Pt will benefit from continued skilled physical therapy services to decrease pain, improve strength and function.    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;Shop    Stability/Clinical Decision Making Stable/Uncomplicated    Rehab Potential Fair    PT Frequency 2x / week    PT Duration 12 weeks    PT Treatment/Interventions ADLs/Self Care Home Management;Moist Heat;Electrical Stimulation;DME Instruction;Gait training;Therapeutic exercise;Therapeutic activities;Stair training;Patient/family education    PT Next Visit Plan Continue with current POC    PT Home Exercise Plan No  updates 4/27    Consulted and Agree with Plan of Care Patient           Patient will benefit from skilled therapeutic intervention in order to improve the following deficits and impairments:  Abnormal gait,Difficulty walking,Decreased strength,Postural dysfunction,Improper body mechanics,Impaired flexibility  Visit Diagnosis: Spinal stenosis of lumbar region, unspecified whether neurogenic claudication present  Chronic midline low back pain without sciatica     Problem List Patient Active Problem List   Diagnosis Date Noted  . BMI 39.0-39.9,adult 09/13/2020  . Lumbar radiculitis 09/13/2020  . Myalgia  due to statin 12/06/2018  . Prediabetes 12/05/2018  . Vitamin D deficiency 11/01/2018  . Insomnia 01/16/2017  . Morbid obesity ([place]) 12/13/2016  . Hyperlipidemia   . Hypertension   . Chronic pain syndrome   . Lumbago   . Spinal stenosis   . Displacement of lumbar intervertebral disc without myelopathy 03/11/2013    Joneen Boers PT, DPT   02/02/2021, 10:22 AM  [place] [facility] [address] [place], [address] Phone: [phone]   Fax:  [phone]  Name: Kiara Parker MRN: [id number] Date of Birth: [birth year]

## 2021-02-08 ENCOUNTER — Ambulatory Visit: Payer: Medicare HMO

## 2021-02-08 ENCOUNTER — Other Ambulatory Visit: Payer: Self-pay

## 2021-02-08 DIAGNOSIS — M48061 Spinal stenosis, lumbar region without neurogenic claudication: Secondary | ICD-10-CM

## 2021-02-08 DIAGNOSIS — G8929 Other chronic pain: Secondary | ICD-10-CM | POA: Diagnosis not present

## 2021-02-08 DIAGNOSIS — M545 Low back pain, unspecified: Secondary | ICD-10-CM | POA: Diagnosis not present

## 2021-02-08 NOTE — Therapy (Signed)
[place] [facility] [address] [place], [address] Phone: [phone]   Fax:  [phone]  Physical Therapy Treatment  Patient Details  Name: Kiara Parker MRN: [id number] Date of Birth: April 02, 1951 Referring Provider (PT): Marnee Guarneri NP (Dr. Jeanie Cooks)   Encounter Date: 02/08/2021   PT End of Session - 02/08/21 0826    Number of Visits 17    Date for PT Re-Evaluation 02/22/21    Authorization Type [employer]    Authorization Time Period 11/30/20-02/22/21    PT Start Time 0827    PT Stop Time 0907    PT Time Calculation (min) 40 min    Activity Tolerance Patient tolerated treatment well;No increased pain;Patient limited by fatigue    Behavior During Therapy [facility] for tasks assessed/performed           Past Medical History:  Diagnosis Date  . Chronic pain syndrome   . Hyperlipidemia   . Hypertension   . Lumbago   . Spinal stenosis     Past Surgical History:  Procedure Laterality Date  . COLONOSCOPY WITH PROPOFOL N/A 11/27/2018   Procedure: COLONOSCOPY WITH PROPOFOL;  Surgeon: Jonathon Bellows, MD;  Location: [facility] ENDOSCOPY;  Service: Gastroenterology;  Laterality: N/A;  . TUBAL LIGATION      There were no vitals filed for this visit.   Subjective Assessment - 02/08/21 0828    Subjective Back hurts a little today, central low back. Does not know if it was too much walking at the [facility]. 4/10 low back pain currently. Not feeling great today.    Pertinent History Chronic progressive lumbar spine disease, history of injections spinal injections, Years of back pain, followed by [facility] for 7+ years, but in the past 6 months now tolerates only ~5 minutes of standing, ~5 minutes of walking. Recent radiacular left lateral thigh pain now abated after epidural injection.    Currently in Pain? Yes    Pain Score 4                                      PT Education -  02/08/21 0833    Education Details ther-ex    Person(s) Educated Patient    Methods Explanation;Demonstration;Tactile cues;Verbal cues    Comprehension Returned demonstration;Verbalized understanding           Objectives  R lumbar paraspinal muscle tension palpated   MedbridgeAccess Code [email or web address]  Therapeutic exercise  R S/L position (to correct R lateral shift posture)  R S/L L hip abduction10x3. PT assist to stabilize pelvis  R S/L clamshell 10x3 PT assist to stabilize pelvis  R S/L transversus abdominis contraction 10x3  Hooklying hip extension with leg straight              R 10x3             L 10x3  Seated manually resisted L lateral shift isometrics to promote more neutral posture, PT manual resistance 10x4  Seated B scapular retraction to promote thoracic extension 10x3     Improved exercise technique, movement at target joints, use of target muscles after mod verbal, visual, tactile cues.  Min to mod cues to not hold breath with exercises   Manual therapy  R S/LSTM R thoracolumbar paraspinal muscle to decrease tension     Response to treatment Pt tolerated session well without aggravation of symptoms. No  back pain with standing and with gait after session.     Clinical impression Continued working on decreasing R lateral shift posture, decreasing R lumbar paraspinal muscle tension, improving trunk and glute strength to help decrease stress to low back. Decreased pain with decreasing R lateral shift posture. Pt will benefit from continued skilled physical therapy services to decrease pain, improve strength and function. No pain after session.        PT Short Term Goals - 01/12/21 1919      PT SHORT TERM GOAL #1   Title After 4 weeks pt to reports improved tolerance to walking>10 minutes with use of RW or 4WW to help manage back pain.    Baseline ~5 minutes; pt currently able to ambulate without AD (01/12/2021)     Time 4    Period Weeks    Status Achieved    Target Date 12/29/20             PT Long Term Goals - 01/12/21 1920      PT LONG TERM GOAL #1   Title After 8 weeks pt to demonstrate FOTO score improvement >10 points to show improved functional ability.    Baseline to be performed at visit 2; Lumbar FOTO 36 (12/02/2020); FOTO 42 (01/12/2021)    Time 8    Period Weeks    Status Partially Met    Target Date 01/26/21      PT LONG TERM GOAL #2   Title After 8 weeks pt to demonstrate 6MWT >851f c LRAD    Baseline Pt able to ambulate 1500 ft without AD (01/05/2021)    Time 8    Period Weeks    Status Achieved    Target Date 01/26/21      PT LONG TERM GOAL #3   Title Pt to demonstrate improved 5xSTS <13second to demonstrate improved hip strength and trunk control    Baseline 16 sec; 10 seconds (01/05/2021)    Time 12    Period Weeks    Status Achieved    Target Date 01/26/21                 Plan - 02/08/21 0834    Clinical Impression Statement Continued working on decreasing R lateral shift posture, decreasing R lumbar paraspinal muscle tension, improving trunk and glute strength to help decrease stress to low back. Decreased pain with decreasing R lateral shift posture. Pt will benefit from continued skilled physical therapy services to decrease pain, improve strength and function. No pain after session.    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;Shop    Stability/Clinical Decision Making Stable/Uncomplicated    Rehab Potential Fair    PT Frequency 2x / week    PT Duration 12 weeks    PT Treatment/Interventions ADLs/Self Care Home Management;Moist Heat;Electrical Stimulation;DME Instruction;Gait training;Therapeutic exercise;Therapeutic activities;Stair training;Patient/family  education    PT Next Visit Plan Continue with current POC    PT Home Exercise Plan No updates 4/27    Consulted and Agree with Plan of Care Patient           Patient will benefit from skilled therapeutic intervention in order to improve the following deficits and impairments:  Abnormal gait,Difficulty walking,Decreased strength,Postural dysfunction,Improper body mechanics,Impaired flexibility  Visit Diagnosis: Spinal stenosis of lumbar region, unspecified whether neurogenic claudication present  Chronic midline low back pain without sciatica     Problem List  Patient Active Problem List   Diagnosis Date Noted  . BMI 39.0-39.9,adult 09/13/2020  . Lumbar radiculitis 09/13/2020  . Myalgia due to statin 12/06/2018  . Prediabetes 12/05/2018  . Vitamin D deficiency 11/01/2018  . Insomnia 01/16/2017  . Morbid obesity ([place]) 12/13/2016  . Hyperlipidemia   . Hypertension   . Chronic pain syndrome   . Lumbago   . Spinal stenosis   . Displacement of lumbar intervertebral disc without myelopathy 03/11/2013    Joneen Boers PT, DPT   02/08/2021, 9:08 AM  [place] [facility] [address], [place], [address] Phone: [phone]   Fax:  [phone]  Name: Kiara Parker MRN: [id number] Date of Birth: [birth year]

## 2021-02-10 ENCOUNTER — Ambulatory Visit: Payer: Medicare HMO

## 2021-02-10 ENCOUNTER — Other Ambulatory Visit: Payer: Self-pay

## 2021-02-10 DIAGNOSIS — M48061 Spinal stenosis, lumbar region without neurogenic claudication: Secondary | ICD-10-CM

## 2021-02-10 DIAGNOSIS — M545 Low back pain, unspecified: Secondary | ICD-10-CM

## 2021-02-10 DIAGNOSIS — G8929 Other chronic pain: Secondary | ICD-10-CM | POA: Diagnosis not present

## 2021-02-10 NOTE — Therapy (Signed)
[place] [facility] [address], [place], [address] Phone: [phone]   Fax:  [phone]  Physical Therapy Treatment  Patient Details  Name: Kiara Parker MRN: [id number] Date of Birth: Jul 25, 1951 Referring Provider (PT): Marnee Guarneri NP (Dr. Jeanie Cooks)   Encounter Date: 02/10/2021   PT End of Session - 02/10/21 0811    Visit Number 15    Number of Visits 17    Date for PT Re-Evaluation 02/22/21    Authorization Type [employer]    Authorization Time Period 11/30/20-02/22/21    PT Start Time 0811    PT Stop Time 3903    PT Time Calculation (min) 33 min    Activity Tolerance Patient tolerated treatment well;No increased pain;Patient limited by fatigue    Behavior During Therapy [facility] for tasks assessed/performed           Past Medical History:  Diagnosis Date  . Chronic pain syndrome   . Hyperlipidemia   . Hypertension   . Lumbago   . Spinal stenosis     Past Surgical History:  Procedure Laterality Date  . COLONOSCOPY WITH PROPOFOL N/A 11/27/2018   Procedure: COLONOSCOPY WITH PROPOFOL;  Surgeon: Jonathon Bellows, MD;  Location: [facility] ENDOSCOPY;  Service: Gastroenterology;  Laterality: N/A;  . TUBAL LIGATION      There were no vitals filed for this visit.   Subjective Assessment - 02/10/21 0812    Subjective Back is better. No pain currently    Pertinent History Chronic progressive lumbar spine disease, history of injections spinal injections, Years of back pain, followed by [facility] for 7+ years, but in the past 6 months now tolerates only ~5 minutes of standing, ~5 minutes of walking. Recent radiacular left lateral thigh pain now abated after epidural injection.    Currently in Pain? No/denies                                     PT Education - 02/10/21 0815    Education Details ther-ex    Person(s) Educated Patient    Methods Explanation;Demonstration;Tactile  cues;Verbal cues    Comprehension Returned demonstration;Verbalized understanding          Objectives  R lumbar paraspinal muscle tension palpated   MedbridgeAccess Code [email or web address]  Therapeutic exercise  R S/L position (to correct R lateral shift posture)  R S/L L hip abduction10x3. PT assist to stabilize pelvis  R S/L clamshell 10x3 PT assist to stabilize pelvis  R S/L transversus abdominis contraction 10x3   Hooklying hip extension with leg straight  R 10x3 L 10x3   Seated manually resisted L lateral shift isometrics to promote more neutral posture, PT manual resistance 10x4  Pt hand at L lumbar area laterally during last 2 sets    Improved exercise technique, movement at target joints, use of target muscles after mod verbal, visual, tactile cues.  Min to mod cues to not hold breath with exercises   Manual therapy  R S/LSTM R thoracolumbar paraspinal muscle to decrease tension     Response to treatment Pt tolerated session well without aggravation of symptoms.  Clinical impression Very good carry over of decreased back pain from previous session with reports of 0/10 starting pain level this morning. Continued working on decreasing R lateral shift posture, improving glute and trunk strength to decrease muscle tension and stress to low back. Pt tolerated session  well without aggravation of symptoms. Pt will benefit from continued skilled physical therapy services to decrease pain, improve strength and function.         PT Short Term Goals - 01/12/21 1919      PT SHORT TERM GOAL #1   Title After 4 weeks pt to reports improved tolerance to walking>10 minutes with use of RW or 4WW to help manage back pain.    Baseline ~5 minutes; pt currently able to ambulate without AD (01/12/2021)    Time 4    Period Weeks    Status Achieved    Target Date 12/29/20             PT Long Term Goals - 01/12/21 1920       PT LONG TERM GOAL #1   Title After 8 weeks pt to demonstrate FOTO score improvement >10 points to show improved functional ability.    Baseline to be performed at visit 2; Lumbar FOTO 36 (12/02/2020); FOTO 42 (01/12/2021)    Time 8    Period Weeks    Status Partially Met    Target Date 01/26/21      PT LONG TERM GOAL #2   Title After 8 weeks pt to demonstrate 6MWT >855f c LRAD    Baseline Pt able to ambulate 1500 ft without AD (01/05/2021)    Time 8    Period Weeks    Status Achieved    Target Date 01/26/21      PT LONG TERM GOAL #3   Title Pt to demonstrate improved 5xSTS <13second to demonstrate improved hip strength and trunk control    Baseline 16 sec; 10 seconds (01/05/2021)    Time 12    Period Weeks    Status Achieved    Target Date 01/26/21                 Plan - 02/10/21 0817    Clinical Impression Statement Very good carry over of decreased back pain from previous session with reports of 0/10 starting pain level this morning. Continued working on decreasing R lateral shift posture, improving glute and trunk strength to decrease muscle tension and stress to low back. Pt tolerated session well without aggravation of symptoms. Pt will benefit from continued skilled physical therapy services to decrease pain, improve strength and function.    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;Shop    Stability/Clinical Decision Making Stable/Uncomplicated    Rehab Potential Fair    PT Frequency 2x / week    PT Duration 12 weeks    PT Treatment/Interventions ADLs/Self Care Home Management;Moist Heat;Electrical Stimulation;DME Instruction;Gait training;Therapeutic exercise;Therapeutic activities;Stair training;Patient/family education    PT Next Visit Plan Continue with current  POC    PT Home Exercise Plan No updates 4/27    Consulted and Agree with Plan of Care Patient           Patient will benefit from skilled therapeutic intervention in order to improve the following deficits and impairments:  Abnormal gait,Difficulty walking,Decreased strength,Postural dysfunction,Improper body mechanics,Impaired flexibility  Visit Diagnosis: Spinal stenosis of lumbar region, unspecified whether neurogenic claudication present  Chronic midline low back pain without sciatica     Problem List Patient Active Problem List   Diagnosis Date Noted  . BMI 39.0-39.9,adult 09/13/2020  . Lumbar radiculitis 09/13/2020  . Myalgia due to statin 12/06/2018  . Prediabetes 12/05/2018  . Vitamin D deficiency  11/01/2018  . Insomnia 01/16/2017  . Morbid obesity ([place]) 12/13/2016  . Hyperlipidemia   . Hypertension   . Chronic pain syndrome   . Lumbago   . Spinal stenosis   . Displacement of lumbar intervertebral disc without myelopathy 03/11/2013    Joneen Boers PT, DPT   02/10/2021, 9:33 AM  [place] [address] [place], [address] Phone: [phone]   Fax:  [phone]  Name: RYELYNN GUEDEA MRN: [id number] Date of Birth: 1951-09-10

## 2021-02-14 ENCOUNTER — Telehealth: Payer: Self-pay

## 2021-02-14 NOTE — Telephone Encounter (Signed)
She would need visit and not sure insurance would cover.  If she is donating, often donation center will let patients know what blood type they have.

## 2021-02-14 NOTE — Telephone Encounter (Signed)
Can patient have this checked via lab visit or does she need an office visit?

## 2021-02-14 NOTE — Telephone Encounter (Signed)
Patient notified

## 2021-02-14 NOTE — Telephone Encounter (Signed)
Copied from [place] [phone]. Topic: General - Other >> Feb 14, 2021 10:33 AM Antonieta Iba C wrote: Reason for CRM: pt called in for a call. Pt is calling in to ask what is her blood type? Pt says that she is going to donate blood to a relative.  CB: [phone]

## 2021-02-15 ENCOUNTER — Ambulatory Visit: Payer: Medicare HMO

## 2021-02-17 ENCOUNTER — Ambulatory Visit: Payer: Medicare HMO

## 2021-02-17 ENCOUNTER — Other Ambulatory Visit: Payer: Self-pay

## 2021-02-17 DIAGNOSIS — G8929 Other chronic pain: Secondary | ICD-10-CM | POA: Diagnosis not present

## 2021-02-17 DIAGNOSIS — M48061 Spinal stenosis, lumbar region without neurogenic claudication: Secondary | ICD-10-CM | POA: Diagnosis not present

## 2021-02-17 DIAGNOSIS — M545 Low back pain, unspecified: Secondary | ICD-10-CM

## 2021-02-17 NOTE — Patient Instructions (Signed)
       Please remember to    BREATHE   throughout your exercises!

## 2021-02-17 NOTE — Therapy (Signed)
[facility] [address], [place], [address] Phone: [phone]   Fax:  [phone]  Physical Therapy Treatment And Discharge Summary  Patient Details  Name: Kiara Parker MRN: [id number] Date of Birth: 04-18-1951 Referring Provider (PT): Marnee Guarneri NP (Dr. Jeanie Cooks)   Encounter Date: 02/17/2021   PT End of Session - 02/17/21 0848    Visit Number 16    Number of Visits 17    Date for PT Re-Evaluation 02/22/21    Authorization Type [employer]    Authorization Time Period 11/30/20-02/22/21    PT Start Time 0848    PT Stop Time 0929    PT Time Calculation (min) 41 min    Activity Tolerance Patient tolerated treatment well;No increased pain    Behavior During Therapy WFL for tasks assessed/performed           Past Medical History:  Diagnosis Date  . Chronic pain syndrome   . Hyperlipidemia   . Hypertension   . Lumbago   . Spinal stenosis     Past Surgical History:  Procedure Laterality Date  . COLONOSCOPY WITH PROPOFOL N/A 11/27/2018   Procedure: COLONOSCOPY WITH PROPOFOL;  Surgeon: Jonathon Bellows, MD;  Location: [facility] ENDOSCOPY;  Service: Gastroenterology;  Laterality: N/A;  . TUBAL LIGATION      There were no vitals filed for this visit.   Subjective Assessment - 02/17/21 0849    Subjective Back has a little bit if a pinch in it this morning. About a 3/10 currently. Yesterday was a very long day. Takes a couple of teenagers shopping. Also did not get much sleep the past 2 nights. 4/10 low back pain at most for the past 7 days because of strawberry picking.  Feels like she can graduate today and continue with her exercises at home.    Pertinent History Chronic progressive lumbar spine disease, history of injections spinal injections, Years of back pain, followed by [facility] for 7+ years, but in the past 6 months now tolerates only ~5 minutes of standing, ~5 minutes of walking.  Recent radiacular left lateral thigh pain now abated after epidural injection.    Currently in Pain? Yes    Pain Score 3                                      PT Education - 02/17/21 0918    Education Details ther-ex, HEP    Person(s) Educated Patient    Methods Explanation;Demonstration;Tactile cues;Verbal cues    Comprehension Returned demonstration;Verbalized understanding           Objectives  R lumbar paraspinal muscle tension palpated   MedbridgeAccess Code [email or web address]  Therapeutic exercise  R S/L position (to correct R lateral shift posture)  R S/L L hip abduction10x3.   R S/L clamshell 10x3  R S/L transversus abdominis contraction 10x3  Hooklying hip extension with leg straight  R 10x3 L 10x3  Seated manually resisted L lateral shift isometrics to promote more neutral posture, PT manual resistance 10x4  Seated manually resisted trunk flexion isometrics 10x4  Seated B scapular retraction 10x    Improved exercise technique, movement at target joints, use of target muscles after mod verbal, visual, tactile cues.  Min to mod cues to not hold breath with exercises   Manual therapy  R S/LSTM R thoracolumbar paraspinal muscle to decrease tension  Response to treatment Pt tolerated session well without aggravation of symptoms.  Clinical impression Pt demonstrates improved functional strength, function, and ability to ambulate more quickly and for longer distance without AD since initial evaluation. Pt has made very good progress with PT towards goals. Skilled physical therapy services discharged with pt continuing with her exercises at home.       PT Short Term Goals - 01/12/21 1919      PT SHORT TERM GOAL #1   Title After 4 weeks pt to reports improved tolerance to walking>10 minutes with use of RW or 4WW to help manage back pain.    Baseline ~5 minutes; pt currently  able to ambulate without AD (01/12/2021)    Time 4    Period Weeks    Status Achieved    Target Date 12/29/20             PT Long Term Goals - 02/17/21 0926      PT LONG TERM GOAL #1   Title After 8 weeks pt to demonstrate FOTO score improvement >10 points to show improved functional ability.    Baseline to be performed at visit 2; Lumbar FOTO 36 (12/02/2020); FOTO 42 (01/12/2021); 53 (02/17/2021)    Time 8    Period Weeks    Status Achieved    Target Date 02/22/21      PT LONG TERM GOAL #2   Title After 8 weeks pt to demonstrate 6MWT >826ft c LRAD    Baseline Pt able to ambulate 1500 ft without AD (01/05/2021)    Time 8    Period Weeks    Status Achieved      PT LONG TERM GOAL #3   Title Pt to demonstrate improved 5xSTS <13second to demonstrate improved hip strength and trunk control    Baseline 16 sec; 10 seconds (01/05/2021)    Time 12    Period Weeks    Status Achieved                    Plan - 02/17/21 4944    Clinical Impression Statement Pt demonstrates improved functional strength, function, and ability to ambulate more quickly and for longer distance without AD since initial evaluation. Pt has made very good progress with PT towards goals. Skilled physical therapy services discharged with pt continuing with her exercises at home.    Personal Factors and Comorbidities Fitness;Past/Current Experience    PT Treatment/Interventions Therapeutic exercise;Therapeutic activities;Patient/family education;Manual techniques    PT Next Visit Plan Continue with [place] Access Code [email or web address]    Consulted and Agree with Plan of Care Patient           Patient will benefit from skilled therapeutic intervention in order to improve the following deficits and impairments:  Abnormal gait,Difficulty walking,Decreased strength,Postural dysfunction,Improper body mechanics,Impaired flexibility  Visit Diagnosis: Spinal stenosis of lumbar region,  unspecified whether neurogenic claudication present  Chronic midline low back pain without sciatica     Problem List Patient Active Problem List   Diagnosis Date Noted  . BMI 39.0-39.9,adult 09/13/2020  . Lumbar radiculitis 09/13/2020  . Myalgia due to statin 12/06/2018  . Prediabetes 12/05/2018  . Vitamin D deficiency 11/01/2018  . Insomnia 01/16/2017  . Morbid obesity ([place]) 12/13/2016  . Hyperlipidemia   . Hypertension   . Chronic pain syndrome   . Lumbago   . Spinal stenosis   . Displacement of lumbar intervertebral disc without myelopathy 03/11/2013  Thank you for your referral.  Joneen Boers PT, DPT  02/17/2021, 10:30 AM  [facility] [address], [place], [address] Phone: [phone]   Fax:  [phone]  Name: Kiara Parker MRN: [id number] Date of Birth: 1950/11/22

## 2021-03-18 ENCOUNTER — Emergency Department
Admission: EM | Admit: 2021-03-18 | Discharge: 2021-03-18 | Disposition: A | Discharge status: Home / Self Care | Payer: Medicare HMO | Attending: Emergency Medicine | Admitting: Emergency Medicine

## 2021-03-18 ENCOUNTER — Emergency Department: Payer: Medicare HMO

## 2021-03-18 ENCOUNTER — Other Ambulatory Visit: Payer: Self-pay

## 2021-03-18 DIAGNOSIS — Z79899 Other long term (current) drug therapy: Secondary | ICD-10-CM | POA: Diagnosis not present

## 2021-03-18 DIAGNOSIS — Z7982 Long term (current) use of aspirin: Secondary | ICD-10-CM | POA: Insufficient documentation

## 2021-03-18 DIAGNOSIS — I10 Essential (primary) hypertension: Secondary | ICD-10-CM | POA: Insufficient documentation

## 2021-03-18 DIAGNOSIS — W06XXXA Fall from bed, initial encounter: Secondary | ICD-10-CM | POA: Insufficient documentation

## 2021-03-18 DIAGNOSIS — R519 Headache, unspecified: Secondary | ICD-10-CM

## 2021-03-18 DIAGNOSIS — S060X0A Concussion without loss of consciousness, initial encounter: Secondary | ICD-10-CM | POA: Diagnosis not present

## 2021-03-18 DIAGNOSIS — S0990XA Unspecified injury of head, initial encounter: Secondary | ICD-10-CM | POA: Diagnosis present

## 2021-03-18 NOTE — ED Triage Notes (Signed)
Pt states she rolled out of bed last night and hit her head on the dresser, states she is concerned due to lossing a friend do to a head injury. Is not on blood thinners, c/o having a HA, denies any other injury

## 2021-03-18 NOTE — Discharge Instructions (Addendum)
No heavy lifting or exercise for the next 48 hours  Then, slowly return to normal activity as able

## 2021-03-18 NOTE — ED Provider Notes (Signed)
[facility] Emergency Department Provider Note  ____________________________________________   Event Date/Time   First MD Initiated Contact with Patient 03/18/21 1427     (approximate)  I have reviewed the triage vital signs and the nursing notes.   HISTORY  Chief Complaint Headache    HPI Kiara Parker is a 70 y.o. female with history of hypertension, hyperlipidemia, here with headache.  The patient believes that at around 5 AM this morning, she fell, onto bed, and struck her head on a nightstand.  She reports immediate onset of pain.  She woke up as she was falling.  She was able to get herself back up and has otherwise been well.  However, she has had ongoing headache and has felt slightly "out of it" and confused.  She does not take blood thinners.  She does have a family history of recent subdural and death from head trauma, so she presents for evaluation.  No neck pain or stiffness.  No upper extremity numbness or weakness.   Past Medical History:  Diagnosis Date   Chronic pain syndrome    Hyperlipidemia    Hypertension    Lumbago    Spinal stenosis     Patient Active Problem List   Diagnosis Date Noted   BMI 39.0-39.9,adult 09/13/2020   Lumbar radiculitis 09/13/2020   Myalgia due to statin 12/06/2018   Prediabetes 12/05/2018   Vitamin D deficiency 11/01/2018   Insomnia 01/16/2017   Morbid obesity ([place]) 12/13/2016   Hyperlipidemia    Hypertension    Chronic pain syndrome    Lumbago    Spinal stenosis    Displacement of lumbar intervertebral disc without myelopathy 03/11/2013    Past Surgical History:  Procedure Laterality Date   COLONOSCOPY WITH PROPOFOL N/A 11/27/2018   Procedure: COLONOSCOPY WITH PROPOFOL;  Surgeon: Jonathon Bellows, MD;  Location: [facility] ENDOSCOPY;  Service: Gastroenterology;  Laterality: N/A;   TUBAL LIGATION      Prior to Admission medications   Medication Sig Start Date End Date Taking? Authorizing Provider   ASPIRIN 81 PO Take 1 tablet by mouth daily.    [provider]  baclofen (LIORESAL) 10 MG tablet Take 0.5 tablets (5 mg total) by mouth at bedtime. 12/28/20   Cannady, Henrine Screws T, NP  gabapentin (NEURONTIN) 300 MG capsule TAKE 1 CAPSULE BY MOUTH THREE TIMES A DAY 12/28/20   Cannady, [name] T, NP  hydrochlorothiazide (HYDRODIURIL) 12.5 MG tablet Take 1 tablet (12.5 mg total) by mouth daily. 12/28/20   Cannady, Henrine Screws T, NP  meloxicam (MOBIC) 15 MG tablet Take 1 tablet (15 mg total) by mouth daily. 12/28/20   Cannady, Henrine Screws T, NP  rosuvastatin (CRESTOR) 5 MG tablet TAKE 1 TABLET (5 MG TOTAL) BY MOUTH 3 (THREE) TIMES A WEEK 12/28/20   Cannady, Henrine Screws T, NP    Allergies Atorvastatin, Clavulanic acid, Erythromycin, Penicillins, Pravastatin, and Zithromax [azithromycin]  Family History  Problem Relation Age of Onset   Cancer Mother        breast   Arthritis Mother    Osteoporosis Mother    Breast cancer Mother    Heart disease Father    Lung disease Father    Cancer Sister        lymphoma   Cancer Maternal Grandmother        lymphoma   Multiple sclerosis Sister     Social History Social History   Tobacco Use   Smoking status: Never   Smokeless tobacco: Never  Vaping Use  Vaping Use: Never used  Substance Use Topics   Alcohol use: No   Drug use: No    Review of Systems  Review of Systems  Constitutional:  Negative for fatigue and fever.  HENT:  Negative for congestion and sore throat.   Eyes:  Negative for visual disturbance.  Respiratory:  Negative for cough and shortness of breath.   Cardiovascular:  Negative for chest pain.  Gastrointestinal:  Negative for abdominal pain, diarrhea, nausea and vomiting.  Genitourinary:  Negative for flank pain.  Musculoskeletal:  Negative for back pain and neck pain.  Skin:  Negative for rash and wound.  Neurological:  Positive for headaches. Negative for weakness.  All other systems reviewed and are negative.    ____________________________________________  PHYSICAL EXAM:      VITAL SIGNS: ED Triage Vitals [03/18/21 1307]  Enc Vitals Group     BP (!) 153/76     Pulse Rate 94     Resp 20     Temp 98.5 F (36.9 C)     Temp Source Oral     SpO2 91 %     Weight 220 lb (99.8 kg)     Height 5\' 3"  (1.6 m)     Head Circumference      Peak Flow      Pain Score 3     Pain Loc      Pain Edu?      Excl. in [place]?      Physical Exam Vitals and nursing note reviewed.  Constitutional:      General: She is not in acute distress.    Appearance: She is well-developed.  HENT:     Head: Normocephalic and atraumatic.  Eyes:     Conjunctiva/sclera: Conjunctivae normal.  Cardiovascular:     Rate and Rhythm: Normal rate and regular rhythm.     Heart sounds: Normal heart sounds. No murmur heard.   No friction rub.  Pulmonary:     Effort: Pulmonary effort is normal. No respiratory distress.     Breath sounds: Normal breath sounds. No wheezing or rales.  Abdominal:     General: There is no distension.     Palpations: Abdomen is soft.     Tenderness: There is no abdominal tenderness.  Musculoskeletal:     Cervical back: Neck supple.  Skin:    General: Skin is warm.     Capillary Refill: Capillary refill takes less than 2 seconds.  Neurological:     Mental Status: She is alert and oriented to person, place, and time.     Motor: No abnormal muscle tone.     Comments: Neurological Exam:  Mental Status: Alert and oriented to person, place, and time. Attention and concentration normal. Speech clear. Recent memory is intact. Cranial Nerves: Visual fields grossly intact. EOMI and PERRLA. No nystagmus noted. Facial sensation intact at forehead, maxillary cheek, and chin/mandible bilaterally. No facial asymmetry or weakness. Hearing grossly normal. Uvula is midline, and palate elevates symmetrically. Normal SCM and trapezius strength. Tongue midline without fasciculations. Motor: Muscle strength 5/5 in  proximal and distal UE and LE bilaterally. No pronator drift. Muscle tone normal.  Sensation: Intact to light touch in upper and lower extremities distally bilaterally.  Gait: Normal without ataxia. Coordination: Normal FTN bilaterally.          ____________________________________________   LABS (all labs ordered are listed, but only abnormal results are displayed)  Labs Reviewed - No data to display  ____________________________________________  EKG:  ________________________________________  RADIOLOGY All imaging, including plain films, CT scans, and ultrasounds, independently reviewed by me, and interpretations confirmed via formal radiology reads.  ED MD interpretation:   CT head: No active disease  Official radiology report(s): CT Head Wo Contrast  Result Date: 03/18/2021 CLINICAL DATA:  Headache, hit head on nightstand EXAM: CT HEAD WITHOUT CONTRAST TECHNIQUE: Contiguous axial images were obtained from the base of the skull through the vertex without intravenous contrast. COMPARISON:  None. FINDINGS: Brain: There is no acute intracranial hemorrhage, mass effect, or edema. Gray-white differentiation is preserved. There is no extra-axial fluid collection. Ventricles and sulci are within normal limits in size and configuration. Vascular: There is mild atherosclerotic calcification at the skull base. Skull: Calvarium is unremarkable. Sinuses/Orbits: No acute finding. Other: None. IMPRESSION: No evidence of acute intracranial injury. Electronically Signed   By: Macy Mis M.D.   On: 03/18/2021 14:11    ____________________________________________  PROCEDURES   Procedure(s) performed (including Critical Care):  Procedures  ____________________________________________  INITIAL IMPRESSION / MDM / [place] / ED COURSE  As part of my medical decision making, I reviewed the following data within the [facility] notes reviewed and  incorporated, Old chart reviewed, Notes from prior ED visits, and [place] Controlled Substance Hollis was evaluated in Emergency Department on 03/18/2021 for the symptoms described in the history of present illness. She was evaluated in the context of the global COVID-19 pandemic, which necessitated consideration that the patient might be at risk for infection with the SARS-CoV-2 virus that causes COVID-19. Institutional protocols and algorithms that pertain to the evaluation of patients at risk for COVID-19 are in a state of rapid change based on information released by regulatory bodies including the CDC and federal and state organizations. These policies and algorithms were followed during the patient's care in the ED.  Some ED evaluations and interventions may be delayed as a result of limited staffing during the pandemic.*     Medical Decision Making: 70 year old female here with likely mild concussion in the setting of recent head trauma.  She is not on anticoagulation.  Neurologically, she is intact here with no acute abnormality.  No signs of cervical spine injury.  CT head reviewed and shows no acute abnormality.  She is amatory without difficulty.  Denies any recent fever, chills, or alternative etiology for her reported fogginess.  Will give her concussion precautions, discharged with outpatient follow-up.  ____________________________________________  FINAL CLINICAL IMPRESSION(S) / ED DIAGNOSES  Final diagnoses:  Acute nonintractable headache, unspecified headache type  Concussion without loss of consciousness, initial encounter     MEDICATIONS GIVEN DURING THIS VISIT:  Medications - No data to display   ED Discharge Orders     None        Note:  This document was prepared using Dragon voice recognition software and may include unintentional dictation errors.   Duffy Bruce, MD 03/18/21 [phone]

## 2021-03-25 ENCOUNTER — Encounter: Payer: Self-pay | Admitting: Nurse Practitioner

## 2021-03-25 ENCOUNTER — Other Ambulatory Visit: Payer: Self-pay

## 2021-03-25 ENCOUNTER — Ambulatory Visit (INDEPENDENT_AMBULATORY_CARE_PROVIDER_SITE_OTHER): Payer: Medicare HMO | Admitting: Nurse Practitioner

## 2021-03-25 VITALS — BP 125/57 | HR 69 | Temp 98.1°F

## 2021-03-25 DIAGNOSIS — G44319 Acute post-traumatic headache, not intractable: Secondary | ICD-10-CM

## 2021-03-25 DIAGNOSIS — L989 Disorder of the skin and subcutaneous tissue, unspecified: Secondary | ICD-10-CM | POA: Insufficient documentation

## 2021-03-25 DIAGNOSIS — R519 Headache, unspecified: Secondary | ICD-10-CM | POA: Insufficient documentation

## 2021-03-25 NOTE — Assessment & Plan Note (Signed)
Noted on self exam -- would like referral to dermatology for assessment and removal, referral in today.

## 2021-03-25 NOTE — Progress Notes (Signed)
BP (!) 125/57   Pulse 69   Temp 98.1 F (36.7 C) (Oral)   LMP  (LMP Unknown)   SpO2 95%    Subjective:    Patient ID: Kiara Parker, female    DOB: [birth year], 70 y.o.   MRN: [id number]  HPI: Kiara Parker is a 70 y.o. female  Chief Complaint  Patient presents with   ER Follow Up    Pt states she was seen at the ER for a fall she had a week ago today. States she hit her head on the bedside table    ER FOLLOW UP Was in ER at [facility] on 03/18/21.  Was seen for fall.  Reports she rolled out of bed and hit her head on the night stand, around 5 AM.  She reported immediate headache after that and body pains.  CT scan was performed and no acute findings, no bleeds.  She was concerned as she had a cousin who had a similar fall 3 weeks ago and had head injury.  She reports feeling better since fall and no further falls. Time since discharge: 7 days Hospital/facility: [facility] Diagnosis: Fall Procedures/tests: CT Scan Consultants: none New medications: none Discharge instructions:  follow-up with PCP Status: better   SKIN LESION Has concern about some skin lesions -- one to upper scalp and one to left breast + one to right side panus.  Would like full skin exam and possible removal of lesions.   Duration: months Location: multiple locations Painful: no Itching: no Onset: gradual Context: not changing Associated signs and symptoms:  History of skin cancer: no History of precancerous skin lesions: no Family history of skin cancer: no   Relevant past medical, surgical, family and social history reviewed and updated as indicated. Interim medical history since our last visit reviewed. Allergies and medications reviewed and updated.  Review of Systems  Constitutional:  Negative for activity change, appetite change, diaphoresis, fatigue and fever.  Respiratory:  Negative for cough, chest tightness and shortness of breath.   Cardiovascular:  Negative for chest pain, palpitations and  leg swelling.  Gastrointestinal: Negative.   Neurological: Negative.   Psychiatric/Behavioral: Negative.     Per HPI unless specifically indicated above     Objective:    BP (!) 125/57   Pulse 69   Temp 98.1 F (36.7 C) (Oral)   LMP  (LMP Unknown)   SpO2 95%   Wt Readings from Last 3 Encounters:  03/18/21 220 lb (99.8 kg)  12/28/20 222 lb (100.7 kg)  10/29/20 225 lb 9.6 oz (102.3 kg)    Physical Exam Vitals and nursing note reviewed.  Constitutional:      General: She is awake. She is not in acute distress.    Appearance: She is well-developed and well-groomed. She is obese. She is not ill-appearing or toxic-appearing.  HENT:     Head: Normocephalic.     Right Ear: Hearing normal.     Left Ear: Hearing normal.  Eyes:     General: Lids are normal.        Right eye: No discharge.        Left eye: No discharge.     Conjunctiva/sclera: Conjunctivae normal.     Pupils: Pupils are equal, round, and reactive to light.  Neck:     Vascular: No carotid bruit.  Cardiovascular:     Rate and Rhythm: Normal rate and regular rhythm.     Heart sounds: Normal heart sounds. No murmur heard.  No gallop.  Pulmonary:     Effort: Pulmonary effort is normal. No accessory muscle usage or respiratory distress.     Breath sounds: Normal breath sounds.  Abdominal:     General: Bowel sounds are normal.     Palpations: Abdomen is soft. There is no hepatomegaly or splenomegaly.  Musculoskeletal:     Cervical back: Normal range of motion and neck supple.     Right lower leg: No edema.     Left lower leg: No edema.  Skin:    General: Skin is warm and dry.       Neurological:     Mental Status: She is alert and oriented to person, place, and time.  Psychiatric:        Attention and Perception: Attention normal.        Mood and Affect: Mood normal.        Speech: Speech normal.        Behavior: Behavior normal. Behavior is cooperative.        Thought Content: Thought content normal.     Results for orders placed or performed in visit on 12/28/20  Hepatitis C antibody  Result Value Ref Range   Hep C Virus Ab <0.1 0.0 - 0.9 s/co ratio  CBC with Differential/Platelet  Result Value Ref Range   WBC 7.5 3.4 - 10.8 x10E3/uL   RBC 5.09 3.77 - 5.28 x10E6/uL   Hemoglobin 14.9 11.1 - 15.9 g/dL   Hematocrit 45.2 34.0 - 46.6 %   MCV 89 79 - 97 fL   MCH 29.3 26.6 - 33.0 pg   MCHC 33.0 31.5 - 35.7 g/dL   RDW 13.1 11.7 - 15.4 %   Platelets 322 150 - 450 x10E3/uL   Neutrophils 45 Not Estab. %   Lymphs 40 Not Estab. %   Monocytes 8 Not Estab. %   Eos 6 Not Estab. %   Basos 1 Not Estab. %   Neutrophils Absolute 3.4 1.4 - 7.0 x10E3/uL   Lymphocytes Absolute 3.0 0.7 - 3.1 x10E3/uL   Monocytes Absolute 0.6 0.1 - 0.9 x10E3/uL   EOS (ABSOLUTE) 0.4 0.0 - 0.4 x10E3/uL   Basophils Absolute 0.1 0.0 - 0.2 x10E3/uL   Immature Granulocytes 0 Not Estab. %   Immature Grans (Abs) 0.0 0.0 - 0.1 x10E3/uL  Comprehensive metabolic panel  Result Value Ref Range   Glucose 95 65 - 99 mg/dL   BUN 11 8 - 27 mg/dL   Creatinine, Ser 0.75 0.57 - 1.00 mg/dL   eGFR 87 >59 mL/min/1.73   BUN/Creatinine Ratio 15 12 - 28   Sodium 143 134 - 144 mmol/L   Potassium 4.1 3.5 - 5.2 mmol/L   Chloride 98 96 - 106 mmol/L   CO2 26 20 - 29 mmol/L   Calcium 9.6 8.7 - 10.3 mg/dL   Total Protein 7.1 6.0 - 8.5 g/dL   Albumin 4.3 3.8 - 4.8 g/dL   Globulin, Total 2.8 1.5 - 4.5 g/dL   Albumin/Globulin Ratio 1.5 1.2 - 2.2   Bilirubin Total 0.5 0.0 - 1.2 mg/dL   Alkaline Phosphatase 79 44 - 121 IU/L   AST 29 0 - 40 IU/L   ALT 26 0 - 32 IU/L  Lipid Panel w/o Chol/HDL Ratio  Result Value Ref Range   Cholesterol, Total 188 100 - 199 mg/dL   Triglycerides 186 (H) 0 - 149 mg/dL   HDL 51 >39 mg/dL   VLDL Cholesterol Cal 32 5 - 40 mg/dL   LDL  Chol Calc (NIH) 105 (H) 0 - 99 mg/dL  TSH  Result Value Ref Range   TSH 2.660 0.450 - 4.500 uIU/mL  VITAMIN D 25 Hydroxy (Vit-D Deficiency, Fractures)  Result Value Ref  Range   Vit D, 25-Hydroxy 26.8 (L) 30.0 - 100.0 ng/mL  Hemoglobin A1c  Result Value Ref Range   Hgb A1c MFr Bld 6.0 (H) 4.8 - 5.6 %   Est. average glucose Bld gHb Est-mCnc 126 mg/dL  Microalbumin, Urine Waived  Result Value Ref Range   Microalb, Ur Waived 10 0 - 19 mg/L   Creatinine, Urine Waived 100 10 - 300 mg/dL   Microalb/Creat Ratio <30 <30 mg/g      Assessment & Plan:   Problem List Items Addressed This Visit       Musculoskeletal and Integument   Skin lesions    Noted on self exam -- would like referral to dermatology for assessment and removal, referral in today.       Relevant Orders   Ambulatory referral to Dermatology     Other   Acute headache - Primary    Acute and now improved, CT normal in ER.  She reports no further falls and overall feeling better.  Return in October as scheduled.         Follow up plan: Return for as scheduled in October.

## 2021-03-25 NOTE — Patient Instructions (Signed)
Actinic Keratosis °An actinic keratosis is a precancerous growth on the skin. If there is more than one growth, the condition is called actinic keratoses. Actinic keratoses appear most often on areas of skin that get a lot of sun exposure, including the scalp, face, ears, lips, upper back, forearms, and the backs of the hands. °If left untreated, these growths may develop into a skin cancer called squamous cell carcinoma. It is important to have all these growths checked by a health care provider to determine the best treatment approach. °What are the causes? °Actinic keratoses are caused by getting too much ultraviolet (UV) radiation from the sun or other UV light sources. °What increases the risk? °You are more likely to develop this condition if you: °Have light-colored skin and blue eyes. °Have blond or red hair. °Spend a lot of time in the sun. °Do not protect your skin from the sun when outdoors. °Are an older person. The risk of developing an actinic keratosis increases with age. °What are the signs or symptoms? °Actinic keratoses feel like scaly, rough spots of skin. Symptoms of this condition include growths that may: °Be as small as a pinhead or as big as a quarter. °Itch, hurt, or feel sensitive. °Be skin-colored, light tan, dark tan, pink, or a combination of any of these colors. In most cases, the growths become red. °Have a small piece of pink or gray skin (skin tag) growing from them. °It may be easier to notice actinic keratoses by feeling them, rather than seeing them. Sometimes, actinic keratoses disappear, but many reappear a few days to a few weeks later. °How is this diagnosed? °This condition is usually diagnosed with a physical exam. °A tissue sample may be removed from the actinic keratosis and examined under a microscope (biopsy). °How is this treated? °If needed, this condition may be treated by: °Scraping off the actinic keratosis (curettage). °Freezing the actinic keratosis with liquid  nitrogen (cryosurgery). This causes the growth to eventually fall off the skin. °Applying medicated creams or gels to destroy the cells in the growth. °Applying chemicals to the actinic keratosis to make the outer layers of skin peel off (chemical peel). °Using photodynamic therapy. In this procedure, medicated cream is applied to the actinic keratosis. This cream increases your skin's sensitivity to light. Then, a strong light is aimed at the actinic keratosis to destroy cells in the growth. °Follow these instructions at home: °Skin care °Apply cool, wet cloths (cool compresses) to the affected areas. °Do not scratch your skin. °Check your skin regularly for any growths, especially growths that: °Start to itch or bleed. °Change in size, shape, or color. °Caring for the treated area °Keep the treated area clean and dry as told by your health care provider. °Do not apply any medicine, cream, or lotion to the treated area unless your health care provider tells you to do that. °Do not pick at blisters or try to break them open. This can cause infection and scarring. °If you have red or irritated skin after treatment, follow instructions from your health care provider about how to take care of the treated area. Make sure you: °Wash your hands with soap and water before you change your bandage (dressing). If soap and water are not available, use hand sanitizer. °Change your dressing as told by your health care provider. °If you have red or irritated skin after treatment, check your treated area every day for signs of infection. Check for: °Redness, swelling, or pain. °Fluid or blood. °  Warmth. °Pus or a bad smell. °General instructions °Take or apply over-the-counter and prescription medicines only as told by your health care provider. °Return to your normal activities as told by your health care provider. Ask your health care provider what activities are safe for you. °Have a skin exam done every year by a health care  provider who is a skin specialist (dermatologist). °Keep all follow-up visits as told by your health care provider. This is important. °Lifestyle °Do not use any products that contain nicotine or tobacco, such as cigarettes and e-cigarettes. If you need help quitting, ask your health care provider. °Take steps to protect your skin from the sun. °Try to avoid the sun between 10:00 a.m. and 4:00 p.m. This is when the UV light is the strongest. °Use a sunscreen or sunblock with SPF 30 (sun protection factor 30) or greater. °Apply sunscreen before you are exposed to sunlight and reapply as often as directed by the instructions on the sunscreen container. °Always wear sunglasses that have UV protection, and always wear a hat and clothing to protect your skin from sunlight. °When possible, avoid medicines that increase your sensitivity to sunlight. °Do not use tanning beds or other indoor tanning devices. °Contact a health care provider if: °You notice any changes or new growths on your skin. °You have swelling, pain, or more redness around your treated area. °You have fluid or blood coming from your treated area. °Your treated area feels warm to the touch. °You have pus or a bad smell coming from your treated area. °You have a fever. °You have a blister that becomes large and painful. °Summary °An actinic keratosis is a precancerous growth on the skin. If there is more than one growth, the condition is called actinic keratoses. In some cases, if left untreated, these growths can develop into skin cancer. °Check your skin regularly for any growths, especially growths that start to itch or bleed, or change in size, shape, or color. °Take steps to protect your skin from the sun. °Contact a health care provider if you notice any changes or new growths on your skin. °Keep all follow-up visits as told by your health care provider. This is important. °This information is not intended to replace advice given to you by your  health care provider. Make sure you discuss any questions you have with your health care provider. °Document Revised: 01/22/2018 Document Reviewed: 01/22/2018 °Elsevier Patient Education © 2022 [employer] ° °

## 2021-03-25 NOTE — Assessment & Plan Note (Signed)
Acute and now improved, CT normal in ER.  She reports no further falls and overall feeling better.  Return in October as scheduled.

## 2021-04-11 ENCOUNTER — Telehealth: Payer: Self-pay

## 2021-04-11 NOTE — Telephone Encounter (Signed)
Called pt because she has a referral in for [facility] but she states that they don't have any appts soon. States that [name] was suppose to be looking into to something in the [place] area. Please advise.  Copied from [place] [phone]. Topic: Referral - Request for Referral >> Apr 11, 2021  4:07 PM Erick Blinks wrote: Has patient seen PCP for this complaint? Yes.   *If NO, is insurance requiring patient see PCP for this issue before PCP can refer them? Referral for which specialty: Dermatology  Preferred provider/office: [facility] recommended within [facility] area  Reason for referral: Has moles she needs seen   Best contact: [phone]

## 2021-04-13 NOTE — Telephone Encounter (Signed)
Called pt to relay [name]'s message no answer left vm  

## 2021-04-14 NOTE — Telephone Encounter (Signed)
Pt is calling back checking on the status of dermatologist referrel to [facility] [place]

## 2021-04-14 NOTE — Telephone Encounter (Signed)
Called pt advised of [name]'s message. Pt verbalized understanding

## 2021-04-14 NOTE — Telephone Encounter (Signed)
Called pt to relay below message from [facility] no answer left vm    Copied from [place] [phone]. Topic: General - Other >> Apr 13, 2021  5:14 PM Yvette Rack wrote: Reason for CRM: Pt stated she received a message to call the office. Pt requests call back

## 2021-04-19 ENCOUNTER — Other Ambulatory Visit: Payer: Self-pay

## 2021-04-19 ENCOUNTER — Ambulatory Visit
Admission: RE | Admit: 2021-04-19 | Discharge: 2021-04-19 | Disposition: A | Discharge status: Home / Self Care | Payer: Medicare HMO | Source: Ambulatory Visit | Attending: Nurse Practitioner | Admitting: Nurse Practitioner

## 2021-04-19 DIAGNOSIS — Z1231 Encounter for screening mammogram for malignant neoplasm of breast: Secondary | ICD-10-CM | POA: Diagnosis not present

## 2021-06-29 ENCOUNTER — Telehealth: Payer: Self-pay

## 2021-06-29 ENCOUNTER — Ambulatory Visit: Payer: Medicare HMO | Admitting: Nurse Practitioner

## 2021-06-29 NOTE — Telephone Encounter (Signed)
Copied from [place] [phone]. Topic: Appointment Scheduling - Scheduling Inquiry for Clinic >> Jun 29, 2021 10:12 AM Oneta Rack wrote: Patient states she missed her 9:40am appointment today with PCP because her alarm clock was set for central and she apologizes. Patient would like no show fee waived. Patient [facility] to 07/18/2021 and placed on wait list.

## 2021-07-18 ENCOUNTER — Ambulatory Visit (INDEPENDENT_AMBULATORY_CARE_PROVIDER_SITE_OTHER): Payer: Medicare HMO | Admitting: Nurse Practitioner

## 2021-07-18 ENCOUNTER — Encounter: Payer: Self-pay | Admitting: Nurse Practitioner

## 2021-07-18 ENCOUNTER — Other Ambulatory Visit: Payer: Self-pay

## 2021-07-18 DIAGNOSIS — I10 Essential (primary) hypertension: Secondary | ICD-10-CM | POA: Diagnosis not present

## 2021-07-18 DIAGNOSIS — E782 Mixed hyperlipidemia: Secondary | ICD-10-CM

## 2021-07-18 DIAGNOSIS — E559 Vitamin D deficiency, unspecified: Secondary | ICD-10-CM

## 2021-07-18 DIAGNOSIS — L989 Disorder of the skin and subcutaneous tissue, unspecified: Secondary | ICD-10-CM | POA: Diagnosis not present

## 2021-07-18 DIAGNOSIS — R7303 Prediabetes: Secondary | ICD-10-CM | POA: Diagnosis not present

## 2021-07-18 DIAGNOSIS — R0683 Snoring: Secondary | ICD-10-CM | POA: Diagnosis not present

## 2021-07-18 DIAGNOSIS — M48061 Spinal stenosis, lumbar region without neurogenic claudication: Secondary | ICD-10-CM

## 2021-07-18 NOTE — Assessment & Plan Note (Signed)
Chronic, ongoing.  Continue daily supplement and recheck Vit D level today, adjust dosing as needed.  Recent DEXA with normal findings.

## 2021-07-18 NOTE — Assessment & Plan Note (Signed)
BMI 40.10 with HTN and Prediabetes.  Recommended eating smaller high protein, low fat meals more frequently and exercising 30 mins a day 5 times a week with a goal of 10-15lb weight loss in the next 3 months. Patient voiced their understanding and motivation to adhere to these recommendations.

## 2021-07-18 NOTE — Progress Notes (Addendum)
BP 130/78   Pulse (!) 57   Temp 98.7 F (37.1 C) (Oral)   Wt 226 lb 6.4 oz (102.7 kg)   LMP  (LMP Unknown)   SpO2 96%   BMI 40.10 kg/m    Subjective:    Patient ID: Kiara Parker, female    DOB: 1951-04-05, 70 y.o.   MRN: [id number]  HPI: Kiara Parker is a 70 y.o. female  Chief Complaint  Patient presents with   Hyperlipidemia   Hypertension   Diabetes   Referral    Patient states the last time she was here for a visit. Patient states she wanted to get in to see with Dermatologist office. Patient states she never heard from and she found another office and she cannot be seen until March and states that is a long time to wait. Patient states she does not want to go to ER for a mole.    Back Pain    Patient states when she comes back from [place] she thinks she may need to have another spinal injection as she is having some issues with her back here and there. Patient states she has been having the issue since the last time.    Would like new referral to dermatology, original was placed on 03/25/21 which she reports she never heard from anyone so she looked into another dermatology clinic and can not be seen until March, [place].   Patient is also requesting referral today for sleep study.  She reports significant family history of sleep apnea, having one relative pass away in sleep which they feel was from this being untreated.  She does endorses snoring and waking up with occasional headaches.  HYPERLIPIDEMIA Continues on Crestor 5 MG three days a week, which she tolerates.  Continues on HCTZ 12.5 MG.  She would like to go for sleep study, reports this was recommended in past and is ready to have this performed. Hyperlipidemia status: good compliance Satisfied with current treatment?  no Side effects:  no Medication compliance: good compliance Past cholesterol meds: Lipitor and Pravastatin Supplements: none Aspirin:  yes The 10-year ASCVD risk score (Arnett DK, et  al., 2019) is: 18.8%   Values used to calculate the score:     Age: 29 years     Sex: Female     Is Non-Hispanic African American: No     Diabetic: Yes     Tobacco smoker: No     Systolic Blood Pressure: 433 mmHg     Is BP treated: Yes     HDL Cholesterol: 48 mg/dL     Total Cholesterol: 167 mg/dL  Chest pain:  no Coronary artery disease:  no Family history CAD:  no Family history early CAD:  no    PREDIABETES: A1C in April was 6%.  Diet focused at this time.   She has history of low Vitamin D.  She was treated with high dose Vit D initially, but now taking 1000 units daily.  No recent falls or fractures. Polydipsia/polyuria: no Visual disturbance: no Chest pain: no Paresthesias: no   BACK PAIN Ongoing issue with current acute flare.  Did see neurosurgery at [facility] in past and received injections, last 11/05/20.  Is taking Gabapentin 3 times a day + Baclofen at bedtime + Meloxicam daily.    Reports pain recently increased, after returning from [place], and feels she needs another injection.  Pain currently 4-5/10.     MRI lumbar spine in 2014 noting multilevel  disc "degeneration with particular prominent degeneration and facet hypertrophy at L4-L5 resulting in prominent spinal stenosis at this level".  Bone density on 10/09/19 was normal. Duration: years Mechanism of injury: no trauma Location: L>R, midline and low back + left hip Onset: gradual Severity: improved with epidural recently Aggravating factors: movement, walking and bending -- walking especially Alleviating factors: epidural Status: better Treatments attempted: rest, heat and aleve and medications noted above -- epidural  Relief with NSAIDs?: moderate Nighttime pain: none Paresthesias / decreased sensation:  no Bowel / bladder incontinence:  no Fevers:  no Dysuria / urinary frequency:  no  Relevant past medical, surgical, family and social history reviewed and updated as indicated. Interim  medical history since our last visit reviewed. Allergies and medications reviewed and updated.  Review of Systems  Constitutional:  Negative for activity change, appetite change, diaphoresis, fatigue and fever.  Respiratory:  Negative for cough, chest tightness and shortness of breath.   Cardiovascular:  Negative for chest pain, palpitations and leg swelling.  Gastrointestinal: Negative.   Endocrine: Negative.   Musculoskeletal:  Positive for back pain.  Neurological: Negative.   Psychiatric/Behavioral: Negative.     Per HPI unless specifically indicated above     Objective:    BP 130/78   Pulse (!) 57   Temp 98.7 F (37.1 C) (Oral)   Wt 226 lb 6.4 oz (102.7 kg)   LMP  (LMP Unknown)   SpO2 96%   BMI 40.10 kg/m   Wt Readings from Last 3 Encounters:  07/18/21 226 lb 6.4 oz (102.7 kg)  03/18/21 220 lb (99.8 kg)  12/28/20 222 lb (100.7 kg)    Physical Exam Vitals and nursing note reviewed.  Constitutional:      General: She is awake. She is not in acute distress.    Appearance: She is well-developed. She is obese. She is not ill-appearing.  HENT:     Head: Normocephalic.     Right Ear: Hearing normal.     Left Ear: Hearing normal.  Eyes:     General: Lids are normal.        Right eye: No discharge.        Left eye: No discharge.     Conjunctiva/sclera: Conjunctivae normal.     Pupils: Pupils are equal, round, and reactive to light.  Neck:     Vascular: No carotid bruit.  Cardiovascular:     Rate and Rhythm: Normal rate and regular rhythm.     Heart sounds: Normal heart sounds. No murmur heard.   No gallop.  Pulmonary:     Effort: Pulmonary effort is normal. No accessory muscle usage or respiratory distress.     Breath sounds: Normal breath sounds.  Abdominal:     General: Bowel sounds are normal.     Palpations: Abdomen is soft.  Musculoskeletal:     Cervical back: Normal range of motion and neck supple.     Lumbar back: No swelling, deformity, spasms,  tenderness or bony tenderness. Decreased range of motion. Negative right straight leg raise test and negative left straight leg raise test.     Right lower leg: No edema.     Left lower leg: No edema.     Comments: Antalgic gait.  Skin:    General: Skin is warm and dry.  Neurological:     Mental Status: She is alert and oriented to person, place, and time.  Psychiatric:        Attention and Perception: Attention normal.  Mood and Affect: Mood normal.        Behavior: Behavior normal. Behavior is cooperative.        Thought Content: Thought content normal.        Judgment: Judgment normal.    Results for orders placed or performed in visit on 07/18/21  Bayer DCA Hb A1c Waived  Result Value Ref Range   HB A1C (BAYER DCA - WAIVED) 5.7 (H) 4.8 - 5.6 %  Basic metabolic panel  Result Value Ref Range   Glucose 119 (H) 70 - 99 mg/dL   BUN 10 8 - 27 mg/dL   Creatinine, Ser 0.83 0.57 - 1.00 mg/dL   eGFR 77 >59 mL/min/1.73   BUN/Creatinine Ratio 12 12 - 28   Sodium 142 134 - 144 mmol/L   Potassium 4.2 3.5 - 5.2 mmol/L   Chloride 106 96 - 106 mmol/L   CO2 23 20 - 29 mmol/L   Calcium 9.3 8.7 - 10.3 mg/dL  Lipid Panel w/o Chol/HDL Ratio  Result Value Ref Range   Cholesterol, Total 167 100 - 199 mg/dL   Triglycerides 118 0 - 149 mg/dL   HDL 48 >39 mg/dL   VLDL Cholesterol Cal 21 5 - 40 mg/dL   LDL Chol Calc (NIH) 98 0 - 99 mg/dL  VITAMIN D 25 Hydroxy (Vit-D Deficiency, Fractures)  Result Value Ref Range   Vit D, 25-Hydroxy 23.0 (L) 30.0 - 100.0 ng/mL      Assessment & Plan:   Problem List Items Addressed This Visit       Cardiovascular and Mediastinum   Hypertension    Chronic, stable with BP below goal. Continue low dose HCTZ and adjust as needed. Recommend she monitor BP at least a few mornings a week at home and document.  DASH diet at home.  Labs today: BMP.  Return in 6 months.       Relevant Orders   Basic metabolic panel (Completed)     Musculoskeletal and  Integument   Skin lesions    New referral to dermatology placed.  She is aware there are wait times for dermatology at this time and may have to maintain March appointment.      Relevant Orders   Ambulatory referral to Dermatology     Other   Hyperlipidemia    Chronic, ongoing.  Is tolerating Crestor on 3 day week schedule.  Will continue this and adjust as needed.  Obtain lipid panel today.      Relevant Orders   Lipid Panel w/o Chol/HDL Ratio (Completed)   Spinal stenosis    Chronic, with acute flare.  Continue to collaborate with neurosurgery and PT -- recommend she return to neurosurgery to discuss steroid injection.  Have recommended modest weight loss.  Continue Baclofen and Gabapentin at home, refills up to date.  No red flag symptoms today.  Return in 6 months.      Morbid obesity ([place]) - Primary    BMI 40.10 with HTN and Prediabetes.  Recommended eating smaller high protein, low fat meals more frequently and exercising 30 mins a day 5 times a week with a goal of 10-15lb weight loss in the next 3 months. Patient voiced their understanding and motivation to adhere to these recommendations.       Vitamin D deficiency    Chronic, ongoing.  Continue daily supplement and recheck Vit D level today, adjust dosing as needed.  Recent DEXA with normal findings.      Relevant Orders  VITAMIN D 25 Hydroxy (Vit-D Deficiency, Fractures) (Completed)   Prediabetes    Ongoing with A1C 5.7% today, downward trend, she continues to be between 5.6 to 6.3% on review.  Will adjust plan of care and add medication as needed.  Continue diet focus at this time.  Urine ALB next visit, she was unable to void today.      Relevant Orders   Bayer DCA Hb A1c Waived (Completed)   Other Visit Diagnoses     Snoring       Referral to obtain sleep study due to underlying risk factors - snoring, headaches, obesity, and family history.   Relevant Orders   Ambulatory referral to Sleep Studies         Follow up plan: Return in about 6 months (around 01/16/2022) for Annual physical.

## 2021-07-18 NOTE — Assessment & Plan Note (Signed)
Chronic, ongoing.  Is tolerating Crestor on 3 day week schedule.  Will continue this and adjust as needed.  Obtain lipid panel today. 

## 2021-07-18 NOTE — Assessment & Plan Note (Signed)
Chronic, stable with BP below goal. Continue low dose HCTZ and adjust as needed. Recommend she monitor BP at least a few mornings a week at home and document.  DASH diet at home.  Labs today: BMP.  Return in 6 months.

## 2021-07-18 NOTE — Assessment & Plan Note (Signed)
New referral to dermatology placed.  She is aware there are wait times for dermatology at this time and may have to maintain March appointment.

## 2021-07-18 NOTE — Patient Instructions (Signed)
Prediabetes Eating Plan °Prediabetes is a condition that causes blood sugar (glucose) levels to be higher than normal. This increases the risk for developing type 2 diabetes (type 2 diabetes mellitus). Working with a health care provider or nutrition specialist (dietitian) to make diet and lifestyle changes can help prevent the onset of diabetes. These changes may help you: °Control your blood glucose levels. °Improve your cholesterol levels. °Manage your blood pressure. °What are tips for following this plan? °Reading food labels °Read food labels to check the amount of fat, salt (sodium), and sugar in prepackaged foods. Avoid foods that have: °Saturated fats. °Trans fats. °Added sugars. °Avoid foods that have more than 300 milligrams (mg) of sodium per serving. Limit your sodium intake to less than 2,300 mg each day. °Shopping °Avoid buying pre-made and processed foods. °Avoid buying drinks with added sugar. °Cooking °Cook with olive oil. Do not use butter, lard, or ghee. °Bake, broil, grill, steam, or boil foods. Avoid frying. °Meal planning ° °Work with your dietitian to create an eating plan that is right for you. This may include tracking how many calories you take in each day. Use a food diary, notebook, or mobile application to track what you eat at each meal. °Consider following a Mediterranean diet. This includes: °Eating several servings of fresh fruits and vegetables each day. °Eating fish at least twice a week. °Eating one serving each day of whole grains, beans, nuts, and seeds. °Using olive oil instead of other fats. °Limiting alcohol. °Limiting red meat. °Using nonfat or low-fat dairy products. °Consider following a plant-based diet. This includes dietary choices that focus on eating mostly vegetables and fruit, grains, beans, nuts, and seeds. °If you have high blood pressure, you may need to limit your sodium intake or follow a diet such as the DASH (Dietary Approaches to Stop Hypertension) eating  plan. The DASH diet aims to lower high blood pressure. °Lifestyle °Set weight loss goals with help from your health care team. It is recommended that most people with prediabetes lose 7% of their body weight. °Exercise for at least 30 minutes 5 or more days a week. °Attend a support group or seek support from a mental health counselor. °Take over-the-counter and prescription medicines only as told by your health care provider. °What foods are recommended? °Fruits °Berries. Bananas. Apples. Oranges. Grapes. Papaya. Mango. Pomegranate. Kiwi. Grapefruit. Cherries. °Vegetables °Lettuce. Spinach. Peas. Beets. Cauliflower. Cabbage. Broccoli. Carrots. Tomatoes. Squash. Eggplant. Herbs. Peppers. Onions. Cucumbers. Brussels sprouts. °Grains °Whole grains, such as whole-wheat or whole-grain breads, crackers, cereals, and pasta. Unsweetened oatmeal. Bulgur. Barley. Quinoa. Brown rice. Corn or whole-wheat flour tortillas or taco shells. °Meats and other proteins °Seafood. Poultry without skin. Lean cuts of pork and beef. Tofu. Eggs. Nuts. Beans. °Dairy °Low-fat or fat-free dairy products, such as yogurt, cottage cheese, and cheese. °Beverages °Water. Tea. Coffee. Sugar-free or diet soda. Seltzer water. Low-fat or nonfat milk. Milk alternatives, such as soy or almond milk. °Fats and oils °Olive oil. Canola oil. Sunflower oil. Grapeseed oil. Avocado. Walnuts. °Sweets and desserts °Sugar-free or low-fat pudding. Sugar-free or low-fat ice cream and other frozen treats. °Seasonings and condiments °Herbs. Sodium-free spices. Mustard. Relish. Low-salt, low-sugar ketchup. Low-salt, low-sugar barbecue sauce. Low-fat or fat-free mayonnaise. °The items listed above may not be a complete list of recommended foods and beverages. Contact a dietitian for more information. °What foods are not recommended? °Fruits °Fruits canned with syrup. °Vegetables °Canned vegetables. Frozen vegetables with butter or cream sauce. °Grains °Refined white  flour and flour   products, such as bread, pasta, snack foods, and cereals. °Meats and other proteins °Fatty cuts of meat. Poultry with skin. Breaded or fried meat. Processed meats. °Dairy °Full-fat yogurt, cheese, or milk. °Beverages °Sweetened drinks, such as iced tea and soda. °Fats and oils °Butter. Lard. Ghee. °Sweets and desserts °Baked goods, such as cake, cupcakes, pastries, cookies, and cheesecake. °Seasonings and condiments °Spice mixes with added salt. Ketchup. Barbecue sauce. Mayonnaise. °The items listed above may not be a complete list of foods and beverages that are not recommended. Contact a dietitian for more information. °Where to find more information °American Diabetes Association: www.diabetes.org °Summary °You may need to make diet and lifestyle changes to help prevent the onset of diabetes. These changes can help you control blood sugar, improve cholesterol levels, and manage blood pressure. °Set weight loss goals with help from your health care team. It is recommended that most people with prediabetes lose 7% of their body weight. °Consider following a Mediterranean diet. This includes eating plenty of fresh fruits and vegetables, whole grains, beans, nuts, seeds, fish, and low-fat dairy, and using olive oil instead of other fats. °This information is not intended to replace advice given to you by your health care provider. Make sure you discuss any questions you have with your health care provider. °Document Revised: 12/11/2019 Document Reviewed: 12/11/2019 °Elsevier Patient Education © 2022 [employer] Inc. ° °

## 2021-07-18 NOTE — Assessment & Plan Note (Signed)
Chronic, with acute flare.  Continue to collaborate with neurosurgery and PT -- recommend she return to neurosurgery to discuss steroid injection.  Have recommended modest weight loss.  Continue Baclofen and Gabapentin at home, refills up to date.  No red flag symptoms today.  Return in 6 months.

## 2021-07-18 NOTE — Assessment & Plan Note (Signed)
Ongoing with A1C 5.7% today, downward trend, she continues to be between 5.6 to 6.3% on review.  Will adjust plan of care and add medication as needed.  Continue diet focus at this time.  Urine ALB next visit, she was unable to void today.

## 2021-07-19 LAB — BASIC METABOLIC PANEL
BUN/Creatinine Ratio: 12 (ref 12–28)
BUN: 10 mg/dL (ref 8–27)
CO2: 23 mmol/L (ref 20–29)
Calcium: 9.3 mg/dL (ref 8.7–10.3)
Chloride: 106 mmol/L (ref 96–106)
Creatinine, Ser: 0.83 mg/dL (ref 0.57–1.00)
Glucose: 119 mg/dL — ABNORMAL HIGH (ref 70–99)
Potassium: 4.2 mmol/L (ref 3.5–5.2)
Sodium: 142 mmol/L (ref 134–144)
eGFR: 77 mL/min/{1.73_m2} (ref >59)

## 2021-07-19 LAB — LIPID PANEL W/O CHOL/HDL RATIO
Cholesterol, Total: 167 mg/dL (ref 100–199)
HDL: 48 mg/dL (ref >39)
LDL Chol Calc (NIH): 98 mg/dL (ref 0–99)
Triglycerides: 118 mg/dL (ref 0–149)
VLDL Cholesterol Cal: 21 mg/dL (ref 5–40)

## 2021-07-19 LAB — BAYER DCA HB A1C WAIVED: HB A1C (BAYER DCA - WAIVED): 5.7 % — ABNORMAL HIGH (ref 4.8–5.6)

## 2021-07-19 LAB — VITAMIN D 25 HYDROXY (VIT D DEFICIENCY, FRACTURES): Vit D, 25-Hydroxy: 23 ng/mL — ABNORMAL LOW (ref 30.0–100.0)

## 2021-07-19 NOTE — Progress Notes (Signed)
Good morning, please let Kiara Parker know her labs have returned.  Kidney function remains stable.  Cholesterol levels look good, continue 3 day a week statin as this is offering benefit.  Vitamin D remains a little on low side, please ensure yo take 2000 units of Vitamin D3 daily for overall bone health.  Any questions? Keep being awesome!!  Thank you for allowing me to participate in your care.  I appreciate you. Kindest regards, Torsha Lemus

## 2021-08-08 ENCOUNTER — Ambulatory Visit: Payer: Medicare HMO

## 2021-08-09 ENCOUNTER — Ambulatory Visit (INDEPENDENT_AMBULATORY_CARE_PROVIDER_SITE_OTHER): Payer: Medicare HMO | Admitting: *Deleted

## 2021-08-09 DIAGNOSIS — Z0000 Encounter for general adult medical examination without abnormal findings: Secondary | ICD-10-CM

## 2021-08-09 NOTE — Progress Notes (Signed)
Subjective:   Kiara Parker is a 70 y.o. female who presents for Medicare Annual (Subsequent) preventive examination.  I connected with  [name] on 08/09/21 by a video enabled telemedicine application and verified that I am speaking with the correct person using two identifiers.   I discussed the limitations of evaluation and management by telemedicine. The patient expressed understanding and agreed to proceed.  Patient location: home  Provider location: tele health visit not in office    Review of Systems     Cardiac Risk Factors include: advanced age (>20men, >54 women);hypertension;obesity (BMI >30kg/m2);sedentary lifestyle     Objective:    Today's Vitals   08/09/21 1249  PainSc: 4    There is no height or weight on file to calculate BMI.  Advanced Directives 08/09/2021 03/18/2021 08/06/2020 11/27/2018 12/04/2017 11/13/2016  Does Patient Have a Medical Advance Directive? No No No No No No  Would patient like information on creating a medical advance directive? No - Patient declined No - Patient declined - No - Patient declined - -    Current Medications (verified) Outpatient Encounter Medications as of 08/09/2021  Medication Sig   ASPIRIN 81 PO Take 1 tablet by mouth daily.   baclofen (LIORESAL) 10 MG tablet Take 0.5 tablets (5 mg total) by mouth at bedtime.   gabapentin (NEURONTIN) 300 MG capsule TAKE 1 CAPSULE BY MOUTH THREE TIMES A DAY   hydrochlorothiazide (HYDRODIURIL) 12.5 MG tablet Take 1 tablet (12.5 mg total) by mouth daily.   meloxicam (MOBIC) 15 MG tablet Take 1 tablet (15 mg total) by mouth daily.   rosuvastatin (CRESTOR) 5 MG tablet TAKE 1 TABLET (5 MG TOTAL) BY MOUTH 3 (THREE) TIMES A WEEK   No facility-administered encounter medications on file as of 08/09/2021.    Allergies (verified) Atorvastatin, Clavulanic acid, Erythromycin, Penicillins, Pravastatin, and Zithromax [azithromycin]   History: Past Medical History:  Diagnosis Date    Chronic pain syndrome    Hyperlipidemia    Hypertension    Lumbago    Spinal stenosis    Past Surgical History:  Procedure Laterality Date   COLONOSCOPY WITH PROPOFOL N/A 11/27/2018   Procedure: COLONOSCOPY WITH PROPOFOL;  Surgeon: Jonathon Bellows, MD;  Location: [facility] ENDOSCOPY;  Service: Gastroenterology;  Laterality: N/A;   TUBAL LIGATION     Family History  Problem Relation Age of Onset   Cancer Mother        breast   Arthritis Mother    Osteoporosis Mother    Breast cancer Mother 21   Heart disease Father    Lung disease Father    Cancer Sister        lymphoma   Multiple sclerosis Sister    Cancer Maternal Grandmother        lymphoma   Social History   Socioeconomic History   Marital status: Married    Spouse name: Not on file   Number of children: Not on file   Years of education: Not on file   Highest education level: Not on file  Occupational History   Occupation: retired  Tobacco Use   Smoking status: Never   Smokeless tobacco: Never  Vaping Use   Vaping Use: Never used  Substance and Sexual Activity   Alcohol use: No   Drug use: No   Sexual activity: Yes    Birth control/protection: None  Other Topics Concern   Not on file  Social History Narrative   Not on file   Social Determinants of  Health   Financial Resource Strain: Low Risk    Difficulty of Paying Living Expenses: Not hard at all  Food Insecurity: No Food Insecurity   Worried About [place] in the Last Year: Never true   Ran Out of Food in the Last Year: Never true  Transportation Needs: No Transportation Needs   Lack of Transportation (Medical): No   Lack of Transportation (Non-Medical): No  Physical Activity: Inactive   Days of Exercise per Week: 0 days   Minutes of Exercise per Session: 0 min  Stress: No Stress Concern Present   Feeling of Stress : Only a little  Social Connections: Engineer, building services of Communication with Friends and Family: More than three  times a week   Frequency of Social Gatherings with Friends and Family: More than three times a week   Attends Religious Services: More than 4 times per year   Active Member of [employer] or Organizations: Yes   Attends Archivist Meetings: 1 to 4 times per year   Marital Status: Married    Tobacco Counseling Counseling given: Not Answered   Clinical Intake:  Pre-visit preparation completed: Yes  Pain : 0-10 Pain Score: 4  Pain Location: Back Pain Descriptors / Indicators: Aching, Burning, Constant Pain Onset: More than a month ago Pain Frequency: Constant Pain Relieving Factors: gabapentin  Pain Relieving Factors: gabapentin  Nutritional Risks: None Diabetes: No  How often do you need to have someone help you when you read instructions, pamphlets, or other written materials from your doctor or pharmacy?: 1 - Never  Diabetic?  no  Interpreter Needed?: No  Information entered by :: Leroy Kennedy LPN   Activities of Daily Living In your present state of health, do you have any difficulty performing the following activities: 08/09/2021  Hearing? N  Vision? N  Difficulty concentrating or making decisions? N  Walking or climbing stairs? N  Dressing or bathing? N  Doing errands, shopping? N  Preparing Food and eating ? N  Using the Toilet? N  In the past six months, have you accidently leaked urine? Y  Comment wears pads little leakage  Do you have problems with loss of bowel control? N  Managing your Medications? N  Managing your Finances? N  Housekeeping or managing your Housekeeping? N  Some recent data might be hidden    Patient Care Team: Venita Lick, NP as PCP - General (Nurse Practitioner)  Indicate any recent Medical Services you may have received from other than [facility] providers in the past year (date may be approximate).     Assessment:   This is a routine wellness examination for [place].  Hearing/Vision screen Hearing Screening - Comments::  No trouble hearing  Vision Screening - Comments:: [facility] Up to date  Dietary issues and exercise activities discussed: Current Exercise Habits: The patient does not participate in regular exercise at present (is going to check silver sneakers), Exercise limited by: None identified   Goals Addressed             This Visit's Progress    Patient Stated   Not on track    08/06/2020, wants to lose 50 pounds     Weight (lb) < 200 lb (90.7 kg)       Would like to loose weight       Depression Screen PHQ 2/9 Scores 08/09/2021 12/28/2020 12/13/2020 10/29/2020 08/06/2020 07/21/2019 06/16/2019  PHQ - 2 Score 0 0 0 0 0  0 0  PHQ- 9 Score - 1 - - - - 1    Fall Risk Fall Risk  08/09/2021 08/09/2021 12/13/2020 10/29/2020 08/06/2020  Falls in the past year? 0 0 0 0 0  Number falls in past yr: 0 0 - - -  Injury with Fall? 0 0 - - -  Risk for fall due to : - - - - Medication side effect  Follow up Falls evaluation completed;Education provided;Falls prevention discussed Falls evaluation completed;Education provided;Falls prevention discussed - - Falls evaluation completed;Education provided;Falls prevention discussed    FALL RISK PREVENTION PERTAINING TO THE HOME:  Any stairs in or around the home? Yes  If so, are there any without handrails? No  Home free of loose throw rugs in walkways, pet beds, electrical cords, etc? Yes  Adequate lighting in your home to reduce risk of falls? Yes   ASSISTIVE DEVICES UTILIZED TO PREVENT FALLS:  Life alert? No  Use of a cane, walker or w/c? No  Grab bars in the bathroom? No  Shower chair or bench in shower? No  Elevated toilet seat or a handicapped toilet? Yes   TIMED UP AND GO:  Was the test performed? No .    Cognitive Function:  Normal cognitive status assessed by direct observation by this Nurse Health Advisor. No abnormalities found.       6CIT Screen 08/06/2020  What Year? 0 points  What month? 0 points  What time? 0 points   Count back from 20 2 points  Months in reverse 0 points  Repeat phrase 0 points  Total Score 2    Immunizations Immunization History  Administered Date(s) Administered   Fluad Quad(high Dose 65+) 07/06/2021   Influenza, High Dose Seasonal PF 07/24/2018, 06/27/2019   Influenza,inj,Quad PF,6+ Mos 09/12/2016   Influenza-Unspecified 07/02/2020   PFIZER Comirnaty(Gray Top)Covid-19 Tri-Sucrose Vaccine 02/07/2021   PFIZER(Purple Top)SARS-COV-2 Vaccination 11/18/2019, 12/09/2019, 07/06/2020   Pfizer Covid-19 Vaccine Bivalent Booster 66yrs & up 06/17/2021   Pneumococcal Conjugate-13 07/24/2018   Pneumococcal Polysaccharide-23 08/23/2019   Tdap 09/12/2016    TDAP status: Up to date  Flu Vaccine status: Up to date  Pneumococcal vaccine status: Up to date  Covid-19 vaccine status: Completed vaccines  Qualifies for Shingles Vaccine? Yes   Zostavax completed Yes   Shingrix Completed?: Yes  Screening Tests Health Maintenance  Topic Date Due   Zoster Vaccines- Shingrix (1 of 2) 10/18/2021 (Originally 08/14/2002)   URINE MICROALBUMIN  12/28/2021   MAMMOGRAM  04/20/2023   COLONOSCOPY (Pts 45-44yrs Insurance coverage will need to be confirmed)  11/27/2023   TETANUS/TDAP  09/12/2026   Pneumonia Vaccine 47+ Years old  Completed   INFLUENZA VACCINE  Completed   DEXA SCAN  Completed   COVID-19 Vaccine  Completed   Hepatitis C Screening  Completed   HPV VACCINES  Aged Out    Health Maintenance  There are no preventive care reminders to display for this patient.   Colorectal cancer screening: Type of screening: Colonoscopy. Completed 2020. Repeat every 10 years  Mammogram status: Completed  . Repeat every year  Bone Density status: Completed  . Results reflect: Bone density results: NORMAL. Repeat every 0 years.  Lung Cancer Screening: (Low Dose CT Chest recommended if Age 74-80 years, 30 pack-year currently smoking OR have quit w/in 15years.) does not qualify.   Lung Cancer  Screening Referral:   Additional Screening:  Hepatitis C Screening: does not qualify; Completed 2022  Vision Screening: Recommended annual ophthalmology exams for early detection  of glaucoma and other disorders of the eye. Is the patient up to date with their annual eye exam?  Yes  Who is the provider or what is the name of the office in which the patient attends annual eye exams? [place] [facility]  If pt is not established with a provider, would they like to be referred to a provider to establish care? No .   Dental Screening: Recommended annual dental exams for proper oral hygiene  Community Resource Referral / Chronic Care Management: CRR required this visit?  No   CCM required this visit?  No      Plan:     I have personally reviewed and noted the following in the patient's chart:   Medical and social history Use of alcohol, tobacco or illicit drugs  Current medications and supplements including opioid prescriptions.  Functional ability and status Nutritional status Physical activity Advanced directives List of other physicians Hospitalizations, surgeries, and ER visits in previous 12 months Vitals Screenings to include cognitive, depression, and falls Referrals and appointments  In addition, I have reviewed and discussed with patient certain preventive protocols, quality metrics, and best practice recommendations. A written personalized care plan for preventive services as well as general preventive health recommendations were provided to patient.     Leroy Kennedy, LPN   [id number]   Nurse Notes:

## 2021-08-09 NOTE — Patient Instructions (Signed)
Kiara Parker , Thank you for taking time to come for your Medicare Wellness Visit. I appreciate your ongoing commitment to your health goals. Please review the following plan we discussed and let me know if I can assist you in the future.   Screening recommendations/referrals: Colonoscopy: up to date Mammogram: up to date Bone Density: up to date Recommended yearly ophthalmology/optometry visit for glaucoma screening and checkup Recommended yearly dental visit for hygiene and checkup  Vaccinations: Influenza vaccine: up to date Pneumococcal vaccine: up to date Tdap vaccine: up to date Shingles vaccine: Education provided    Advanced directives: Education provided  Conditions/risks identified:   Next appointment: 01-16-2021 8:20 Cannady   Preventive Care 69 Years and Older, Female Preventive care refers to lifestyle choices and visits with your health care provider that can promote health and wellness. What does preventive care include? A yearly physical exam. This is also called an annual well check. Dental exams once or twice a year. Routine eye exams. Ask your health care provider how often you should have your eyes checked. Personal lifestyle choices, including: Daily care of your teeth and gums. Regular physical activity. Eating a healthy diet. Avoiding tobacco and drug use. Limiting alcohol use. Practicing safe sex. Taking low-dose aspirin every day. Taking vitamin and mineral supplements as recommended by your health care provider. What happens during an annual well check? The services and screenings done by your health care provider during your annual well check will depend on your age, overall health, lifestyle risk factors, and family history of disease. Counseling  Your health care provider may ask you questions about your: Alcohol use. Tobacco use. Drug use. Emotional well-being. Home and relationship well-being. Sexual activity. Eating habits. History of  falls. Memory and ability to understand (cognition). Work and work Statistician. Reproductive health. Screening  You may have the following tests or measurements: Height, weight, and BMI. Blood pressure. Lipid and cholesterol levels. These may be checked every 5 years, or more frequently if you are over 90 years old. Skin check. Lung cancer screening. You may have this screening every year starting at age 31 if you have a 30-pack-year history of smoking and currently smoke or have quit within the past 15 years. Fecal occult blood test (FOBT) of the stool. You may have this test every year starting at age 71. Flexible sigmoidoscopy or colonoscopy. You may have a sigmoidoscopy every 5 years or a colonoscopy every 10 years starting at age 53. Hepatitis C blood test. Hepatitis B blood test. Sexually transmitted disease (STD) testing. Diabetes screening. This is done by checking your blood sugar (glucose) after you have not eaten for a while (fasting). You may have this done every 1-3 years. Bone density scan. This is done to screen for osteoporosis. You may have this done starting at age 60. Mammogram. This may be done every 1-2 years. Talk to your health care provider about how often you should have regular mammograms. Talk with your health care provider about your test results, treatment options, and if necessary, the need for more tests. Vaccines  Your health care provider may recommend certain vaccines, such as: Influenza vaccine. This is recommended every year. Tetanus, diphtheria, and acellular pertussis (Tdap, Td) vaccine. You may need a Td booster every 10 years. Zoster vaccine. You may need this after age 17. Pneumococcal 13-valent conjugate (PCV13) vaccine. One dose is recommended after age 81. Pneumococcal polysaccharide (PPSV23) vaccine. One dose is recommended after age 29. Talk to your health care provider about  which screenings and vaccines you need and how often you need  them. This information is not intended to replace advice given to you by your health care provider. Make sure you discuss any questions you have with your health care provider. Document Released: 10/08/2015 Document Revised: 05/31/2016 Document Reviewed: 07/13/2015 Elsevier Interactive Patient Education  2017 [employer] Prevention in the Home Falls can cause injuries. They can happen to people of all ages. There are many things you can do to make your home safe and to help prevent falls. What can I do on the outside of my home? Regularly fix the edges of walkways and driveways and fix any cracks. Remove anything that might make you trip as you walk through a door, such as a raised step or threshold. Trim any bushes or trees on the path to your home. Use bright outdoor lighting. Clear any walking paths of anything that might make someone trip, such as rocks or tools. Regularly check to see if handrails are loose or broken. Make sure that both sides of any steps have handrails. Any raised decks and porches should have guardrails on the edges. Have any leaves, snow, or ice cleared regularly. Use sand or salt on walking paths during winter. Clean up any spills in your garage right away. This includes oil or grease spills. What can I do in the bathroom? Use night lights. Install grab bars by the toilet and in the tub and shower. Do not use towel bars as grab bars. Use non-skid mats or decals in the tub or shower. If you need to sit down in the shower, use a plastic, non-slip stool. Keep the floor dry. Clean up any water that spills on the floor as soon as it happens. Remove soap buildup in the tub or shower regularly. Attach bath mats securely with double-sided non-slip rug tape. Do not have throw rugs and other things on the floor that can make you trip. What can I do in the bedroom? Use night lights. Make sure that you have a light by your bed that is easy to reach. Do not use  any sheets or blankets that are too big for your bed. They should not hang down onto the floor. Have a firm chair that has side arms. You can use this for support while you get dressed. Do not have throw rugs and other things on the floor that can make you trip. What can I do in the kitchen? Clean up any spills right away. Avoid walking on wet floors. Keep items that you use a lot in easy-to-reach places. If you need to reach something above you, use a strong step stool that has a grab bar. Keep electrical cords out of the way. Do not use floor polish or wax that makes floors slippery. If you must use wax, use non-skid floor wax. Do not have throw rugs and other things on the floor that can make you trip. What can I do with my stairs? Do not leave any items on the stairs. Make sure that there are handrails on both sides of the stairs and use them. Fix handrails that are broken or loose. Make sure that handrails are as long as the stairways. Check any carpeting to make sure that it is firmly attached to the stairs. Fix any carpet that is loose or worn. Avoid having throw rugs at the top or bottom of the stairs. If you do have throw rugs, attach them to the floor with  carpet tape. Make sure that you have a light switch at the top of the stairs and the bottom of the stairs. If you do not have them, ask someone to add them for you. What else can I do to help prevent falls? Wear shoes that: Do not have high heels. Have rubber bottoms. Are comfortable and fit you well. Are closed at the toe. Do not wear sandals. If you use a stepladder: Make sure that it is fully opened. Do not climb a closed stepladder. Make sure that both sides of the stepladder are locked into place. Ask someone to hold it for you, if possible. Clearly mark and make sure that you can see: Any grab bars or handrails. First and last steps. Where the edge of each step is. Use tools that help you move around (mobility aids)  if they are needed. These include: Canes. Walkers. Scooters. Crutches. Turn on the lights when you go into a dark area. Replace any light bulbs as soon as they burn out. Set up your furniture so you have a clear path. Avoid moving your furniture around. If any of your floors are uneven, fix them. If there are any pets around you, be aware of where they are. Review your medicines with your doctor. Some medicines can make you feel dizzy. This can increase your chance of falling. Ask your doctor what other things that you can do to help prevent falls. This information is not intended to replace advice given to you by your health care provider. Make sure you discuss any questions you have with your health care provider. Document Released: 07/08/2009 Document Revised: 02/17/2016 Document Reviewed: 10/16/2014 Elsevier Interactive Patient Education  2017 [employer].

## 2021-12-13 ENCOUNTER — Ambulatory Visit: Payer: Medicare HMO | Admitting: Dermatology

## 2021-12-13 ENCOUNTER — Other Ambulatory Visit: Payer: Self-pay

## 2021-12-13 DIAGNOSIS — L578 Other skin changes due to chronic exposure to nonionizing radiation: Secondary | ICD-10-CM | POA: Diagnosis not present

## 2021-12-13 DIAGNOSIS — L918 Other hypertrophic disorders of the skin: Secondary | ICD-10-CM | POA: Diagnosis not present

## 2021-12-13 DIAGNOSIS — D229 Melanocytic nevi, unspecified: Secondary | ICD-10-CM | POA: Diagnosis not present

## 2021-12-13 DIAGNOSIS — L814 Other melanin hyperpigmentation: Secondary | ICD-10-CM | POA: Diagnosis not present

## 2021-12-13 DIAGNOSIS — L820 Inflamed seborrheic keratosis: Secondary | ICD-10-CM | POA: Diagnosis not present

## 2021-12-13 DIAGNOSIS — Z1283 Encounter for screening for malignant neoplasm of skin: Secondary | ICD-10-CM

## 2021-12-13 DIAGNOSIS — D1800 Hemangioma unspecified site: Secondary | ICD-10-CM | POA: Diagnosis not present

## 2021-12-13 DIAGNOSIS — L821 Other seborrheic keratosis: Secondary | ICD-10-CM

## 2021-12-13 NOTE — Patient Instructions (Addendum)
Cryotherapy Aftercare ? ?Wash gently with soap and water everyday.   ?Apply Vaseline and Band-Aid daily until healed.  ? ? ?Seborrheic Keratosis ? ?What causes seborrheic keratoses? ?Seborrheic keratoses are harmless, common skin growths that first appear during adult life.  As time goes by, more growths appear.  Some people may develop a large number of them.  Seborrheic keratoses appear on both covered and uncovered body parts.  They are not caused by sunlight.  The tendency to develop seborrheic keratoses can be inherited.  They vary in color from skin-colored to gray, brown, or even black.  They can be either smooth or have a rough, warty surface.   ?Seborrheic keratoses are superficial and look as if they were stuck on the skin.  Under the microscope this type of keratosis looks like layers upon layers of skin.  That is why at times the top layer may seem to fall off, but the rest of the growth remains and re-grows.   ? ?Treatment ?Seborrheic keratoses do not need to be treated, but can easily be removed in the office.  Seborrheic keratoses often cause symptoms when they rub on clothing or jewelry.  Lesions can be in the way of shaving.  If they become inflamed, they can cause itching, soreness, or burning.  Removal of a seborrheic keratosis can be accomplished by freezing, burning, or surgery. ?If any spot bleeds, scabs, or grows rapidly, please return to have it checked, as these can be an indication of a skin cancer. ? ? ?If You Need Anything After Your Visit ? ?If you have any questions or concerns for your doctor, please call our main line at [phone] and press option 4 to reach your doctor's medical assistant. If no one answers, please leave a voicemail as directed and we will return your call as soon as possible. Messages left after 4 pm will be answered the following business day.  ? ?You may also send [place] a message via MyChart. We typically respond to MyChart messages within 1-2 business  days. ? ?For prescription refills, please ask your pharmacy to contact our office. Our fax number is [phone]. ? ?If you have an urgent issue when the clinic is closed that cannot wait until the next business day, you can page your doctor at the number below.   ? ?Please note that while we do our best to be available for urgent issues outside of office hours, we are not available 24/7.  ? ?If you have an urgent issue and are unable to reach [place], you may choose to seek medical care at your doctor's office, retail clinic, urgent care center, or emergency room. ? ?If you have a medical emergency, please immediately call 911 or go to the emergency department. ? ?Pager Numbers ? ?- Dr. Nehemiah Massed: [phone] ? ?- Dr. Laurence Ferrari: [phone] ? ?- Dr. Nicole Kindred: [phone] ? ?In the event of inclement weather, please call our main line at [phone] for an update on the status of any delays or closures. ? ?Dermatology Medication Tips: ?Please keep the boxes that topical medications come in in order to help keep track of the instructions about where and how to use these. Pharmacies typically print the medication instructions only on the boxes and not directly on the medication tubes.  ? ?If your medication is too expensive, please contact our office at [phone] option 4 or send [place] a message through [place].  ? ?We are unable to tell what your co-pay for medications will be in advance  as this is different depending on your insurance coverage. However, we may be able to find a substitute medication at lower cost or fill out paperwork to get insurance to cover a needed medication.  ? ?If a prior authorization is required to get your medication covered by your insurance company, please allow us 1-2 business days to complete this process. ? ?Drug prices often vary depending on where the prescription is filled and some pharmacies may offer cheaper prices. ? ?The website www.goodrx.com contains coupons for medications through  different pharmacies. The prices here do not account for what the cost may be with help from insurance (it may be cheaper with your insurance), but the website can give you the price if you did not use any insurance.  ?- You can print the associated coupon and take it with your prescription to the pharmacy.  ?- You may also stop by our office during regular business hours and pick up a GoodRx coupon card.  ?- If you need your prescription sent electronically to a different pharmacy, notify our office through [facility] MyChart or by phone at [phone] option 4. ? ? ? ? ?Si Usted Necesita Algo Despu?s de Su Visita ? ?Tambi?n puede enviarnos un mensaje a trav?s de MyChart. Por lo general respondemos a los mensajes de MyChart en el transcurso de 1 a 2 d?as h?biles. ? ?Para renovar recetas, por favor pida a su farmacia que se ponga en contacto con nuestra oficina. Nuestro n?mero de fax es el [phone]. ? ?Si tiene un asunto urgente cuando la cl?nica est? cerrada y que no puede esperar hasta el siguiente d?a h?bil, puede llamar/localizar a su doctor(a) al n?mero que aparece a continuaci?n.  ? ?Por favor, tenga en cuenta que aunque hacemos todo lo posible para estar disponibles para asuntos urgentes fuera del horario de oficina, no estamos disponibles las 24 horas del d?a, los 7 d?as de la semana.  ? ?Si tiene un problema urgente y no puede comunicarse con nosotros, puede optar por buscar atenci?n m?dica  en el consultorio de su doctor(a), en una cl?nica privada, en un centro de atenci?n urgente o en una sala de emergencias. ? ?Si tiene una emergencia m?dica, por favor llame inmediatamente al 911 o vaya a la sala de emergencias. ? ?N?meros de b?per ? ?- Dr. Kowalski: [phone] ? ?- Dra. Moye: [phone] ? ?- Dra. Stewart: [phone] ? ?En caso de inclemencias del tiempo, por favor llame a nuestra l?nea principal al [phone] para una actualizaci?n sobre el estado de cualquier retraso o cierre. ? ?Consejos  para la medicaci?n en dermatolog?a: ?Por favor, guarde las cajas en las que vienen los medicamentos de uso t?pico para ayudarle a seguir las instrucciones sobre d?nde y c?mo usarlos. Las farmacias generalmente imprimen las instrucciones del medicamento s?lo en las cajas y no directamente en los tubos del medicamento.  ? ?Si su medicamento es muy caro, por favor, p?ngase en contacto con nuestra oficina llamando al [phone] y presione la opci?n 4 o env?enos un mensaje a trav?s de MyChart.  ? ?No podemos decirle cu?l ser? su copago por los medicamentos por adelantado ya que esto es diferente dependiendo de la cobertura de su seguro. Sin embargo, es posible que podamos encontrar un medicamento sustituto a menor costo o llenar un formulario para que el seguro cubra el medicamento que se considera necesario.  ? ?Si se requiere una autorizaci?n previa para que su compa??a de seguros cubra su medicamento, por favor perm?tanos de 1 a 2 d?as   h?biles para completar este proceso. ? ?Los precios de los medicamentos var?an con frecuencia dependiendo del lugar de d?nde se surte la receta y alguna farmacias pueden ofrecer precios m?s baratos. ? ?El sitio web www.goodrx.com tiene cupones para medicamentos de diferentes farmacias. Los precios aqu? no tienen en cuenta lo que podr?a costar con la ayuda del seguro (puede ser m?s barato con su seguro), pero el sitio web puede darle el precio si no utiliz? ning?n seguro.  ?- Puede imprimir el cup?n correspondiente y llevarlo con su receta a la farmacia.  ?- Tambi?n puede pasar por nuestra oficina durante el horario de atenci?n regular y recoger una tarjeta de cupones de GoodRx.  ?- Si necesita que su receta se env?e electr?nicamente a una farmacia diferente, informe a nuestra oficina a trav?s de MyChart de  o por tel?fono llamando al [phone] y presione la opci?n 4. ? ?

## 2021-12-13 NOTE — Progress Notes (Signed)
? ?New Patient Visit ? ?Subjective  ?Kiara Parker is a 71 y.o. female who presents for the following: Annual Exam. ? ?The patient presents for Total-Body Skin Exam (TBSE) for skin cancer screening and mole check.  The patient has spots, moles and lesions to be evaluated, some may be new or changing and the patient has concerns that these could be cancer. ?She has several growths on her arms, inframammary, lower abdomen that she is concerned about, one area with a history of bleeding. Multiple spots get irritated and itch. ? ?The following portions of the chart were reviewed this encounter and updated as appropriate:  ?  ?  ? ?Review of Systems:  No other skin or systemic complaints except as noted in HPI or Assessment and Plan. ? ?Objective  ?Well appearing patient in no apparent distress; mood and affect are within normal limits. ? ?A full examination was performed including scalp, head, eyes, ears, nose, lips, neck, chest, axillae, abdomen, back, buttocks, bilateral upper extremities, bilateral lower extremities, hands, feet, fingers, toes, fingernails, and toenails. All findings within normal limits unless otherwise noted below. ? ?L post shoulder x 1, R inguinal crease x 1, R inframammary x 2, R upper arm x 1, L breast x 1, L lower back x 3, mid back at braline x 1,  L temple x 1, L lower leg x 1 (12) ?Erythematous stuck-on, waxy papule or plaque ? ? ? ?Assessment & Plan  ?Skin cancer screening performed today. ? ?Actinic Damage ?- chronic, secondary to cumulative UV radiation exposure/sun exposure over time ?- diffuse scaly erythematous macules with underlying dyspigmentation ?- Recommend daily broad spectrum sunscreen SPF 30+ to sun-exposed areas, reapply every 2 hours as needed.  ?- Recommend staying in the shade or wearing long sleeves, sun glasses (UVA+UVB protection) and wide brim hats (4-inch brim around the entire circumference of the hat). ?- Call for new or changing lesions. ? ?Lentigines ?-  Scattered tan macules ?- Due to sun exposure ?- Benign-appering, observe ?- Recommend daily broad spectrum sunscreen SPF 30+ to sun-exposed areas, reapply every 2 hours as needed. ?- Call for any changes ? ?Seborrheic Keratoses ?- Stuck-on, waxy, tan-brown papules and/or plaques  ?- Benign-appearing ?- Discussed benign etiology and prognosis. ?- Observe ?- Call for any changes ? ?Acrochordons (Skin Tags) ?- Fleshy, skin-colored pedunculated papules ?- Benign appearing.  ?- Observe. ?- If desired, they can be removed with an in office procedure that is not covered by insurance. ?- Please call the clinic if you notice any new or changing lesions. ? ?Hemangiomas ?- Red papules ?- Discussed benign nature ?- Observe ?- Call for any changes ? ?Melanocytic Nevi ?- Tan-brown and/or pink-flesh-colored symmetric macules and papules ?- Benign appearing on exam today ?- Observation ?- Call clinic for new or changing moles ?- Recommend daily use of broad spectrum spf 30+ sunscreen to sun-exposed areas.  ? ?Inflamed seborrheic keratosis (12) ?L post shoulder x 1, R inguinal crease x 1, R inframammary x 2, R upper arm x 1, L breast x 1, L lower back x 3, mid back at braline x 1,  L temple x 1, L lower leg x 1 ? ?Destruction of lesion - L post shoulder x 1, R inguinal crease x 1, R inframammary x 2, R upper arm x 1, L breast x 1, L lower back x 3, mid back at braline x 1,  L temple x 1, L lower leg x 1 ? ?Destruction method: cryotherapy   ?Informed  consent: discussed and consent obtained   ?Lesion destroyed using liquid nitrogen: Yes   ?Region frozen until ice ball extended beyond lesion: Yes   ?Outcome: patient tolerated procedure well with no complications   ?Post-procedure details: wound care instructions given   ?Additional details:  Prior to procedure, discussed risks of blister formation, small wound, skin dyspigmentation, or rare scar following cryotherapy. Recommend Vaseline ointment to treated areas while  healing. ? ? ? ?Return in about 2 months (around 02/12/2022) for ISKs. ? ?I, Jamesetta Orleans, CMA, am acting as scribe for Brendolyn Patty, MD . ?Documentation: I have reviewed the above documentation for accuracy and completeness, and I agree with the above. ? ?Brendolyn Patty MD  ? ? ? ?

## 2021-12-31 ENCOUNTER — Other Ambulatory Visit: Payer: Self-pay | Admitting: Nurse Practitioner

## 2022-01-02 NOTE — Telephone Encounter (Signed)
Requested medications are due for refill today.  yes ? ?Requested medications are on the active medications list.  yes ? ?Last refill. 12/28/2020 #90 3 refills ? ?Future visit scheduled.   yes ? ?Notes to clinic.  Failed refill protocol d/t expired labs. ? ? ? ?Requested Prescriptions  ?Pending Prescriptions Disp Refills  ? meloxicam (MOBIC) 15 MG tablet [Pharmacy Med Name: MELOXICAM 15 MG TABLET] 90 tablet 3  ?  Sig: Take 1 tablet (15 mg total) by mouth daily.  ?  ? Analgesics:  COX2 Inhibitors Failed - 12/31/2021  8:57 AM  ?  ?  Failed - Manual Review: Labs are only required if the patient has taken medication for more than 8 weeks.  ?  ?  Failed - HGB in normal range and within 360 days  ?  Hemoglobin  ?Date Value Ref Range Status  ?12/28/2020 14.9 11.1 - 15.9 g/dL Final  ?  ?  ?  ?  Failed - HCT in normal range and within 360 days  ?  Hematocrit  ?Date Value Ref Range Status  ?12/28/2020 45.2 34.0 - 46.6 % Final  ?  ?  ?  ?  Failed - AST in normal range and within 360 days  ?  AST  ?Date Value Ref Range Status  ?12/28/2020 29 0 - 40 IU/L Final  ?  ?  ?  ?  Failed - ALT in normal range and within 360 days  ?  ALT  ?Date Value Ref Range Status  ?12/28/2020 26 0 - 32 IU/L Final  ?  ?  ?  ?  Passed - Cr in normal range and within 360 days  ?  Creatinine, Ser  ?Date Value Ref Range Status  ?07/18/2021 0.83 0.57 - 1.00 mg/dL Final  ?  ?  ?  ?  Passed - eGFR is 30 or above and within 360 days  ?  GFR calc Af Amer  ?Date Value Ref Range Status  ?10/29/2020 71 >59 mL/min/1.73 Final  ?  Comment:  ?  **In accordance with recommendations from the NKF-ASN Task force,** ?  [facility] is in the process of updating its eGFR calculation to the ?  2021 CKD-EPI creatinine equation that estimates kidney function ?  without a race variable. ?  ? ?GFR calc non Af Amer  ?Date Value Ref Range Status  ?10/29/2020 62 >59 mL/min/1.73 Final  ? ?eGFR  ?Date Value Ref Range Status  ?07/18/2021 77 >59 mL/min/1.73 Final  ?  ?  ?  ?  Passed - Patient  is not pregnant  ?  ?  Passed - Valid encounter within last 12 months  ?  Recent Outpatient Visits   ? ?      ? 5 months ago Morbid obesity ([place])  ? [facility] [place], [place] T, NP  ? 9 months ago Acute post-traumatic headache, not intractable  ? [place], Henrine Screws T, NP  ? 1 year ago Morbid obesity ([place])  ? [place], Lyons T, NP  ? 1 year ago Fever, unspecified fever cause  ? [facility] V[place], Avanti, MD  ? 1 year ago Morbid obesity ([place])  ? [facility] [place], Henrine Screws T, NP  ? ?  ?  ?Future Appointments   ? ?        ? In 2 weeks Cannady, Barbaraann Faster, NP [employer]  ? In 1 month Brendolyn Patty, MD [place]  ? ?  ? ?  ?  ?  ?  ?

## 2022-01-02 NOTE — Telephone Encounter (Signed)
Patient last seen 07/18/21 and has appointment 01/16/22 ?

## 2022-01-14 NOTE — Patient Instructions (Incomplete)

## 2022-01-16 ENCOUNTER — Encounter: Payer: Medicare HMO | Admitting: Nurse Practitioner

## 2022-01-16 DIAGNOSIS — G894 Chronic pain syndrome: Secondary | ICD-10-CM

## 2022-01-16 DIAGNOSIS — T466X5A Adverse effect of antihyperlipidemic and antiarteriosclerotic drugs, initial encounter: Secondary | ICD-10-CM

## 2022-01-16 DIAGNOSIS — E559 Vitamin D deficiency, unspecified: Secondary | ICD-10-CM

## 2022-01-16 DIAGNOSIS — E782 Mixed hyperlipidemia: Secondary | ICD-10-CM

## 2022-01-16 DIAGNOSIS — I10 Essential (primary) hypertension: Secondary | ICD-10-CM

## 2022-01-16 DIAGNOSIS — R7303 Prediabetes: Secondary | ICD-10-CM

## 2022-01-16 DIAGNOSIS — Z0000 Encounter for general adult medical examination without abnormal findings: Secondary | ICD-10-CM

## 2022-01-22 NOTE — Patient Instructions (Signed)

## 2022-01-25 ENCOUNTER — Ambulatory Visit (INDEPENDENT_AMBULATORY_CARE_PROVIDER_SITE_OTHER): Payer: Medicare HMO | Admitting: Nurse Practitioner

## 2022-01-25 ENCOUNTER — Encounter: Payer: Self-pay | Admitting: Nurse Practitioner

## 2022-01-25 DIAGNOSIS — M48061 Spinal stenosis, lumbar region without neurogenic claudication: Secondary | ICD-10-CM | POA: Diagnosis not present

## 2022-01-25 DIAGNOSIS — Z0000 Encounter for general adult medical examination without abnormal findings: Secondary | ICD-10-CM | POA: Diagnosis not present

## 2022-01-25 DIAGNOSIS — Z6839 Body mass index (BMI) 39.0-39.9, adult: Secondary | ICD-10-CM

## 2022-01-25 DIAGNOSIS — E782 Mixed hyperlipidemia: Secondary | ICD-10-CM

## 2022-01-25 DIAGNOSIS — M791 Myalgia, unspecified site: Secondary | ICD-10-CM | POA: Diagnosis not present

## 2022-01-25 DIAGNOSIS — R7303 Prediabetes: Secondary | ICD-10-CM | POA: Diagnosis not present

## 2022-01-25 DIAGNOSIS — Z23 Encounter for immunization: Secondary | ICD-10-CM

## 2022-01-25 DIAGNOSIS — G894 Chronic pain syndrome: Secondary | ICD-10-CM

## 2022-01-25 DIAGNOSIS — I10 Essential (primary) hypertension: Secondary | ICD-10-CM

## 2022-01-25 DIAGNOSIS — T466X5A Adverse effect of antihyperlipidemic and antiarteriosclerotic drugs, initial encounter: Secondary | ICD-10-CM | POA: Diagnosis not present

## 2022-01-25 DIAGNOSIS — E559 Vitamin D deficiency, unspecified: Secondary | ICD-10-CM

## 2022-01-25 LAB — MICROALBUMIN, URINE WAIVED
Creatinine, Urine Waived: 200 mg/dL (ref 10–300)
Microalb, Ur Waived: 30 mg/L — ABNORMAL HIGH (ref 0–19)
Microalb/Creat Ratio: <30 mg/g (ref <30)

## 2022-01-25 LAB — BAYER DCA HB A1C WAIVED: HB A1C (BAYER DCA - WAIVED): 5.7 % — ABNORMAL HIGH (ref 4.8–5.6)

## 2022-01-25 MED ORDER — GABAPENTIN 300 MG PO CAPS: ORAL_CAPSULE | ORAL | 4 refills | Status: DC

## 2022-01-25 MED ORDER — SHINGRIX 50 MCG/0.5ML IM SUSR: 0.5000 mL | Freq: Once | INTRAMUSCULAR | 0 refills | Status: AC

## 2022-01-25 MED ORDER — HYDROCHLOROTHIAZIDE 12.5 MG PO TABS: 12.5000 mg | ORAL_TABLET | Freq: Every day | ORAL | 4 refills | Status: DC

## 2022-01-25 MED ORDER — MELOXICAM 15 MG PO TABS: 15.0000 mg | ORAL_TABLET | Freq: Every day | ORAL | 4 refills | Status: DC

## 2022-01-25 MED ORDER — BACLOFEN 10 MG PO TABS: 5.0000 mg | ORAL_TABLET | Freq: Every day | ORAL | 4 refills | Status: DC

## 2022-01-25 MED ORDER — ROSUVASTATIN CALCIUM 5 MG PO TABS: ORAL_TABLET | ORAL | 4 refills | Status: DC

## 2022-01-25 NOTE — Progress Notes (Signed)
? ?BP 122/74   Pulse 67   Temp 97.7 ?F (36.5 ?C) (Oral)   Ht '5\' 4"'$  (1.626 m)   Wt 222 lb 6.4 oz (100.9 kg)   LMP  (LMP Unknown)   SpO2 94%   BMI 38.17 kg/m?   ? ?Subjective:  ? ? Patient ID: Kiara Parker, female    DOB: 1951/05/22, 71 y.o.   MRN: [id number] ? ?HPI: ?Kiara Parker is a 71 y.o. female presenting on 01/25/2022 for comprehensive medical examination. Current medical complaints include:none ? ?She currently lives with: significant other, 56 years of marriage ?Menopausal Symptoms: no  ? ?HYPERLIPIDEMIA ?Continues on Crestor 5 MG three days a week.  She states the Crestor on three day schedule is working better and less side effects.  Continues on HCTZ 12.5 MG  ?Hyperlipidemia status: good compliance ?Satisfied with current treatment?  no ?Side effects:  no ?Medication compliance: good compliance ?Past cholesterol meds: Lipitor and Pravastatin ?Supplements: none ?Aspirin:  yes ?The 10-year ASCVD risk score (Arnett DK, et al., 2019) is: 18.6% ?  Values used to calculate the score: ?    Age: 24 years ?    Sex: Female ?    Is Non-Hispanic African American: No ?    Diabetic: Yes ?    Tobacco smoker: No ?    Systolic Blood Pressure: 381 mmHg ?    Is BP treated: Yes ?    HDL Cholesterol: 48 mg/dL ?    Total Cholesterol: 167 mg/dL ?Chest pain:  no ?Coronary artery disease:  no ?Family history CAD:  no ?Family history early CAD:  no  ?  ?PREDIABETES: ?A1C in October 5.7%.  Diet focused at this time, although she reports not always following diet well. She has history of low Vitamin D on labs in October 35.2.  She was treated with high dose Vit D, but now taking 1000 units daily.  No recent falls or fractures. ?Polydipsia/polyuria: no ?Visual disturbance: no ?Chest pain: no ?Paresthesias: no ? ?BACK PAIN ?Has seen neurosurgery at [facility], but her provider recently retired.  Has received injections in past. Last saw 02/17/21 and received PT as well. Is taking Gabapentin 3 times a day +  Baclofen at bedtime + Meloxicam daily.   ?  ?Had MRI lumbar spine in 2014 noting multilevel disc "degeneration with particular prominent degeneration and facet hypertrophy at L4-L5 resulting in prominent spinal stenosis at this level".  Last bone density on 10/09/19 was normal.  No current pain. ?Duration: years ?Mechanism of injury: no trauma ?Location: L>R, midline and low back + left hip ?Onset: gradual ?Severity: improved with epidural recently ?Aggravating factors: movement, walking and bending -- walking especially ?Alleviating factors: epidural ?Status: better ?Treatments attempted: rest, heat and aleve and medications noted above -- epidural  ?Relief with NSAIDs?: moderate ?Nighttime pain: none ?Paresthesias / decreased sensation:  no ?Bowel / bladder incontinence:  no ?Fevers:  no ?Dysuria / urinary frequency:  no ? ?Depression Screen done today and results listed below:  ? ?  01/25/2022  ?  9:12 AM 08/09/2021  ? 12:55 PM 12/28/2020  ?  8:14 AM 12/13/2020  ? 10:58 AM 10/29/2020  ? 10:38 AM  ?Depression screen PHQ 2/9  ?Decreased Interest 0 0 0 0 0  ?Down, Depressed, Hopeless 0 0 0 0 0  ?PHQ - 2 Score 0 0 0 0 0  ?Altered sleeping 1  0    ?Tired, decreased energy 1  1    ?Change in appetite 1  0    ?Feeling bad or failure about yourself  0  0    ?Trouble concentrating 0  0    ?Moving slowly or fidgety/restless 0  0    ?Suicidal thoughts 0  0    ?PHQ-9 Score 3  1    ?Difficult doing work/chores Not difficult at all      ? ? ?  01/25/2022  ?  9:12 AM 12/28/2020  ?  8:15 AM  ?GAD 7 : Generalized Anxiety Score  ?Nervous, Anxious, on Edge 0 0  ?Control/stop worrying 0 0  ?Worry too much - different things 0 0  ?Trouble relaxing 0 0  ?Restless 0 0  ?Easily annoyed or irritable 0 0  ?Afraid - awful might happen 0 0  ?Total GAD 7 Score 0 0  ?Anxiety Difficulty Not difficult at all   ? ? ?  12/13/2020  ? 10:57 AM 03/18/2021  ?  1:07 PM 08/09/2021  ? 12:52 PM 08/09/2021  ? 12:58 PM 01/25/2022  ?  9:12 AM  ?Fall Risk  ?Falls in the  past year? 0  0 0 0  ?Was there an injury with Fall?   0 0 0  ?Fall Risk Category Calculator   0 0 0  ?Fall Risk Category   Low Low Low  ?Patient Fall Risk Level  Moderate fall risk Low fall risk Low fall risk Low fall risk  ?Patient at Risk for Falls Due to     No Fall Risks  ?Fall risk Follow up   Falls evaluation completed;Education provided;Falls prevention discussed Falls evaluation completed;Education provided;Falls prevention discussed Education provided  ?  ?Functional Status Survey: ?Is the patient deaf or have difficulty hearing?: No ?Does the patient have difficulty seeing, even when wearing glasses/contacts?: No ?Does the patient have difficulty concentrating, remembering, or making decisions?: No ?Does the patient have difficulty walking or climbing stairs?: No ?Does the patient have difficulty dressing or bathing?: No ?Does the patient have difficulty doing errands alone such as visiting a doctor's office or shopping?: No  ? ?Past Medical History:  ?Past Medical History:  ?Diagnosis Date  ? Chronic pain syndrome   ? Hyperlipidemia   ? Hypertension   ? Lumbago   ? Spinal stenosis   ? ? ?Surgical History:  ?Past Surgical History:  ?Procedure Laterality Date  ? COLONOSCOPY WITH PROPOFOL N/A 11/27/2018  ? Procedure: COLONOSCOPY WITH PROPOFOL;  Surgeon: Jonathon Bellows, MD;  Location: [facility] ENDOSCOPY;  Service: Gastroenterology;  Laterality: N/A;  ? TUBAL LIGATION    ? ? ?Medications:  ?Current Outpatient Medications on File Prior to Visit  ?Medication Sig  ? ASPIRIN 81 PO Take 1 tablet by mouth daily.  ? ?No current facility-administered medications on file prior to visit.  ? ? ?Allergies:  ?Allergies  ?Allergen Reactions  ? Atorvastatin Other (See Comments)  ? Clavulanic Acid   ? Erythromycin   ? Penicillins Hives  ? Pravastatin Other (See Comments)  ? Zithromax [Azithromycin] Hives  ? ? ?Social History:  ?Social History  ? ?Socioeconomic History  ? Marital status: Married  ?  Spouse name: Not on file  ?  Number of children: Not on file  ? Years of education: Not on file  ? Highest education level: Not on file  ?Occupational History  ? Occupation: retired  ?Tobacco Use  ? Smoking status: Never  ? Smokeless tobacco: Never  ?Vaping Use  ? Vaping Use: Never used  ?Substance and Sexual Activity  ? Alcohol use: No  ?  Drug use: No  ? Sexual activity: Yes  ?  Birth control/protection: None  ?Other Topics Concern  ? Not on file  ?Social History Narrative  ? Not on file  ? ?Social Determinants of Health  ? ?Financial Resource Strain: Low Risk   ? Difficulty of Paying Living Expenses: Not hard at all  ?Food Insecurity: No Food Insecurity  ? Worried About Charity fundraiser in the Last Year: Never true  ? Ran Out of Food in the Last Year: Never true  ?Transportation Needs: No Transportation Needs  ? Lack of Transportation (Medical): No  ? Lack of Transportation (Non-Medical): No  ?Physical Activity: Inactive  ? Days of Exercise per Week: 0 days  ? Minutes of Exercise per Session: 0 min  ?Stress: No Stress Concern Present  ? Feeling of Stress : Only a little  ?Social Connections: Socially Integrated  ? Frequency of Communication with Friends and Family: More than three times a week  ? Frequency of Social Gatherings with Friends and Family: More than three times a week  ? Attends Religious Services: More than 4 times per year  ? Active Member of Clubs or Organizations: Yes  ? Attends Archivist Meetings: 1 to 4 times per year  ? Marital Status: Married  ?Intimate Partner Violence: Not At Risk  ? Fear of Current or Ex-Partner: No  ? Emotionally Abused: No  ? Physically Abused: No  ? Sexually Abused: No  ? ?Social History  ? ?Tobacco Use  ?Smoking Status Never  ?Smokeless Tobacco Never  ? ?Social History  ? ?Substance and Sexual Activity  ?Alcohol Use No  ? ? ?Family History:  ?Family History  ?Problem Relation Age of Onset  ? Cancer Mother   ?     breast  ? Arthritis Mother   ? Osteoporosis Mother   ? Breast cancer  Mother 61  ? Heart disease Father   ? Lung disease Father   ? Cancer Sister   ?     lymphoma  ? Multiple sclerosis Sister   ? Cancer Maternal Grandmother   ?     lymphoma  ? ? ?Past medical history, surgical history, medicati

## 2022-01-25 NOTE — Assessment & Plan Note (Signed)
Ongoing with A1C 5.7% today, stable, she continues to be between 5.6 to 6.3% on review.  Will adjust plan of care and add medication as needed.  Continue diet focus at this time.  Urine ALB 30 today, consider ACE or ARB in future. ?

## 2022-01-25 NOTE — Assessment & Plan Note (Signed)
Chronic, with acute flare.  Continue to collaborate with neurosurgery and PT as needed  -- recommend she return to neurosurgery to discuss steroid injection.  Have recommended modest weight loss.  Continue Baclofen and Gabapentin at home, refills up to date.  No red flag symptoms today.  Return in 6 months. ?

## 2022-01-25 NOTE — Assessment & Plan Note (Signed)
Chronic, stable with BP below goal. Continue low dose HCTZ and adjust as needed. Recommend she monitor BP at least a few mornings a week at home and document.  DASH diet at home.  Labs today: CMP, CBC, Urine ALB.  Urine ALB 30 on check today, may benefit from ACE or ARB in future.  Return in 6 months. ? ?

## 2022-01-25 NOTE — Assessment & Plan Note (Signed)
At this time is tolerating Crestor on 3 day a week schedule, adjust this as needed.   If symptoms present then consider weekly dosing or change to Repatha.  Lipid panel today. 

## 2022-01-25 NOTE — Assessment & Plan Note (Signed)
BMI 38.17 with HTN and Prediabetes.  Recommended eating smaller high protein, low fat meals more frequently and exercising 30 mins a day 5 times a week with a goal of 10-15lb weight loss in the next 3 months. Patient voiced their understanding and motivation to adhere to these recommendations. ? ?

## 2022-01-25 NOTE — Assessment & Plan Note (Signed)
Refer to morbid obesity plan of care for further. ?

## 2022-01-25 NOTE — Assessment & Plan Note (Signed)
Chronic, ongoing.  Continue daily supplement and recheck Vit D level today, adjust dosing as needed.  Recent DEXA with normal findings -- due to repeat. ?

## 2022-01-25 NOTE — Assessment & Plan Note (Signed)
Chronic, ongoing.  Is tolerating Crestor on 3 day week schedule.  Will continue this and adjust as needed.  Obtain lipid panel today. 

## 2022-01-26 LAB — CBC WITH DIFFERENTIAL/PLATELET
Basophils Absolute: 0 10*3/uL (ref 0.0–0.2)
Basos: 1 %
EOS (ABSOLUTE): 0.3 10*3/uL (ref 0.0–0.4)
Eos: 5 %
Hematocrit: 42 % (ref 34.0–46.6)
Hemoglobin: 14.5 g/dL (ref 11.1–15.9)
Immature Grans (Abs): 0 10*3/uL (ref 0.0–0.1)
Immature Granulocytes: 0 %
Lymphocytes Absolute: 2.1 10*3/uL (ref 0.7–3.1)
Lymphs: 30 %
MCH: 30.7 pg (ref 26.6–33.0)
MCHC: 34.5 g/dL (ref 31.5–35.7)
MCV: 89 fL (ref 79–97)
Monocytes Absolute: 0.5 10*3/uL (ref 0.1–0.9)
Monocytes: 7 %
Neutrophils Absolute: 4.2 10*3/uL (ref 1.4–7.0)
Neutrophils: 57 %
Platelets: 286 10*3/uL (ref 150–450)
RBC: 4.73 x10E6/uL (ref 3.77–5.28)
RDW: 13.1 % (ref 11.7–15.4)
WBC: 7.2 10*3/uL (ref 3.4–10.8)

## 2022-01-26 LAB — LIPID PANEL W/O CHOL/HDL RATIO
Cholesterol, Total: 178 mg/dL (ref 100–199)
HDL: 53 mg/dL (ref >39)
LDL Chol Calc (NIH): 104 mg/dL — ABNORMAL HIGH (ref 0–99)
Triglycerides: 117 mg/dL (ref 0–149)
VLDL Cholesterol Cal: 21 mg/dL (ref 5–40)

## 2022-01-26 LAB — TSH: TSH: 1.73 u[IU]/mL (ref 0.450–4.500)

## 2022-01-26 LAB — COMPREHENSIVE METABOLIC PANEL
ALT: 22 IU/L (ref 0–32)
AST: 20 IU/L (ref 0–40)
Albumin/Globulin Ratio: 1.7 (ref 1.2–2.2)
Albumin: 4.3 g/dL (ref 3.8–4.8)
Alkaline Phosphatase: 69 IU/L (ref 44–121)
BUN/Creatinine Ratio: 19 (ref 12–28)
BUN: 15 mg/dL (ref 8–27)
Bilirubin Total: 0.4 mg/dL (ref 0.0–1.2)
CO2: 26 mmol/L (ref 20–29)
Calcium: 9.4 mg/dL (ref 8.7–10.3)
Chloride: 100 mmol/L (ref 96–106)
Creatinine, Ser: 0.79 mg/dL (ref 0.57–1.00)
Globulin, Total: 2.6 g/dL (ref 1.5–4.5)
Glucose: 104 mg/dL — ABNORMAL HIGH (ref 70–99)
Potassium: 4 mmol/L (ref 3.5–5.2)
Sodium: 141 mmol/L (ref 134–144)
Total Protein: 6.9 g/dL (ref 6.0–8.5)
eGFR: 81 mL/min/{1.73_m2} (ref >59)

## 2022-01-26 LAB — VITAMIN D 25 HYDROXY (VIT D DEFICIENCY, FRACTURES): Vit D, 25-Hydroxy: 18.6 ng/mL — ABNORMAL LOW (ref 30.0–100.0)

## 2022-01-26 NOTE — Progress Notes (Signed)
Good morning crew, please let Kiara Parker know labs have returned: ?- Kidney function, creatinine and eGFR, remains normal, as is liver function, AST and ALT.   ?- Cholesterol levels shows mild elevation in LDL -- please continue to take Rosuvastatin and work on diet changes and regular exercise -- at least 30 minutes 5 days a week. ?- Vitamin D level remains a little on low side, please ensure you are taking Vitamin D3 2000 units daily for bone health. ?All other labs are stable.  Great job!!  Continue all medications. ?Keep being stellar!!  Thank you for allowing me to participate in your care.  I appreciate you. ?Kindest regards, ?Geovana Gebel ?

## 2022-02-14 ENCOUNTER — Ambulatory Visit: Payer: Medicare HMO | Admitting: Dermatology

## 2022-02-14 ENCOUNTER — Encounter: Payer: Self-pay | Admitting: Dermatology

## 2022-02-14 DIAGNOSIS — L820 Inflamed seborrheic keratosis: Secondary | ICD-10-CM

## 2022-02-14 DIAGNOSIS — L814 Other melanin hyperpigmentation: Secondary | ICD-10-CM

## 2022-02-14 DIAGNOSIS — L821 Other seborrheic keratosis: Secondary | ICD-10-CM | POA: Diagnosis not present

## 2022-02-14 NOTE — Patient Instructions (Signed)
Cryotherapy Aftercare  Wash gently with soap and water everyday.   Apply Vaseline daily until healed.   Prior to procedure, discussed risks of blister formation, small wound, skin dyspigmentation, or rare scar following cryotherapy. Recommend Vaseline ointment to treated areas while healing.    If You Need Anything After Your Visit  If you have any questions or concerns for your doctor, please call our main line at [phone] and press option 4 to reach your doctor's medical assistant. If no one answers, please leave a voicemail as directed and we will return your call as soon as possible. Messages left after 4 pm will be answered the following business day.   You may also send [place] a message via [place]. We typically respond to MyChart messages within 1-2 business days.  For prescription refills, please ask your pharmacy to contact our office. Our fax number is [phone].  If you have an urgent issue when the clinic is closed that cannot wait until the next business day, you can page your doctor at the number below.    Please note that while we do our best to be available for urgent issues outside of office hours, we are not available 24/7.   If you have an urgent issue and are unable to reach [place], you may choose to seek medical care at your doctor's office, retail clinic, urgent care center, or emergency room.  If you have a medical emergency, please immediately call 911 or go to the emergency department.  Pager Numbers  - Dr. Nehemiah Massed: [phone]  - Dr. Laurence Ferrari: [phone]  - Dr. Nicole Kindred: [phone]  In the event of inclement weather, please call our main line at [phone] for an update on the status of any delays or closures.  Dermatology Medication Tips: Please keep the boxes that topical medications come in in order to help keep track of the instructions about where and how to use these. Pharmacies typically print the medication instructions only on the boxes and not  directly on the medication tubes.   If your medication is too expensive, please contact our office at [phone] option 4 or send [place] a message through [place].   We are unable to tell what your co-pay for medications will be in advance as this is different depending on your insurance coverage. However, we may be able to find a substitute medication at lower cost or fill out paperwork to get insurance to cover a needed medication.   If a prior authorization is required to get your medication covered by your insurance company, please allow [place] 1-2 business days to complete this process.  Drug prices often vary depending on where the prescription is filled and some pharmacies may offer cheaper prices.  The website www.goodrx.com contains coupons for medications through different pharmacies. The prices here do not account for what the cost may be with help from insurance (it may be cheaper with your insurance), but the website can give you the price if you did not use any insurance.  - You can print the associated coupon and take it with your prescription to the pharmacy.  - You may also stop by our office during regular business hours and pick up a GoodRx coupon card.  - If you need your prescription sent electronically to a different pharmacy, notify our office through [facility] or by phone at [phone] option 4.     Si Usted Necesita Algo Despus de Su Visita  Tambin puede enviarnos un mensaje a travs de  MyChart. Por lo general respondemos a los mensajes de MyChart en el transcurso de 1 a 2 das hbiles.  Para renovar recetas, por favor pida a su farmacia que se ponga en contacto con nuestra oficina. Harland Dingwall de fax es [place] [phone].  Si tiene un asunto urgente cuando la clnica est cerrada y que no puede esperar hasta el siguiente da hbil, puede llamar/localizar a su doctor(a) al nmero que aparece a continuacin.   Por favor, tenga en cuenta que aunque hacemos  todo lo posible para estar disponibles para asuntos urgentes fuera del horario de [place], no estamos disponibles las 24 horas del da, los 7 das de la [place].   Si tiene un problema urgente y no puede comunicarse con nosotros, puede optar por buscar atencin mdica  en el consultorio de su doctor(a), en una clnica privada, en un centro de atencin urgente o en una sala de emergencias.  Si tiene Engineering geologist, por favor llame inmediatamente al 911 o vaya a la sala de emergencias.  Nmeros de bper  - Dr. Nehemiah Massed: [phone]  - Dra. Moye: [phone]  - Dra. Nicole Kindred: [phone]  En caso de inclemencias del [place], por favor llame a Johnsie Kindred principal al [phone] para una actualizacin sobre el [place] de cualquier retraso o cierre.  Consejos para la medicacin en dermatologa: Por favor, guarde las cajas en las que vienen los medicamentos de uso tpico para ayudarle a seguir las instrucciones sobre dnde y cmo usarlos. Las farmacias generalmente imprimen las instrucciones del medicamento slo en las cajas y no directamente en los tubos del [place].   Si su medicamento es muy caro, por favor, pngase en contacto con Zigmund Daniel llamando al [phone] y presione la opcin 4 o envenos un mensaje a travs de Pharmacist, community.   No podemos decirle cul ser su copago por los medicamentos por adelantado ya que esto es diferente dependiendo de la cobertura de su seguro. Sin embargo, es posible que podamos encontrar un medicamento sustituto a Electrical engineer un formulario para que el seguro cubra el medicamento que se considera necesario.   Si se requiere una autorizacin previa para que su compaa de seguros [place] su medicamento, por favor permtanos de 1 a 2 das hbiles para completar este proceso.  Los precios de los medicamentos varan con frecuencia dependiendo del Environmental consultant de dnde se surte la receta y alguna farmacias pueden ofrecer precios ms baratos.  El  sitio web www.goodrx.com tiene cupones para medicamentos de Airline pilot. Los precios aqu no tienen en cuenta lo que podra costar con la ayuda del seguro (puede ser ms barato con su seguro), pero el sitio web puede darle el precio si no utiliz Research scientist (physical sciences).  - Puede imprimir el cupn correspondiente y llevarlo con su receta a la farmacia.  - Tambin puede pasar por nuestra oficina durante el horario de atencin regular y Charity fundraiser una tarjeta de cupones de GoodRx.  - Si necesita que su receta se enve electrnicamente a una farmacia diferente, informe a nuestra oficina a travs de MyChart de [place] o por telfono llamando al [phone] y presione la opcin 4.

## 2022-02-14 NOTE — Progress Notes (Signed)
   Follow-Up Visit   Subjective  TRINITIE MCGIRR is a 71 y.o. female who presents for the following: ISKs (2 month recheck. Arms, back, face. Tx with LN2 at last visit. Some areas did not peel off (left and right sides, right inguinal crease).  Raised, brown, itching, irritated).  The patient has spots, moles and lesions to be evaluated, some may be new or changing and the patient has concerns that these could be cancer.   The following portions of the chart were reviewed this encounter and updated as appropriate:      Review of Systems: No other skin or systemic complaints except as noted in HPI or Assessment and Plan.   Objective  Well appearing patient in no apparent distress; mood and affect are within normal limits.  A focused examination was performed including torso, right inguinal crease, face. Relevant physical exam findings are noted in the Assessment and Plan.  Left lower hip x1, right inguinal crease x1, right flank x1, right upper forehead x1, left temple x1 (5) Erythematous keratotic or waxy stuck-on papules   Assessment & Plan  Inflamed seborrheic keratosis (5) Left lower hip x1, right inguinal crease x1, right flank x1, right upper forehead x1, left temple x1  Destruction of lesion - Left lower hip x1, right inguinal crease x1, right flank x1, right upper forehead x1, left temple x1  Destruction method: cryotherapy   Informed consent: discussed and consent obtained   Lesion destroyed using liquid nitrogen: Yes   Region frozen until ice ball extended beyond lesion: Yes   Outcome: patient tolerated procedure well with no complications   Post-procedure details: wound care instructions given   Additional details:  Prior to procedure, discussed risks of blister formation, small wound, skin dyspigmentation, or rare scar following cryotherapy. Recommend Vaseline ointment to treated areas while healing.    Seborrheic Keratoses - Stuck-on, waxy, tan-brown papules  and/or plaques  - Benign-appearing - Discussed benign etiology and prognosis. - Observe - Call for any changes  Lentigines - Scattered tan macules - Due to sun exposure - Benign-appering, observe - Recommend daily broad spectrum sunscreen SPF 30+ to sun-exposed areas, reapply every 2 hours as needed. - Call for any changes    Return if symptoms worsen or fail to improve.  I, Emelia Salisbury, CMA, am acting as scribe for Brendolyn Patty, MD.  Documentation: I have reviewed the above documentation for accuracy and completeness, and I agree with the above.  Brendolyn Patty MD

## 2022-06-02 ENCOUNTER — Encounter: Payer: Self-pay | Admitting: Nurse Practitioner

## 2022-07-28 ENCOUNTER — Ambulatory Visit: Payer: Medicare HMO | Admitting: Nurse Practitioner

## 2022-07-30 NOTE — Patient Instructions (Incomplete)

## 2022-08-01 ENCOUNTER — Ambulatory Visit: Payer: Medicare HMO | Admitting: Nurse Practitioner

## 2022-08-01 DIAGNOSIS — E559 Vitamin D deficiency, unspecified: Secondary | ICD-10-CM

## 2022-08-01 DIAGNOSIS — R7303 Prediabetes: Secondary | ICD-10-CM

## 2022-08-01 DIAGNOSIS — T466X5A Adverse effect of antihyperlipidemic and antiarteriosclerotic drugs, initial encounter: Secondary | ICD-10-CM

## 2022-08-01 DIAGNOSIS — I10 Essential (primary) hypertension: Secondary | ICD-10-CM

## 2022-08-01 DIAGNOSIS — Z6839 Body mass index (BMI) 39.0-39.9, adult: Secondary | ICD-10-CM

## 2022-08-01 DIAGNOSIS — E782 Mixed hyperlipidemia: Secondary | ICD-10-CM

## 2022-08-01 DIAGNOSIS — Z23 Encounter for immunization: Secondary | ICD-10-CM

## 2022-08-20 NOTE — Patient Instructions (Signed)

## 2022-08-21 ENCOUNTER — Ambulatory Visit (INDEPENDENT_AMBULATORY_CARE_PROVIDER_SITE_OTHER): Payer: Medicare HMO | Admitting: *Deleted

## 2022-08-21 ENCOUNTER — Ambulatory Visit: Payer: Self-pay

## 2022-08-21 DIAGNOSIS — Z0000 Encounter for general adult medical examination without abnormal findings: Secondary | ICD-10-CM | POA: Diagnosis not present

## 2022-08-21 NOTE — Patient Instructions (Signed)
Kiara Parker , Thank you for taking time to come for your Medicare Wellness Visit. I appreciate your ongoing commitment to your health goals. Please review the following plan we discussed and let me know if I can assist you in the future.   These are the goals we discussed:  Goals      Patient Stated     08/06/2020, wants to lose 50 pounds     Weight (lb) < 200 lb (90.7 kg)     Would like to loose weight     Weight (lb) < 200 lb (90.7 kg)        This is a list of the screening recommended for you and due dates:  Health Maintenance  Topic Date Due   Flu Shot  04/25/2022   COVID-19 Vaccine (6 - 2023-24 season) 09/06/2022*   Zoster (Shingles) Vaccine (1 of 2) 11/21/2022*   Mammogram  04/20/2023   Medicare Annual Wellness Visit  08/22/2023   Colon Cancer Screening  11/27/2023   Pneumonia Vaccine  Completed   DEXA scan (bone density measurement)  Completed   Hepatitis C Screening: USPSTF Recommendation to screen - Ages 61-79 yo.  Completed   HPV Vaccine  Aged Out  *Topic was postponed. The date shown is not the original due date.    Advanced directives: Education provided  Conditions/risks identified:      Preventive Care 4 Years and Older, Female Preventive care refers to lifestyle choices and visits with your health care provider that can promote health and wellness. What does preventive care include? A yearly physical exam. This is also called an annual well check. Dental exams once or twice a year. Routine eye exams. Ask your health care provider how often you should have your eyes checked. Personal lifestyle choices, including: Daily care of your teeth and gums. Regular physical activity. Eating a healthy diet. Avoiding tobacco and drug use. Limiting alcohol use. Practicing safe sex. Taking low-dose aspirin every day. Taking vitamin and mineral supplements as recommended by your health care provider. What happens during an annual well check? The services and  screenings done by your health care provider during your annual well check will depend on your age, overall health, lifestyle risk factors, and family history of disease. Counseling  Your health care provider may ask you questions about your: Alcohol use. Tobacco use. Drug use. Emotional well-being. Home and relationship well-being. Sexual activity. Eating habits. History of falls. Memory and ability to understand (cognition). Work and work Statistician. Reproductive health. Screening  You may have the following tests or measurements: Height, weight, and BMI. Blood pressure. Lipid and cholesterol levels. These may be checked every 5 years, or more frequently if you are over 19 years old. Skin check. Lung cancer screening. You may have this screening every year starting at age 89 if you have a 30-pack-year history of smoking and currently smoke or have quit within the past 15 years. Fecal occult blood test (FOBT) of the stool. You may have this test every year starting at age 59. Flexible sigmoidoscopy or colonoscopy. You may have a sigmoidoscopy every 5 years or a colonoscopy every 10 years starting at age 77. Hepatitis C blood test. Hepatitis B blood test. Sexually transmitted disease (STD) testing. Diabetes screening. This is done by checking your blood sugar (glucose) after you have not eaten for a while (fasting). You may have this done every 1-3 years. Bone density scan. This is done to screen for osteoporosis. You may have this done starting  at age 71. Mammogram. This may be done every 1-2 years. Talk to your health care provider about how often you should have regular mammograms. Talk with your health care provider about your test results, treatment options, and if necessary, the need for more tests. Vaccines  Your health care provider may recommend certain vaccines, such as: Influenza vaccine. This is recommended every year. Tetanus, diphtheria, and acellular pertussis (Tdap,  Td) vaccine. You may need a Td booster every 10 years. Zoster vaccine. You may need this after age 71. Pneumococcal 13-valent conjugate (PCV13) vaccine. One dose is recommended after age 12. Pneumococcal polysaccharide (PPSV23) vaccine. One dose is recommended after age 53. Talk to your health care provider about which screenings and vaccines you need and how often you need them. This information is not intended to replace advice given to you by your health care provider. Make sure you discuss any questions you have with your health care provider. Document Released: 10/08/2015 Document Revised: 05/31/2016 Document Reviewed: 07/13/2015 Elsevier Interactive Patient Education  2017 [employer] Prevention in the Home Falls can cause injuries. They can happen to people of all ages. There are many things you can do to make your home safe and to help prevent falls. What can I do on the outside of my home? Regularly fix the edges of walkways and driveways and fix any cracks. Remove anything that might make you trip as you walk through a door, such as a raised step or threshold. Trim any bushes or trees on the path to your home. Use bright outdoor lighting. Clear any walking paths of anything that might make someone trip, such as rocks or tools. Regularly check to see if handrails are loose or broken. Make sure that both sides of any steps have handrails. Any raised decks and porches should have guardrails on the edges. Have any leaves, snow, or ice cleared regularly. Use sand or salt on walking paths during winter. Clean up any spills in your garage right away. This includes oil or grease spills. What can I do in the bathroom? Use night lights. Install grab bars by the toilet and in the tub and shower. Do not use towel bars as grab bars. Use non-skid mats or decals in the tub or shower. If you need to sit down in the shower, use a plastic, non-slip stool. Keep the floor dry. Clean up any  water that spills on the floor as soon as it happens. Remove soap buildup in the tub or shower regularly. Attach bath mats securely with double-sided non-slip rug tape. Do not have throw rugs and other things on the floor that can make you trip. What can I do in the bedroom? Use night lights. Make sure that you have a light by your bed that is easy to reach. Do not use any sheets or blankets that are too big for your bed. They should not hang down onto the floor. Have a firm chair that has side arms. You can use this for support while you get dressed. Do not have throw rugs and other things on the floor that can make you trip. What can I do in the kitchen? Clean up any spills right away. Avoid walking on wet floors. Keep items that you use a lot in easy-to-reach places. If you need to reach something above you, use a strong step stool that has a grab bar. Keep electrical cords out of the way. Do not use floor polish or wax that makes  floors slippery. If you must use wax, use non-skid floor wax. Do not have throw rugs and other things on the floor that can make you trip. What can I do with my stairs? Do not leave any items on the stairs. Make sure that there are handrails on both sides of the stairs and use them. Fix handrails that are broken or loose. Make sure that handrails are as long as the stairways. Check any carpeting to make sure that it is firmly attached to the stairs. Fix any carpet that is loose or worn. Avoid having throw rugs at the top or bottom of the stairs. If you do have throw rugs, attach them to the floor with carpet tape. Make sure that you have a light switch at the top of the stairs and the bottom of the stairs. If you do not have them, ask someone to add them for you. What else can I do to help prevent falls? Wear shoes that: Do not have high heels. Have rubber bottoms. Are comfortable and fit you well. Are closed at the toe. Do not wear sandals. If you use a  stepladder: Make sure that it is fully opened. Do not climb a closed stepladder. Make sure that both sides of the stepladder are locked into place. Ask someone to hold it for you, if possible. Clearly mark and make sure that you can see: Any grab bars or handrails. First and last steps. Where the edge of each step is. Use tools that help you move around (mobility aids) if they are needed. These include: Canes. Walkers. Scooters. Crutches. Turn on the lights when you go into a dark area. Replace any light bulbs as soon as they burn out. Set up your furniture so you have a clear path. Avoid moving your furniture around. If any of your floors are uneven, fix them. If there are any pets around you, be aware of where they are. Review your medicines with your doctor. Some medicines can make you feel dizzy. This can increase your chance of falling. Ask your doctor what other things that you can do to help prevent falls. This information is not intended to replace advice given to you by your health care provider. Make sure you discuss any questions you have with your health care provider. Document Released: 07/08/2009 Document Revised: 02/17/2016 Document Reviewed: 10/16/2014 Elsevier Interactive Patient Education  2017 [employer].

## 2022-08-21 NOTE — Progress Notes (Signed)
Subjective:   Kiara Parker is a 71 y.o. female who presents for Medicare Annual (Subsequent) preventive examination.  I connected with  Kiara Parker on 08/21/22 by a telephone enabled telemedicine application and verified that I am speaking with the correct person using two identifiers.   I discussed the limitations of evaluation and management by telemedicine. The patient expressed understanding and agreed to proceed.  Patient location: home  Provider location:   Tele-health-home   Review of Systems     Cardiac Risk Factors include: advanced age (>31mn, >>31women);obesity (BMI >30kg/m2);family history of premature cardiovascular disease;hypertension     Objective:    Today's Vitals   There is no height or weight on file to calculate BMI.     08/21/2022   10:43 AM 08/09/2021   12:55 PM 03/18/2021    1:07 PM 08/06/2020    2:38 PM 11/27/2018   12:16 PM 12/04/2017    2:50 PM 11/13/2016   11:17 AM  Advanced Directives  Does Patient Have a Medical Advance Directive? No No No No No No No  Would patient like information on creating a medical advance directive? Yes (MAU/Ambulatory/Procedural Areas - Information given) No - Patient declined No - Patient declined  No - Patient declined      Current Medications (verified) Outpatient Encounter Medications as of 08/21/2022  Medication Sig   ASPIRIN 81 PO Take 1 tablet by mouth daily.   baclofen (LIORESAL) 10 MG tablet Take 0.5 tablets (5 mg total) by mouth at bedtime.   gabapentin (NEURONTIN) 300 MG capsule TAKE 1 CAPSULE BY MOUTH THREE TIMES A DAY   hydrochlorothiazide (HYDRODIURIL) 12.5 MG tablet Take 1 tablet (12.5 mg total) by mouth daily.   meloxicam (MOBIC) 15 MG tablet Take 1 tablet (15 mg total) by mouth daily.   rosuvastatin (CRESTOR) 5 MG tablet TAKE 1 TABLET (5 MG TOTAL) BY MOUTH 3 (THREE) TIMES A WEEK   No facility-administered encounter medications on file as of 08/21/2022.    Allergies  (verified) Atorvastatin, Clavulanic acid, Erythromycin, Penicillins, Pravastatin, and Zithromax [azithromycin]   History: Past Medical History:  Diagnosis Date   Chronic pain syndrome    Hyperlipidemia    Hypertension    Lumbago    Spinal stenosis    Past Surgical History:  Procedure Laterality Date   COLONOSCOPY WITH PROPOFOL N/A 11/27/2018   Procedure: COLONOSCOPY WITH PROPOFOL;  Surgeon: AJonathon Bellows MD;  [facility]  Service: Gastroenterology;  Laterality: N/A;   TUBAL LIGATION     Family History  Problem Relation Age of Onset   Cancer Mother        breast   Arthritis Mother    Osteoporosis Mother    Breast cancer Mother [phone]  Heart disease Father    Lung disease Father    Cancer Sister        lymphoma   Multiple sclerosis Sister    Cancer Maternal Grandmother        lymphoma   Social History   Socioeconomic History   Marital status: Married    Spouse name: Not on file   Number of children: Not on file   Years of education: Not on file   Highest education level: Not on file  Occupational History   Occupation: retired  Tobacco Use   Smoking status: Never   Smokeless tobacco: Never  Vaping Use   Vaping Use: Never used  Substance and Sexual Activity   Alcohol use: No   Drug use:  No   Sexual activity: Yes    Birth control/protection: None  Other Topics Concern   Not on file  Social History Narrative   Not on file   Social Determinants of Health   Financial Resource Strain: Low Risk  (08/21/2022)   Overall Financial Resource Strain (CARDIA)    Difficulty of Paying Living Expenses: Not hard at all  Food Insecurity: No Food Insecurity (08/21/2022)   Hunger Vital Sign    Worried About Running Out of Food in the Last Year: Never true    Ran Out of Food in the Last Year: Never true  Transportation Needs: No Transportation Needs (08/21/2022)   PRAPARE - Hydrologist (Medical): No    Lack of Transportation  (Non-Medical): No  Physical Activity: Inactive (08/21/2022)   Exercise Vital Sign    Days of Exercise per Week: 0 days    Minutes of Exercise per Session: 0 min  Stress: No Stress Concern Present (08/21/2022)   [place]    Feeling of Stress : Not at all  Social Connections: [place] (08/21/2022)   Social Connection and Isolation Panel [NHANES]    Frequency of Communication with Friends and Family: More than three times a week    Frequency of Social Gatherings with Friends and Family: Three times a week    Attends Religious Services: More than 4 times per year    Active Member of Clubs or Organizations: No    Attends Music therapist: More than 4 times per year    Marital Status: Married    Tobacco Counseling Counseling given: Not Answered   Clinical Intake:  Pre-visit preparation completed: Yes  Pain : No/denies pain     Nutritional Risks: None Diabetes: No  How often do you need to have someone help you when you read instructions, pamphlets, or other written materials from your doctor or pharmacy?: 1 - Never  Diabetic?  no  Interpreter Needed?: No  Information entered by :: Leroy Kennedy LPN   Activities of Daily Living    08/21/2022   10:44 AM 01/25/2022    9:12 AM  In your present state of health, do you have any difficulty performing the following activities:  Hearing? 0 0  Vision? 0 0  Difficulty concentrating or making decisions? 0 0  Walking or climbing stairs? 0 0  Dressing or bathing? 0 0  Doing errands, shopping? 0 0  Preparing Food and eating ? N   Using the Toilet? N   In the past six months, have you accidently leaked urine? Y   Do you have problems with loss of bowel control? N   Managing your Medications? N   Managing your Finances? N   Housekeeping or managing your Housekeeping? N     Patient Care Team: Venita Lick, NP as PCP - General (Nurse  Practitioner)  Indicate any recent Medical Services you may have received from other than [facility] providers in the past year (date may be approximate).     Assessment:   This is a routine wellness examination for [place].  Hearing/Vision screen Hearing Screening - Comments:: No trouble hearing Vision Screening - Comments:: Not up to date [place] issues and exercise activities discussed: Current Exercise Habits: Home exercise routine, Type of exercise: walking, Time (Minutes): 20, Frequency (Times/Week): 5, Weekly Exercise (Minutes/Week): 100, Intensity: Mild   Goals Addressed  This Visit's Progress    Patient Stated   Not on track    08/06/2020, wants to lose 50 pounds     Weight (lb) < 200 lb (90.7 kg)         Depression Screen    08/21/2022   10:56 AM 08/21/2022   10:49 AM 01/25/2022    9:12 AM 08/09/2021   12:55 PM 12/28/2020    8:14 AM 12/13/2020   10:58 AM 10/29/2020   10:38 AM  PHQ 2/9 Scores  PHQ - 2 Score 0 0 0 0 0 0 0  PHQ- 9 Score '1 1 3  1      '$ Fall Risk    08/21/2022   10:43 AM 01/25/2022    9:12 AM 08/09/2021   12:58 PM 08/09/2021   12:52 PM 12/13/2020   10:57 AM  Fall Risk   Falls in the past year? 0 0 0 0 0  Number falls in past yr: 0 0 0 0   Injury with Fall? 0 0 0 0   Risk for fall due to :  No Fall Risks     Follow up Falls evaluation completed;Education provided;Falls prevention discussed Education provided Falls evaluation completed;Education provided;Falls prevention discussed Falls evaluation completed;Education provided;Falls prevention discussed     FALL RISK PREVENTION PERTAINING TO THE HOME:  Any stairs in or around the home? Yes   has elevator in building  If so, are there any without handrails? No  Home free of loose throw rugs in walkways, pet beds, electrical cords, etc? No  Adequate lighting in your home to reduce risk of falls? No   ASSISTIVE DEVICES UTILIZED TO PREVENT FALLS:  Life alert? No  Use of a cane,  walker or w/c? No  Grab bars in the bathroom? No  Shower chair or bench in shower? No  Elevated toilet seat or a handicapped toilet? No   TIMED UP AND GO:  Was the test performed? No .    Cognitive Function:        08/21/2022   10:46 AM 08/06/2020    2:42 PM  6CIT Screen  What Year? 0 points 0 points  What month? 0 points 0 points  What time? 0 points 0 points  Count back from 20 2 points 2 points  Months in reverse 0 points 0 points  Repeat phrase 0 points 0 points  Total Score 2 points 2 points    Immunizations Immunization History  Administered Date(s) Administered   Fluad Quad(high Dose 65+) 07/06/2021   Influenza, High Dose Seasonal PF 07/24/2018, 06/27/2019, 07/19/2021   Influenza,inj,Quad PF,6+ Mos 09/12/2016   Influenza-Unspecified 07/02/2020   PFIZER Comirnaty(Gray Top)Covid-19 Tri-Sucrose Vaccine 02/07/2021   PFIZER(Purple Top)SARS-COV-2 Vaccination 11/18/2019, 12/09/2019, 07/06/2020   Pfizer Covid-19 Vaccine Bivalent Booster 21yr & up 06/17/2021   Pneumococcal Conjugate-13 07/24/2018   Pneumococcal Polysaccharide-23 08/23/2019   Tdap 09/12/2016    TDAP status: Up to date  Flu Vaccine status: Due, Education has been provided regarding the importance of this vaccine. Advised may receive this vaccine at local pharmacy or Health Dept. Aware to provide a copy of the vaccination record if obtained from local pharmacy or Health Dept. Verbalized acceptance and understanding.  Pneumococcal vaccine status: Up to date  Covid-19 vaccine status: Information provided on how to obtain vaccines.   Qualifies for Shingles Vaccine? No   Zostavax completed No   Shingrix Completed?: No.    Education has been provided regarding the importance of this vaccine. Patient has been advised  to call insurance company to determine out of pocket expense if they have not yet received this vaccine. Advised may also receive vaccine at local pharmacy or Health Dept. Verbalized acceptance  and understanding.  Screening Tests Health Maintenance  Topic Date Due   INFLUENZA VACCINE  04/25/2022   COVID-19 Vaccine (6 - 2023-24 season) 09/06/2022 (Originally 05/26/2022)   Zoster Vaccines- Shingrix (1 of 2) 11/21/2022 (Originally 08/15/1970)   MAMMOGRAM  04/20/2023   Medicare Annual Wellness (AWV)  08/22/2023   COLONOSCOPY (Pts 45-19yr Insurance coverage will need to be confirmed)  11/27/2023   Pneumonia Vaccine 70 Years old  Completed   DEXA SCAN  Completed   Hepatitis C Screening  Completed   HPV VACCINES  Aged Out    Health Maintenance  Health Maintenance Due  Topic Date Due   INFLUENZA VACCINE  04/25/2022    Colorectal cancer screening: Type of screening: Colonoscopy. Completed 2020. Repeat every 5 years  Mammogram status: Ordered  . Pt provided with contact info and advised to call to schedule appt.   Bone Density status: Completed 2021. Results reflect: Bone density results: NORMAL. Repeat every 0 years.  Lung Cancer Screening: (Low Dose CT Chest recommended if Age 70-80years, 30 pack-year currently smoking OR have quit w/in 15years.) does not qualify.   Lung Cancer Screening Referral:   Additional Screening:  Hepatitis C Screening: does not qualify; Completed 2022  Vision Screening: Recommended annual ophthalmology exams for early detection of glaucoma and other disorders of the eye. Is the patient up to date with their annual eye exam?  No  Who is the provider or what is the name of the office in which the patient attends annual eye exams? [facility] If pt is not established with a provider, would they like to be referred to a provider to establish care? No .   Dental Screening: Recommended annual dental exams for proper oral hygiene  Community Resource Referral / Chronic Care Management: CRR required this visit?  No   CCM required this visit?  No      Plan:     I have personally reviewed and noted the following in the patient's chart:   Medical  and social history Use of alcohol, tobacco or illicit drugs  Current medications and supplements including opioid prescriptions. Patient is not currently taking opioid prescriptions. Functional ability and status Nutritional status Physical activity Advanced directives List of other physicians Hospitalizations, surgeries, and ER visits in previous 12 months Vitals Screenings to include cognitive, depression, and falls Referrals and appointments  In addition, I have reviewed and discussed with patient certain preventive protocols, quality metrics, and best practice recommendations. A written personalized care plan for preventive services as well as general preventive health recommendations were provided to patient.     JLeroy Kennedy LPN   [id number]  Nurse Notes:

## 2022-08-21 NOTE — Telephone Encounter (Signed)
  Chief Complaint: Cold s/s, Burning with urination sometimes. Symptoms: Runny nose, mild cough, sneezing. Burning with urination Frequency: 10 days Pertinent Negatives: Patient denies fever has resolved, no chest pain or SOB Disposition: '[]'$ ED /'[]'$ Urgent Care (no appt availability in office) / '[x]'$ Appointment(In office/virtual)/ '[]'$  [place] [facility]/ '[]'$ Home Care/ '[]'$ Refused Recommended Disposition /'[]'$ [place] [facility]/ '[]'$  Follow-up with PCP Additional Notes: Pt has had mild cold s/s for about 10 days. Pt also has burning with urination at times. Pt used monistat with some relief. PT is unsure if this is a yeast infection or a UTI.    Summary: cold symtoms and vaginal burning   Pt has a bad cold and cough and asked if she can get an antibiotic/ stated she has been sick for about 10 days  / pt also mentioned she has vaginal burning / please advise / pt was already scheduled for tomorrow        Reason for Disposition  [1] Nasal discharge AND [2] present > 10 days  Answer Assessment - Initial Assessment Questions 1. ONSET: "When did the cough begin?"      10 days 2. SEVERITY: "How bad is the cough today?"      Moderate 3. SPUTUM: "Describe the color of your sputum" (none, dry cough; clear, white, yellow, green)     none 4. HEMOPTYSIS: "Are you coughing up any blood?" If so ask: "How much?" (flecks, streaks, tablespoons, etc.)     no 5. DIFFICULTY BREATHING: "Are you having difficulty breathing?" If Yes, ask: "How bad is it?" (e.g., mild, moderate, severe)    - MILD: No SOB at rest, mild SOB with walking, speaks normally in sentences, can lie down, no retractions, pulse < 100.    - MODERATE: SOB at rest, SOB with minimal exertion and prefers to sit, cannot lie down flat, speaks in phrases, mild retractions, audible wheezing, pulse 100-120.    - SEVERE: Very SOB at rest, speaks in single words, struggling to breathe, sitting hunched forward, retractions, pulse > 120      none 6.  FEVER: "Do you have a fever?" If Yes, ask: "What is your temperature, how was it measured, and when did it start?"     Had one for 1 day 1 week ago 7. CARDIAC HISTORY: "Do you have any history of heart disease?" (e.g., heart attack, congestive heart failure)      no 8. LUNG HISTORY: "Do you have any history of lung disease?"  (e.g., pulmonary embolus, asthma, emphysema)     no 9. PE RISK FACTORS: "Do you have a history of blood clots?" (or: recent major surgery, recent prolonged travel, bedridden)     no 10. OTHER SYMPTOMS: "Do you have any other symptoms?" (e.g., runny nose, wheezing, chest pain)       Runny nose, sneezing 11. PREGNANCY: "Is there any chance you are pregnant?" "When was your last menstrual period?"        12. TRAVEL: "Have you traveled out of the country in the last month?" (e.g., travel history, exposures)  Protocols used: Cough - Acute Non-Productive-A-AH

## 2022-08-22 ENCOUNTER — Ambulatory Visit (INDEPENDENT_AMBULATORY_CARE_PROVIDER_SITE_OTHER): Payer: Medicare HMO | Admitting: Nurse Practitioner

## 2022-08-22 ENCOUNTER — Encounter: Payer: Self-pay | Admitting: Nurse Practitioner

## 2022-08-22 DIAGNOSIS — E782 Mixed hyperlipidemia: Secondary | ICD-10-CM

## 2022-08-22 DIAGNOSIS — M791 Myalgia, unspecified site: Secondary | ICD-10-CM

## 2022-08-22 DIAGNOSIS — R300 Dysuria: Secondary | ICD-10-CM

## 2022-08-22 DIAGNOSIS — R7303 Prediabetes: Secondary | ICD-10-CM

## 2022-08-22 DIAGNOSIS — E559 Vitamin D deficiency, unspecified: Secondary | ICD-10-CM

## 2022-08-22 DIAGNOSIS — G894 Chronic pain syndrome: Secondary | ICD-10-CM | POA: Diagnosis not present

## 2022-08-22 DIAGNOSIS — R051 Acute cough: Secondary | ICD-10-CM

## 2022-08-22 DIAGNOSIS — T466X5A Adverse effect of antihyperlipidemic and antiarteriosclerotic drugs, initial encounter: Secondary | ICD-10-CM

## 2022-08-22 DIAGNOSIS — I10 Essential (primary) hypertension: Secondary | ICD-10-CM

## 2022-08-22 DIAGNOSIS — Z6839 Body mass index (BMI) 39.0-39.9, adult: Secondary | ICD-10-CM

## 2022-08-22 LAB — URINALYSIS, ROUTINE W REFLEX MICROSCOPIC
Bilirubin, UA: NEGATIVE
Glucose, UA: NEGATIVE
Ketones, UA: NEGATIVE
Nitrite, UA: NEGATIVE
Protein,UA: NEGATIVE
RBC, UA: NEGATIVE
Specific Gravity, UA: 1.02 (ref 1.005–1.030)
Urobilinogen, Ur: 0.2 mg/dL (ref 0.2–1.0)
pH, UA: 5 (ref 5.0–7.5)

## 2022-08-22 LAB — MICROSCOPIC EXAMINATION: Bacteria, UA: NONE SEEN

## 2022-08-22 LAB — WET PREP FOR TRICH, YEAST, CLUE
Clue Cell Exam: NEGATIVE
Trichomonas Exam: NEGATIVE
Yeast Exam: NEGATIVE

## 2022-08-22 LAB — BAYER DCA HB A1C WAIVED: HB A1C (BAYER DCA - WAIVED): 5.9 % — ABNORMAL HIGH (ref 4.8–5.6)

## 2022-08-22 MED ORDER — BENZONATATE 100 MG PO CAPS: 100.0000 mg | ORAL_CAPSULE | Freq: Three times a day (TID) | ORAL | 0 refills | Status: DC | PRN

## 2022-08-22 MED ORDER — PREDNISONE 20 MG PO TABS: 40.0000 mg | ORAL_TABLET | Freq: Every day | ORAL | 0 refills | Status: AC

## 2022-08-22 NOTE — Assessment & Plan Note (Signed)
Ongoing with A1C 5.9% today, stable, she continues to be between 5.6 to 6.3% on review.  Will adjust plan of care and add medication as needed.  Continue diet focus at this time.  Urine ALB 21 Feb 2022, consider ACE or ARB in future.

## 2022-08-22 NOTE — Progress Notes (Signed)
BP 130/68 (BP Location: Left Arm, Patient Position: Sitting, Cuff Size: Large)   Pulse 75   Temp 97.9 F (36.6 C) (Oral)   Resp (!) 95   Ht 5' 4.02" (1.626 m)   Wt 217 lb (98.4 kg)   LMP  (LMP Unknown)   BMI 37.23 kg/m    Subjective:    Patient ID: Kiara Parker, female    DOB: Jul 10, 1951, 71 y.o.   MRN: [id number]  HPI: Kiara Parker is a 71 y.o. female  Chief Complaint  Patient presents with   Hypertension   Hyperlipidemia   Back Pain   Vit D Deficiency   Prediabetes   PREDIABETES: A1c last check 5.7% May.  Diet focused at this time.  She has history of low Vitamin D.  Treated with high dose Vit D initially, but now taking 1000 units daily. No recent falls or fractures. Polydipsia/polyuria: no Visual disturbance: no Chest pain: no Paresthesias: no  HYPERTENSION / HYPERLIPIDEMIA Taking HCTZ and Crestor daily.   Satisfied with current treatment? yes Duration of hypertension: chronic BP monitoring frequency: not checking BP range:  BP medication side effects: no Duration of hyperlipidemia: years Cholesterol medication side effects: no Cholesterol supplements: none Medication compliance: good compliance Aspirin: yes Recent stressors: no Recurrent headaches: no Visual changes: no Palpitations: no Dyspnea: no -- recently was sick and had some with coughing, but this has improved Chest pain: no Lower extremity edema: occasional to feet Dizzy/lightheaded: no   URINARY SYMPTOMS & COUGH Started with symptoms one week ago with dysuria.  She was sick with URI at time, continues to have a mild cough and it is keeping her awake. Dysuria: burning Urinary frequency: yes Urgency: yes Small volume voids: yes Symptom severity: yes Urinary incontinence:  occasional dribbling with coughing Foul odor: no Hematuria: no Abdominal pain: no Back pain: no Suprapubic pain/pressure: no Flank pain: no Fever:  no Vomiting: no Status: stable Previous urinary tract  infection: yes Recurrent urinary tract infection: no Sexual activity: No sexually active Treatments attempted: increasing fluids     BACK PAIN Ongoing issue.  Sees neurosurgery at [facility] Neurosurgery as needed and received injections, last 11/05/20 -- Dr. Brien Few recently quit.  Is taking Gabapentin 3 times a day + Baclofen at bedtime + Meloxicam daily.     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  Bone density on 10/09/19 was normal. Duration: years Mechanism of injury: no trauma Location: L>R, midline and low back + left hip Onset: gradual Severity: stable Aggravating factors: movement, walking and bending -- walking especially Alleviating factors: epidural, Gabapentin Status: stable Treatments attempted: rest, heat and aleve, medications noted above -- epidural  Relief with NSAIDs?: moderate Nighttime pain: none Paresthesias / decreased sensation:  no Bowel / bladder incontinence:  no Fevers:  no Dysuria / urinary frequency:  no     08/21/2022   10:56 AM 08/21/2022   10:49 AM 01/25/2022    9:12 AM 08/09/2021   12:55 PM 12/28/2020    8:14 AM  Depression screen PHQ 2/9  Decreased Interest 0 0 0 0 0  Down, Depressed, Hopeless 0 0 0 0 0  PHQ - 2 Score 0 0 0 0 0  Altered sleeping '1 1 1  '$ 0  Tired, decreased energy 0 0 1  1  Change in appetite 0 0 1  0  Feeling bad or failure  about yourself  0 0 0  0  Trouble concentrating 0 0 0  0  Moving slowly or fidgety/restless 0 0 0  0  Suicidal thoughts 0 0 0  0  PHQ-9 Score '1 1 3  1  '$ Difficult doing work/chores Not difficult at all Not difficult at all Not difficult at all         08/22/2022   10:46 AM 01/25/2022    9:12 AM 12/28/2020    8:15 AM  GAD 7 : Generalized Anxiety Score  Nervous, Anxious, on Edge 0 0 0  Control/stop worrying 0 0 0  Worry too much - different things 0 0 0   Trouble relaxing 0 0 0  Restless 0 0 0  Easily annoyed or irritable 0 0 0  Afraid - awful might happen 0 0 0  Total GAD 7 Score 0 0 0  Anxiety Difficulty Not difficult at all Not difficult at all    Relevant past medical, surgical, family and social history reviewed and updated as indicated. Interim medical history since our last visit reviewed. Allergies and medications reviewed and updated.  Review of Systems  Constitutional:  Negative for activity change, appetite change, diaphoresis, fatigue and fever.  Respiratory:  Negative for cough, chest tightness and shortness of breath.   Cardiovascular:  Negative for chest pain, palpitations and leg swelling.  Gastrointestinal: Negative.   Endocrine: Negative.   Musculoskeletal:  Positive for back pain.  Neurological: Negative.   Psychiatric/Behavioral: Negative.      Per HPI unless specifically indicated above     Objective:    BP 130/68 (BP Location: Left Arm, Patient Position: Sitting, Cuff Size: Large)   Pulse 75   Temp 97.9 F (36.6 C) (Oral)   Resp (!) 95   Ht 5' 4.02" (1.626 m)   Wt 217 lb (98.4 kg)   LMP  (LMP Unknown)   BMI 37.23 kg/m   Wt Readings from Last 3 Encounters:  08/22/22 217 lb (98.4 kg)  01/25/22 222 lb 6.4 oz (100.9 kg)  07/18/21 226 lb 6.4 oz (102.7 kg)    Physical Exam Vitals and nursing note reviewed.  Constitutional:      General: She is awake. She is not in acute distress.    Appearance: She is well-developed. She is obese. She is not ill-appearing.  HENT:     Head: Normocephalic.     Right Ear: Hearing normal.     Left Ear: Hearing normal.  Eyes:     General: Lids are normal.        Right eye: No discharge.        Left eye: No discharge.     Conjunctiva/sclera: Conjunctivae normal.     Pupils: Pupils are equal, round, and reactive to light.  Neck:     Vascular: No carotid bruit.  Cardiovascular:     Rate and Rhythm: Normal rate and regular rhythm.     Heart sounds: Normal heart  sounds. No murmur heard.    No gallop.  Pulmonary:     Effort: Pulmonary effort is normal. No accessory muscle usage or respiratory distress.     Breath sounds: Normal breath sounds.  Abdominal:     General: Bowel sounds are normal.     Palpations: Abdomen is soft.  Musculoskeletal:     Cervical back: Normal range of motion and neck supple.     Lumbar back: No swelling, deformity, spasms, tenderness or bony tenderness. Decreased range of motion. Negative right straight leg  raise test and negative left straight leg raise test.     Right lower leg: No edema.     Left lower leg: No edema.     Comments: Antalgic gait.  Skin:    General: Skin is warm and dry.  Neurological:     Mental Status: She is alert and oriented to person, place, and time.  Psychiatric:        Attention and Perception: Attention normal.        Mood and Affect: Mood normal.        Behavior: Behavior normal. Behavior is cooperative.        Thought Content: Thought content normal.        Judgment: Judgment normal.     Results for orders placed or performed in visit on 08/22/22  WET PREP FOR [place], YEAST, CLUE   Specimen: Genital   Genital  Result Value Ref Range   Trichomonas Exam Negative Negative   Yeast Exam Negative Negative   Clue Cell Exam Negative Negative  Microscopic Examination   Genital  Result Value Ref Range   WBC, UA 0-5 0 - 5 /hpf   RBC, Urine 0-2 0 - 2 /hpf   Epithelial Cells (non renal) 0-10 0 - 10 /hpf   Bacteria, UA None seen None seen/Few  Bayer DCA Hb A1c Waived  Result Value Ref Range   HB A1C (BAYER DCA - WAIVED) 5.9 (H) 4.8 - 5.6 %  Urinalysis, Routine w reflex microscopic  Result Value Ref Range   Specific Gravity, UA 1.020 1.005 - 1.030   pH, UA 5.0 5.0 - 7.5   Color, UA Yellow Yellow   Appearance Ur Clear Clear   Leukocytes,UA Trace (A) Negative   Protein,UA Negative Negative/Trace   Glucose, UA Negative Negative   Ketones, UA Negative Negative   RBC, UA Negative  Negative   Bilirubin, UA Negative Negative   Urobilinogen, Ur 0.2 0.2 - 1.0 mg/dL   Nitrite, UA Negative Negative   Microscopic Examination See below:       Assessment & Plan:   Problem List Items Addressed This Visit       Cardiovascular and Mediastinum   Hypertension (Chronic)    Chronic, stable,  BP at goal on recheck today. Continue low dose HCTZ and adjust as needed. Recommend she monitor BP at least a few mornings a week at home and document.  DASH diet at home.  Labs today: CMP.  Urine ALB 30 on check May 2023, may benefit from ACE or ARB in future.  Return in 6 months.       Relevant Orders   Comprehensive metabolic panel     Other   Chronic pain syndrome (Chronic)    Chronic, stable with Gabapentin, Baclofen, and Meloxicam.  Recommend regular exercise and weight loss.  Consider physical therapy or ortho consult if worsening pain.      Relevant Medications   predniSONE (DELTASONE) 20 MG tablet   Hyperlipidemia (Chronic)    Chronic, ongoing.  Is tolerating Crestor on 3 day week schedule.  Will continue this and adjust as needed.  Obtain lipid panel today.      Relevant Orders   Comprehensive metabolic panel   Lipid Panel w/o Chol/HDL Ratio   Morbid obesity (HCC) - Primary (Chronic)    BMI 37.23 with HTN and Prediabetes.  Recommended eating smaller high protein, low fat meals more frequently and exercising 30 mins a day 5 times a week with a goal of 10-15lb weight  loss in the next 3 months. Patient voiced their understanding and motivation to adhere to these recommendations.       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   If symptoms present then consider weekly dosing or change to Repatha.  Lipid panel today.      Relevant Orders   Comprehensive metabolic panel   Prediabetes (Chronic)    Ongoing with A1C 5.9% today, stable, she continues to be between 5.6 to 6.3% on review.  Will  adjust plan of care and add medication as needed.  Continue diet focus at this time.  Urine ALB 21 Feb 2022, consider ACE or ARB in future.      Relevant Orders   Bayer DCA Hb A1c Waived (Completed)   Vitamin D deficiency (Chronic)    Chronic, ongoing.  Continue daily supplement and recheck Vit D level today, adjust dosing as needed.  DEXA with normal findings.      Relevant Orders   VITAMIN D 25 Hydroxy (Vit-D Deficiency, Fractures)   Other Visit Diagnoses     Dysuria       UA overall reassuring with no concern for UTI & wet prep negative -- will alert patient and monitor symptoms, if worsening return to office.   Relevant Orders   Urinalysis, Routine w reflex microscopic (Completed)   WET PREP FOR TRICH, YEAST, CLUE (Completed)   Acute cough       Acute and mild cough ongoing for over a week, will send in Prednisone 40 MG daily got 5 days and Tessalon.  Return to office if worsening or ongoing.        Follow up plan: Return in about 5 months (around 01/19/2023) for Annual physical -- after 01/17/23.

## 2022-08-22 NOTE — Assessment & Plan Note (Signed)
BMI 37.23 with HTN and Prediabetes.  Recommended eating smaller high protein, low fat meals more frequently and exercising 30 mins a day 5 times a week with a goal of 10-15lb weight loss in the next 3 months. Patient voiced their understanding and motivation to adhere to these recommendations.

## 2022-08-22 NOTE — Progress Notes (Signed)
Please let Kiara Parker know her urine and wet prep both returned negative showing no infection, so she may be improving this on her own.  No medications needed at this time with negative testing, but if ongoing or worsening symptoms please let me know:)

## 2022-08-22 NOTE — Assessment & Plan Note (Signed)
Chronic, ongoing.  Is tolerating Crestor on 3 day week schedule.  Will continue this and adjust as needed.  Obtain lipid panel today.

## 2022-08-22 NOTE — Assessment & Plan Note (Signed)
Chronic, stable,  BP at goal on recheck today. Continue low dose HCTZ and adjust as needed. Recommend she monitor BP at least a few mornings a week at home and document.  DASH diet at home.  Labs today: CMP.  Urine ALB 30 on check May 2023, may benefit from ACE or ARB in future.  Return in 6 months.

## 2022-08-22 NOTE — Assessment & Plan Note (Signed)
Chronic, stable with Gabapentin, Baclofen, and Meloxicam.  Recommend regular exercise and weight loss.  Consider physical therapy or ortho consult if worsening pain.

## 2022-08-22 NOTE — Assessment & Plan Note (Signed)
At this time is tolerating Crestor on 3 day a week schedule, adjust this as needed.   If symptoms present then consider weekly dosing or change to Repatha.  Lipid panel today.

## 2022-08-22 NOTE — Assessment & Plan Note (Addendum)
Chronic, ongoing.  Continue daily supplement and recheck Vit D level today, adjust dosing as needed.  DEXA with normal findings.

## 2022-08-23 ENCOUNTER — Other Ambulatory Visit: Payer: Self-pay | Admitting: Nurse Practitioner

## 2022-08-23 LAB — COMPREHENSIVE METABOLIC PANEL
ALT: 20 IU/L (ref 0–32)
AST: 22 IU/L (ref 0–40)
Albumin/Globulin Ratio: 1.6 (ref 1.2–2.2)
Albumin: 4.4 g/dL (ref 3.9–4.9)
Alkaline Phosphatase: 81 IU/L (ref 44–121)
BUN/Creatinine Ratio: 12 (ref 12–28)
BUN: 11 mg/dL (ref 8–27)
Bilirubin Total: 0.5 mg/dL (ref 0.0–1.2)
CO2: 25 mmol/L (ref 20–29)
Calcium: 9.7 mg/dL (ref 8.7–10.3)
Chloride: 99 mmol/L (ref 96–106)
Creatinine, Ser: 0.91 mg/dL (ref 0.57–1.00)
Globulin, Total: 2.8 g/dL (ref 1.5–4.5)
Glucose: 108 mg/dL — ABNORMAL HIGH (ref 70–99)
Potassium: 4.2 mmol/L (ref 3.5–5.2)
Sodium: 140 mmol/L (ref 134–144)
Total Protein: 7.2 g/dL (ref 6.0–8.5)
eGFR: 68 mL/min/{1.73_m2} (ref >59)

## 2022-08-23 LAB — LIPID PANEL W/O CHOL/HDL RATIO
Cholesterol, Total: 211 mg/dL — ABNORMAL HIGH (ref 100–199)
HDL: 46 mg/dL (ref >39)
LDL Chol Calc (NIH): 137 mg/dL — ABNORMAL HIGH (ref 0–99)
Triglycerides: 156 mg/dL — ABNORMAL HIGH (ref 0–149)
VLDL Cholesterol Cal: 28 mg/dL (ref 5–40)

## 2022-08-23 LAB — VITAMIN D 25 HYDROXY (VIT D DEFICIENCY, FRACTURES): Vit D, 25-Hydroxy: 29 ng/mL — ABNORMAL LOW (ref 30.0–100.0)

## 2022-08-23 MED ORDER — EZETIMIBE 10 MG PO TABS: 10.0000 mg | ORAL_TABLET | Freq: Every day | ORAL | 3 refills | Status: DC

## 2022-08-23 NOTE — Progress Notes (Signed)
Good morning, please let Kiara Parker know her labs have returned: - Kidney and liver function remain normal. - Vitamin D level remains a little low, please ensure you are taking Vitamin D3 2000 units daily for bone health. - Cholesterol levels remain above goal, continue Rosuvastatin 3 days a week, ensure to take on this schedule and I am sending in a medication called Zetia to take with this to help further lower levels (this is not a statin and works a little differently) -- if any side effects with this let me know.  You can take Rosuvastatin and Zetia at same time.  Any questions? Keep being amazing!!  Thank you for allowing me to participate in your care.  I appreciate you. Kindest regards, Henlee Donovan

## 2022-08-25 ENCOUNTER — Ambulatory Visit: Payer: Self-pay | Admitting: *Deleted

## 2022-08-25 NOTE — Telephone Encounter (Signed)
Reason for Disposition . [1] Follow-up call to recent contact AND [2] information only call, no triage required  Answer Assessment - Initial Assessment Questions 1. REASON FOR CALL or QUESTION: "What is your reason for calling today?" or "How can I best help you?" or "What question do you have that I can help answer?"     Pt returned call and was given her lab result message, wet prep results.   She just set up her MyChart account so answered several questions for her.  Protocols used: Information Only Call - No Triage-A-AH

## 2022-12-29 IMAGING — CT CT HEAD W/O CM
4 series · 17 of 47 positions shown, 19 images · non-contrast
Comparison: None.

CLINICAL DATA: Headache, hit head on nightstand

EXAM:
CT HEAD WITHOUT CONTRAST
TECHNIQUE: Contiguous axial images were obtained from the base of the skull
through the vertex without intravenous contrast.

[Series 2: head wo · axial · 0.42mm/px · z∈[-72,+33]mm · 7 of 29 slices shown, 9 images]
[im 4/29  brain]
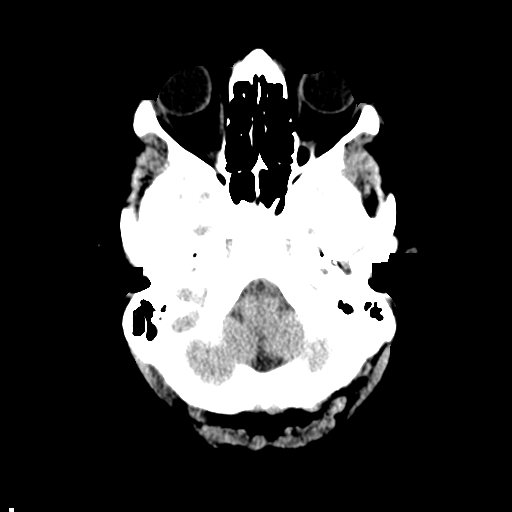
[im 4/29  bone]
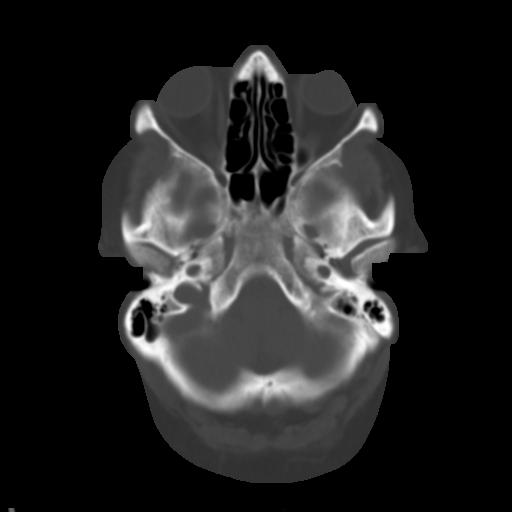
[im 8/29  brain]
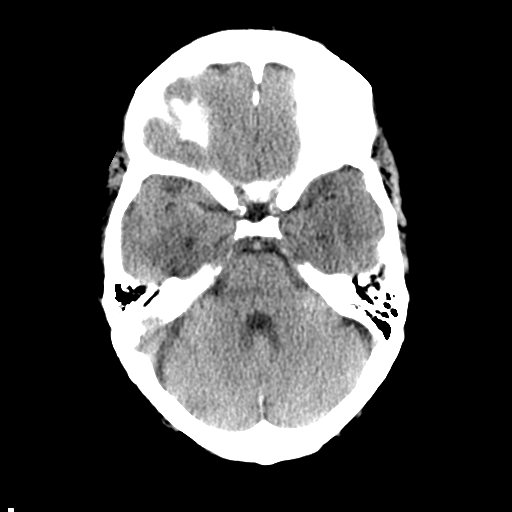
[im 11/29  brain]
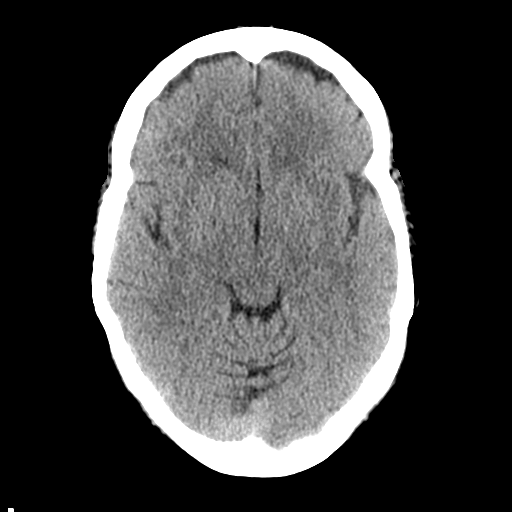
[im 15/29  brain]
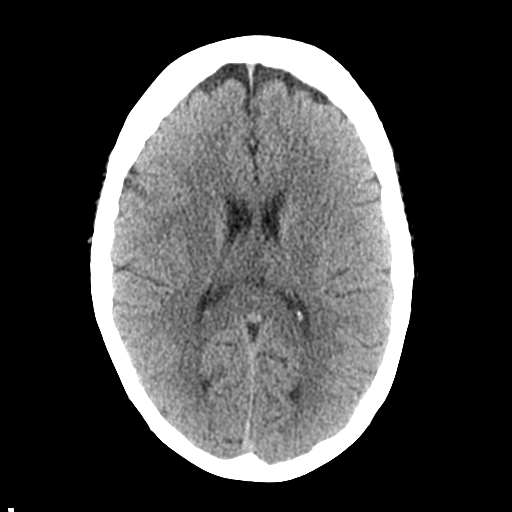
[im 18/29  brain]
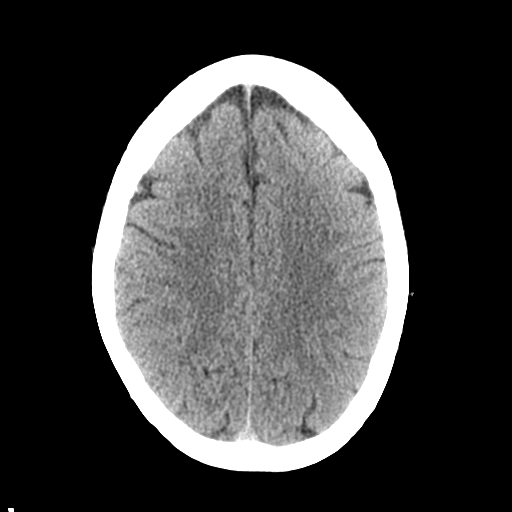
[im 18/29  bone]
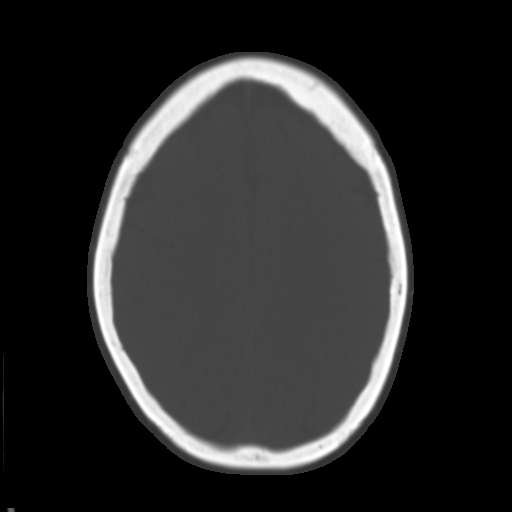
[im 22/29  brain]
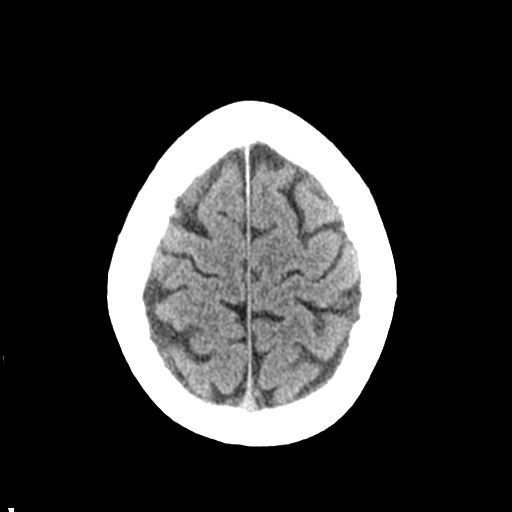
[im 25/29  brain]
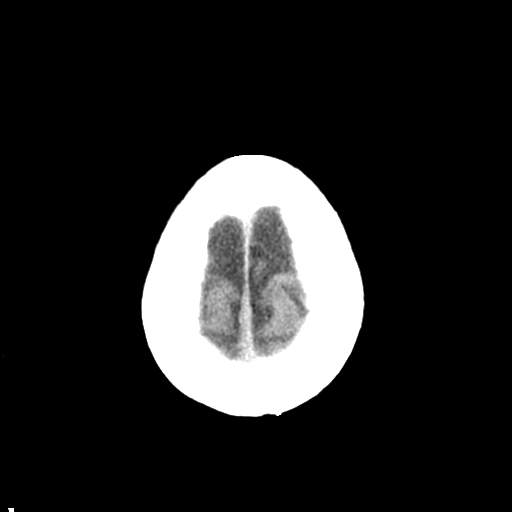

[Series 3: head bone · axial · 0.42mm/px · z∈[-73,-23]mm · 4 of 73 slices shown]
[im 8/73  bone]
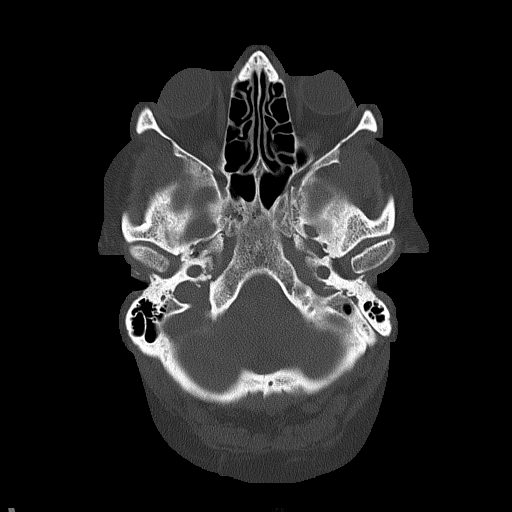
[im 15/73  bone]
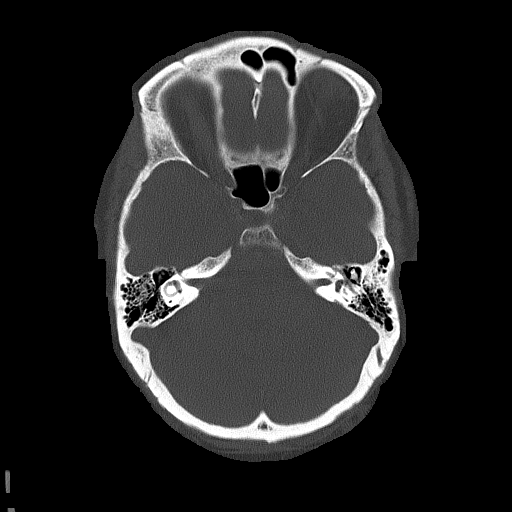
[im 22/73  bone]
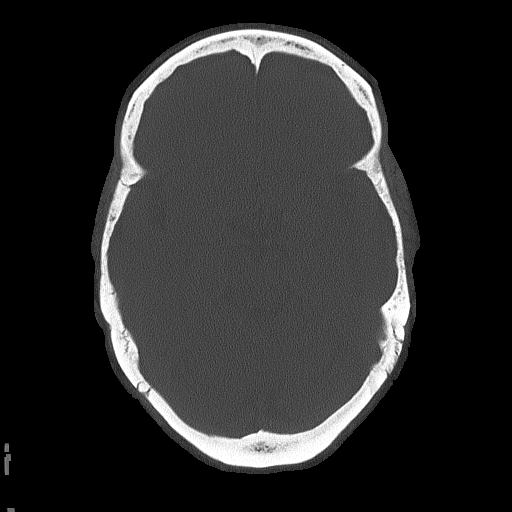
[im 33/73  bone]
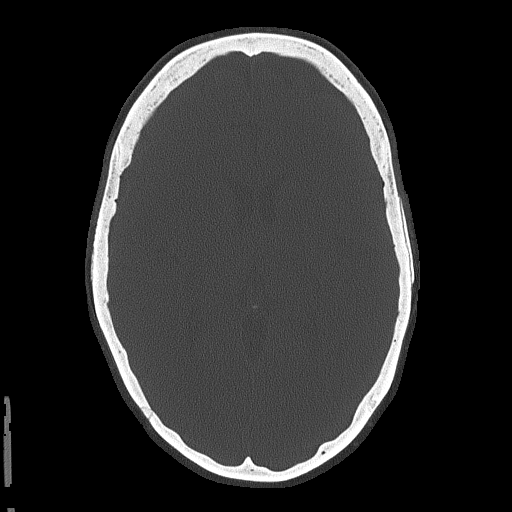

[Series 4: coronal soft tissue · coronal · 0.29mm/px · 3 of 67 slices shown]
[im 23/67  brain]
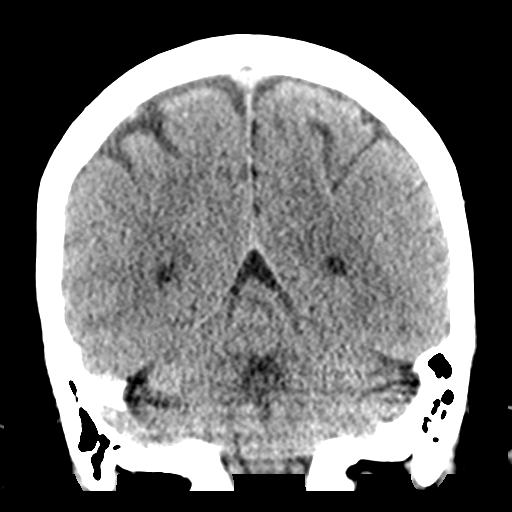
[im 30/67  brain]
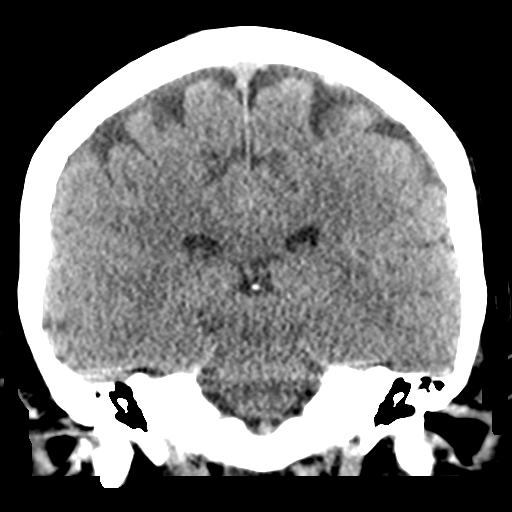
[im 37/67  brain]
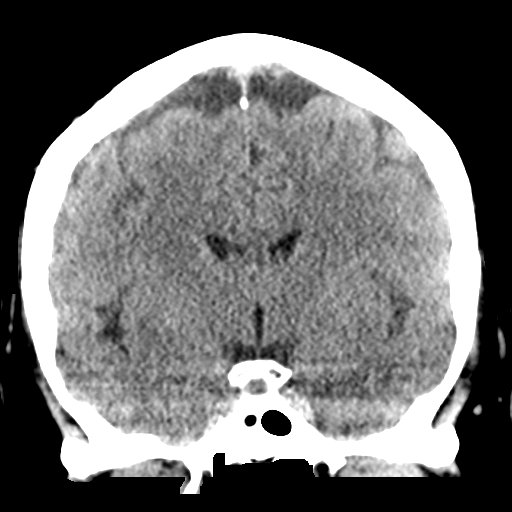

[Series 5: sagittal soft tissue · sagittal · 0.30mm/px · 3 of 51 slices shown]
[im 17/51  brain]
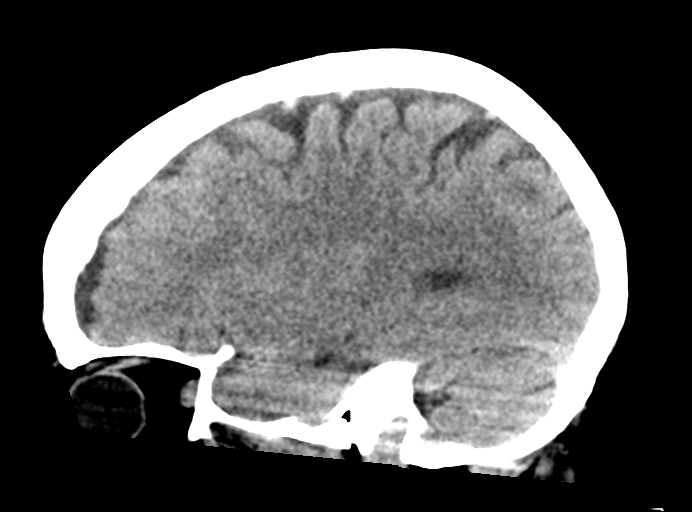
[im 26/51  brain]
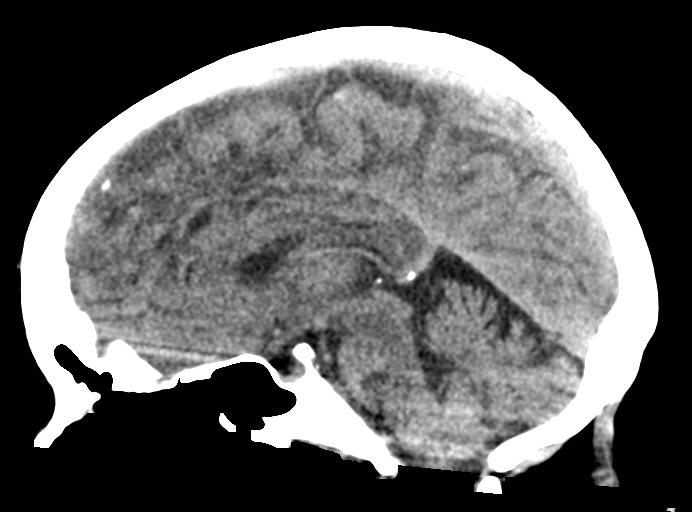
[im 34/51  brain]
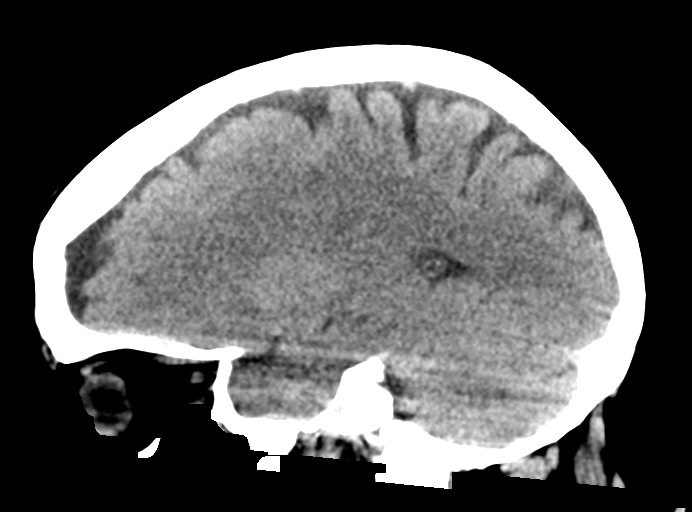

[17 of 47 positions shown; findings below may reference images not displayed]

FINDINGS: Brain: There is no acute intracranial hemorrhage, mass effect, or
edema. Gray-white differentiation is preserved. There is no
extra-axial fluid collection. Ventricles and sulci are within normal
limits in size and configuration.

Vascular: There is mild atherosclerotic calcification at the skull
base.

Skull: Calvarium is unremarkable.

Sinuses/Orbits: No acute finding.

Other: None.
IMPRESSION: No evidence of acute intracranial injury.

## 2023-01-22 ENCOUNTER — Encounter: Payer: Medicare HMO | Admitting: Nurse Practitioner

## 2023-02-03 NOTE — Patient Instructions (Incomplete)
Please call to schedule your mammogram and/or bone density: [facility]  Address: [address], [place], [place] [address] Phone: [phone]  [facility] [place] [address] [place],  [place]  [address] Phone: [phone]    Healthy Eating, Adult Healthy eating may help you get and keep a healthy body weight, reduce the risk of chronic disease, and live a long and productive life. It is important to follow a healthy eating pattern. Your nutritional and calorie needs should be met mainly by different nutrient-rich foods. What are tips for following this plan? Reading food labels Read labels and choose the following: Reduced or low sodium products. Juices with 100% fruit juice. Foods with low saturated fats (<3 g per serving) and high polyunsaturated and monounsaturated fats. Foods with whole grains, such as whole wheat, cracked wheat, brown rice, and wild rice. Whole grains that are fortified with folic acid. This is recommended for females who are pregnant or who want to become pregnant. Read labels and do not eat or drink the following: Foods or drinks with added sugars. These include foods that contain brown sugar, corn sweetener, corn syrup, dextrose, fructose, glucose, high-fructose corn syrup, honey, invert sugar, lactose, malt syrup, maltose, molasses, raw sugar, sucrose, trehalose, or turbinado sugar. Limit your intake of added sugars to less than 10% of your total daily calories. Do not eat more than the following amounts of added sugar per day: 6 teaspoons (25 g) for females. 9 teaspoons (38 g) for males. Foods that contain processed or refined starches and grains. Refined grain products, such as white flour, degermed cornmeal, white bread, and white rice. Shopping Choose nutrient-rich snacks, such as vegetables, whole fruits, and nuts. Avoid high-calorie and high-sugar snacks, such as potato chips,  fruit snacks, and candy. Use oil-based dressings and spreads on foods instead of solid fats such as butter, margarine, sour cream, or cream cheese. Limit pre-made sauces, mixes, and "instant" products such as flavored rice, instant noodles, and ready-made pasta. Try more plant-protein sources, such as tofu, tempeh, black beans, edamame, lentils, nuts, and seeds. Explore eating plans such as the Mediterranean diet or vegetarian diet. Try heart-healthy dips made with beans and healthy fats like hummus and guacamole. Vegetables go great with these. Cooking Use oil to saut or stir-fry foods instead of solid fats such as butter, margarine, or lard. Try baking, boiling, grilling, or broiling instead of frying. Remove the fatty part of meats before cooking. Steam vegetables in water or broth. Meal planning  At meals, imagine dividing your plate into fourths: One-half of your plate is fruits and vegetables. One-fourth of your plate is whole grains. One-fourth of your plate is protein, especially lean meats, poultry, eggs, tofu, beans, or nuts. Include low-fat dairy as part of your daily diet. Lifestyle Choose healthy options in all settings, including home, work, school, restaurants, or stores. Prepare your food safely: Wash your hands after handling raw meats. Where you prepare food, keep surfaces clean by regularly washing with hot, soapy water. Keep raw meats separate from ready-to-eat foods, such as fruits and vegetables. Cook seafood, meat, poultry, and eggs to the recommended temperature. Get a food thermometer. Store foods at safe temperatures. In general: Keep cold foods at 40F (4.4C) or below. Keep hot foods at 140F (60C) or above. Keep your freezer at 0F (-17.8C) or below. Foods are not safe to eat if they have been between the temperatures of 40-140F (4.4-60C) for more   than 2 hours. What foods should I eat? Fruits Aim to eat 1-2 cups of fresh, canned (in natural juice),  or frozen fruits each day. One cup of fruit equals 1 small apple, 1 large banana, 8 large strawberries, 1 cup (237 g) canned fruit,  cup (82 g) dried fruit, or 1 cup (240 mL) 100% juice. Vegetables Aim to eat 2-4 cups of fresh and frozen vegetables each day, including different varieties and colors. One cup of vegetables equals 1 cup (91 g) broccoli or cauliflower florets, 2 medium carrots, 2 cups (150 g) raw, leafy greens, 1 large tomato, 1 large bell pepper, 1 large sweet potato, or 1 medium white potato. Grains Aim to eat 5-10 ounce-equivalents of whole grains each day. Examples of 1 ounce-equivalent of grains include 1 slice of bread, 1 cup (40 g) ready-to-eat cereal, 3 cups (24 g) popcorn, or  cup (93 g) cooked rice. Meats and other proteins Try to eat 5-7 ounce-equivalents of protein each day. Examples of 1 ounce-equivalent of protein include 1 egg,  oz nuts (12 almonds, 24 pistachios, or 7 walnut halves), 1/4 cup (90 g) cooked beans, 6 tablespoons (90 g) hummus or 1 tablespoon (16 g) peanut butter. A cut of meat or fish that is the size of a deck of cards is about 3-4 ounce-equivalents (85 g). Of the protein you eat each week, try to have at least 8 sounce (227 g) of seafood. This is about 2 servings per week. This includes salmon, trout, herring, sardines, and anchovies. Dairy Aim to eat 3 cup-equivalents of fat-free or low-fat dairy each day. Examples of 1 cup-equivalent of dairy include 1 cup (240 mL) milk, 8 ounces (250 g) yogurt, 1 ounces (44 g) natural cheese, or 1 cup (240 mL) fortified soy milk. Fats and oils Aim for about 5 teaspoons (21 g) of fats and oils per day. Choose monounsaturated fats, such as canola and olive oils, mayonnaise made with olive oil or avocado oil, avocados, peanut butter, and most nuts, or polyunsaturated fats, such as sunflower, corn, and soybean oils, walnuts, pine nuts, sesame seeds, sunflower seeds, and flaxseed. Beverages Aim for 6 eight-ounce glasses of  water per day. Limit coffee to 3-5 eight-ounce cups per day. Limit caffeinated beverages that have added calories, such as soda and energy drinks. If you drink alcohol: Limit how much you have to: 0-1 drink a day if you are female. 0-2 drinks a day if you are female. Know how much alcohol is in your drink. In the [place] one drink is one 12 oz bottle of beer (355 mL), one 5 oz glass of wine (148 mL), or one 1 oz glass of hard liquor (44 mL). Seasoning and other foods Try not to add too much salt to your food. Try using herbs and spices instead of salt. Try not to add sugar to food. This information is based on [place] nutrition guidelines. To learn more, visit choosemyplate.gov. Exact amounts may vary. You may need different amounts. This information is not intended to replace advice given to you by your health care provider. Make sure you discuss any questions you have with your health care provider. Document Revised: 06/12/2022 Document Reviewed: 06/12/2022 Elsevier Patient Education  2023 [employer]  

## 2023-02-05 ENCOUNTER — Encounter: Payer: Self-pay | Admitting: Nurse Practitioner

## 2023-02-05 ENCOUNTER — Ambulatory Visit (INDEPENDENT_AMBULATORY_CARE_PROVIDER_SITE_OTHER): Payer: Medicare HMO | Admitting: Nurse Practitioner

## 2023-02-05 DIAGNOSIS — Z0000 Encounter for general adult medical examination without abnormal findings: Secondary | ICD-10-CM

## 2023-02-05 DIAGNOSIS — I10 Essential (primary) hypertension: Secondary | ICD-10-CM

## 2023-02-05 DIAGNOSIS — M791 Myalgia, unspecified site: Secondary | ICD-10-CM

## 2023-02-05 DIAGNOSIS — E559 Vitamin D deficiency, unspecified: Secondary | ICD-10-CM

## 2023-02-05 DIAGNOSIS — G894 Chronic pain syndrome: Secondary | ICD-10-CM

## 2023-02-05 DIAGNOSIS — Z1231 Encounter for screening mammogram for malignant neoplasm of breast: Secondary | ICD-10-CM | POA: Diagnosis not present

## 2023-02-05 DIAGNOSIS — T466X5A Adverse effect of antihyperlipidemic and antiarteriosclerotic drugs, initial encounter: Secondary | ICD-10-CM

## 2023-02-05 DIAGNOSIS — R7303 Prediabetes: Secondary | ICD-10-CM | POA: Diagnosis not present

## 2023-02-05 DIAGNOSIS — M48061 Spinal stenosis, lumbar region without neurogenic claudication: Secondary | ICD-10-CM

## 2023-02-05 DIAGNOSIS — E782 Mixed hyperlipidemia: Secondary | ICD-10-CM | POA: Diagnosis not present

## 2023-02-05 LAB — BAYER DCA HB A1C WAIVED: HB A1C (BAYER DCA - WAIVED): 6.3 % — ABNORMAL HIGH (ref 4.8–5.6)

## 2023-02-05 LAB — MICROALBUMIN, URINE WAIVED
Creatinine, Urine Waived: 200 mg/dL (ref 10–300)
Microalb, Ur Waived: 30 mg/L — ABNORMAL HIGH (ref 0–19)
Microalb/Creat Ratio: <30 mg/g (ref <30)

## 2023-02-05 MED ORDER — ROSUVASTATIN CALCIUM 5 MG PO TABS: ORAL_TABLET | ORAL | 4 refills | Status: DC

## 2023-02-05 MED ORDER — EZETIMIBE 10 MG PO TABS: 10.0000 mg | ORAL_TABLET | Freq: Every day | ORAL | 4 refills | Status: DC

## 2023-02-05 MED ORDER — HYDROCHLOROTHIAZIDE 12.5 MG PO TABS: 12.5000 mg | ORAL_TABLET | Freq: Every day | ORAL | 4 refills | Status: DC

## 2023-02-05 NOTE — Progress Notes (Signed)
BP 117/79   Pulse 63   Temp (!) 97 F (36.1 C) (Oral)   Ht 5' 4.02" (1.626 m)   Wt 202 lb 3.2 oz (91.7 kg)   LMP  (LMP Unknown)   SpO2 93%   BMI 34.69 kg/m    Subjective:    Patient ID: Kiara Parker, female    DOB: 28-Jun-1951, 72 y.o.   MRN: [id number]  HPI: Kiara Parker is a 72 y.o. female presenting on 02/05/2023 for comprehensive medical examination. Current medical complaints include:none  She currently lives with: husband, 63 years of marriage in August 2024 Menopausal Symptoms: no   She has ongoing back pain -- followed with neurosurgery in past, last visit May 2022 when epidural was provided which worked well.  Dr. Murray Hodgkins, she reports he is retired now.  HYPERLIPIDEMIA Continues on Crestor 5 MG three days a week + Zetia daily.  She states the Crestor on three day schedule works better and less side effects.  Continues on HCTZ 12.5 MG daily. Is walking more at present. Hyperlipidemia status: good compliance Satisfied with current treatment?  no Side effects:  no Medication compliance: good compliance Past cholesterol meds: Lipitor and Pravastatin Supplements: none Aspirin:  yes The 10-year ASCVD risk score (Arnett DK, et al., 2019) is: 20.5%   Values used to calculate the score:     Age: 63 years     Sex: Female     Is Non-Hispanic African American: No     Diabetic: Yes     Tobacco smoker: No     Systolic Blood Pressure: 117 mmHg     Is BP treated: Yes     HDL Cholesterol: 46 mg/dL     Total Cholesterol: 211 mg/dL Chest pain:  no Coronary artery disease:  no Family history CAD:  no Family history early CAD:  no    PREDIABETES: A1c November 5.9%.  Diet focused at this time, although she reports not always following diet well. History of low Vitamin D on labs.  She was treated with high dose Vit D, but now taking 1000 units daily.  No recent falls or fractures. Polydipsia/polyuria: no Visual disturbance: no Chest pain: no Paresthesias:  no  Depression Screen done today and results listed below:     02/05/2023    1:21 PM 08/21/2022   10:56 AM 08/21/2022   10:49 AM 01/25/2022    9:12 AM 08/09/2021   12:55 PM  Depression screen PHQ 2/9  Decreased Interest 0 0 0 0 0  Down, Depressed, Hopeless 0 0 0 0 0  PHQ - 2 Score 0 0 0 0 0  Altered sleeping 2 1 1 1    Tired, decreased energy 2 0 0 1   Change in appetite 3 0 0 1   Feeling bad or failure about yourself  0 0 0 0   Trouble concentrating 0 0 0 0   Moving slowly or fidgety/restless 0 0 0 0   Suicidal thoughts 0 0 0 0   PHQ-9 Score 7 1 1 3    Difficult doing work/chores Not difficult at all Not difficult at all Not difficult at all Not difficult at all       02/05/2023    1:21 PM 08/22/2022   10:46 AM 01/25/2022    9:12 AM 12/28/2020    8:15 AM  GAD 7 : Generalized Anxiety Score  Nervous, Anxious, on Edge 0 0 0 0  Control/stop worrying 0 0 0 0  Worry too much -  different things 0 0 0 0  Trouble relaxing 0 0 0 0  Restless 0 0 0 0  Easily annoyed or irritable 0 0 0 0  Afraid - awful might happen 0 0 0 0  Total GAD 7 Score 0 0 0 0  Anxiety Difficulty Not difficult at all Not difficult at all Not difficult at all       08/09/2021   12:52 PM 08/09/2021   12:58 PM 01/25/2022    9:12 AM 08/21/2022   10:43 AM 02/05/2023    1:22 PM  Fall Risk  Falls in the past year? 0 0 0 0 0  Was there an injury with Fall? 0 0 0 0 0  Fall Risk Category Calculator 0 0 0 0 0  Fall Risk Category (Retired) Low Low Low Low   (RETIRED) Patient Fall Risk Level Low fall risk Low fall risk Low fall risk Low fall risk   Patient at Risk for Falls Due to   No Fall Risks    Fall risk Follow up Falls evaluation completed;Education provided;Falls prevention discussed Falls evaluation completed;Education provided;Falls prevention discussed Education provided Falls evaluation completed;Education provided;Falls prevention discussed Follow up appointment    Functional Status Survey: Is the patient deaf  or have difficulty hearing?: No Does the patient have difficulty seeing, even when wearing glasses/contacts?: No Does the patient have difficulty concentrating, remembering, or making decisions?: No Does the patient have difficulty walking or climbing stairs?: No Does the patient have difficulty dressing or bathing?: No Does the patient have difficulty doing errands alone such as visiting a doctor's office or shopping?: No   Past Medical History:  Past Medical History:  Diagnosis Date   Chronic pain syndrome    Hyperlipidemia    Hypertension    Lumbago    Spinal stenosis     Surgical History:  Past Surgical History:  Procedure Laterality Date   COLONOSCOPY WITH PROPOFOL N/A 11/27/2018   Procedure: COLONOSCOPY WITH PROPOFOL;  Surgeon: Wyline Mood, MD;  Location: [facility] ENDOSCOPY;  Service: Gastroenterology;  Laterality: N/A;   TUBAL LIGATION      Medications:  Current Outpatient Medications on File Prior to Visit  Medication Sig   baclofen (LIORESAL) 10 MG tablet Take 0.5 tablets (5 mg total) by mouth at bedtime.   Cholecalciferol 25 MCG (1000 UT) capsule Take 2,000 Units by mouth daily.   gabapentin (NEURONTIN) 300 MG capsule TAKE 1 CAPSULE BY MOUTH THREE TIMES A DAY   meloxicam (MOBIC) 15 MG tablet Take 1 tablet (15 mg total) by mouth daily.   ASPIRIN 81 PO Take 1 tablet by mouth daily. (Patient not taking: Reported on 02/05/2023)   No current facility-administered medications on file prior to visit.    Allergies:  Allergies  Allergen Reactions   Atorvastatin Other (See Comments)   Clavulanic Acid    Erythromycin    Penicillins Hives   Pravastatin Other (See Comments)   Zithromax [Azithromycin] Hives    Social History:  Social History   Socioeconomic History   Marital status: Married    Spouse name: Not on file   Number of children: Not on file   Years of education: Not on file   Highest education level: Not on file  Occupational History   Occupation: retired   Tobacco Use   Smoking status: Never   Smokeless tobacco: Never  Vaping Use   Vaping Use: Never used  Substance and Sexual Activity   Alcohol use: No   Drug use: No  Sexual activity: Yes    Birth control/protection: None  Other Topics Concern   Not on file  Social History Narrative   Not on file   Social Determinants of Health   Financial Resource Strain: Low Risk  (08/21/2022)   Overall Financial Resource Strain (CARDIA)    Difficulty of Paying Living Expenses: Not hard at all  Food Insecurity: No Food Insecurity (08/21/2022)   Hunger Vital Sign    Worried About Running Out of Food in the Last Year: Never true    Ran Out of Food in the Last Year: Never true  Transportation Needs: No Transportation Needs (08/21/2022)   PRAPARE - Administrator, Civil Service (Medical): No    Lack of Transportation (Non-Medical): No  Physical Activity: Inactive (08/21/2022)   Exercise Vital Sign    Days of Exercise per Week: 0 days    Minutes of Exercise per Session: 0 min  Stress: No Stress Concern Present (08/21/2022)   [employer] of Occupational Health - Occupational Stress Questionnaire    Feeling of Stress : Not at all  Social Connections: Socially Integrated (08/21/2022)   Social Connection and Isolation Panel [NHANES]    Frequency of Communication with Friends and Family: More than three times a week    Frequency of Social Gatherings with Friends and Family: Three times a week    Attends Religious Services: More than 4 times per year    Active Member of Clubs or Organizations: No    Attends Banker Meetings: More than 4 times per year    Marital Status: Married  Catering manager Violence: Not At Risk (08/21/2022)   Humiliation, Afraid, Rape, and Kick questionnaire    Fear of Current or Ex-Partner: No    Emotionally Abused: No    Physically Abused: No    Sexually Abused: No   Social History   Tobacco Use  Smoking Status Never  Smokeless  Tobacco Never   Social History   Substance and Sexual Activity  Alcohol Use No    Family History:  Family History  Problem Relation Age of Onset   Cancer Mother        breast   Arthritis Mother    Osteoporosis Mother    Breast cancer Mother 65   Heart disease Father    Lung disease Father    Cancer Sister        lymphoma   Multiple sclerosis Sister    Cancer Maternal Grandmother        lymphoma    Past medical history, surgical history, medications, allergies, family history and social history reviewed with patient today and changes made to appropriate areas of the chart.   Review of Systems  Constitutional:  Negative for fever, malaise/fatigue and weight loss.  Respiratory:  Negative for cough, sputum production, shortness of breath and wheezing.   Cardiovascular:  Negative for chest pain, orthopnea and leg swelling.  Neurological: Negative.   Psychiatric/Behavioral:  Negative for depression, memory loss and suicidal ideas. The patient has insomnia. The patient is not nervous/anxious.    All other ROS negative except what is listed above and in the HPI.      Objective:    BP 117/79   Pulse 63   Temp (!) 97 F (36.1 C) (Oral)   Ht 5' 4.02" (1.626 m)   Wt 202 lb 3.2 oz (91.7 kg)   LMP  (LMP Unknown)   SpO2 93%   BMI 34.69 kg/m   Wt Readings  from Last 3 Encounters:  02/05/23 202 lb 3.2 oz (91.7 kg)  08/22/22 217 lb (98.4 kg)  01/25/22 222 lb 6.4 oz (100.9 kg)    Physical Exam Vitals and nursing note reviewed.  Constitutional:      General: She is awake. She is not in acute distress.    Appearance: She is well-developed and well-groomed. She is obese. She is not ill-appearing.  HENT:     Head: Normocephalic and atraumatic.     Right Ear: Hearing, tympanic membrane, ear canal and external ear normal. No drainage.     Left Ear: Hearing, tympanic membrane, ear canal and external ear normal. No drainage.     Nose: Nose normal.     Right Sinus: No maxillary  sinus tenderness or frontal sinus tenderness.     Left Sinus: No maxillary sinus tenderness or frontal sinus tenderness.     Mouth/Throat:     Mouth: Mucous membranes are moist.     Pharynx: Oropharynx is clear. Uvula midline. No pharyngeal swelling, oropharyngeal exudate or posterior oropharyngeal erythema.  Eyes:     General: Lids are normal.        Right eye: No discharge.        Left eye: No discharge.     Extraocular Movements: Extraocular movements intact.     Conjunctiva/sclera: Conjunctivae normal.     Pupils: Pupils are equal, round, and reactive to light.     Visual Fields: Right eye visual fields normal and left eye visual fields normal.  Neck:     Thyroid: No thyromegaly.     Vascular: No carotid bruit.     Trachea: Trachea normal.  Cardiovascular:     Rate and Rhythm: Normal rate and regular rhythm.     Heart sounds: Normal heart sounds. No murmur heard.    No gallop.  Pulmonary:     Effort: Pulmonary effort is normal. No accessory muscle usage or respiratory distress.     Breath sounds: Normal breath sounds.  Chest:     Comments: Deferred per patient request. Abdominal:     General: Bowel sounds are normal.     Palpations: Abdomen is soft. There is no hepatomegaly or splenomegaly.     Tenderness: There is no abdominal tenderness.  Musculoskeletal:        General: Normal range of motion.     Cervical back: Normal range of motion and neck supple.     Right lower leg: No edema.     Left lower leg: No edema.  Lymphadenopathy:     Head:     Right side of head: No submental, submandibular, tonsillar, preauricular or posterior auricular adenopathy.     Left side of head: No submental, submandibular, tonsillar, preauricular or posterior auricular adenopathy.     Cervical: No cervical adenopathy.  Skin:    General: Skin is warm and dry.     Capillary Refill: Capillary refill takes less than 2 seconds.     Findings: No rash.  Neurological:     Mental Status: She is  alert and oriented to person, place, and time.     Gait: Gait is intact.     Deep Tendon Reflexes: Reflexes are normal and symmetric.     Reflex Scores:      Brachioradialis reflexes are 2+ on the right side and 2+ on the left side.      Patellar reflexes are 2+ on the right side and 2+ on the left side. Psychiatric:  Attention and Perception: Attention normal.        Mood and Affect: Mood normal.        Speech: Speech normal.        Behavior: Behavior normal. Behavior is cooperative.        Thought Content: Thought content normal.        Judgment: Judgment normal.    Results for orders placed or performed in visit on 08/22/22  WET PREP FOR TRICH, YEAST, CLUE   Specimen: Genital   Genital  Result Value Ref Range   Trichomonas Exam Negative Negative   Yeast Exam Negative Negative   Clue Cell Exam Negative Negative  Microscopic Examination   Genital  Result Value Ref Range   WBC, UA 0-5 0 - 5 /hpf   RBC, Urine 0-2 0 - 2 /hpf   Epithelial Cells (non renal) 0-10 0 - 10 /hpf   Bacteria, UA None seen None seen/Few  Bayer DCA Hb A1c Waived  Result Value Ref Range   HB A1C (BAYER DCA - WAIVED) 5.9 (H) 4.8 - 5.6 %  Comprehensive metabolic panel  Result Value Ref Range   Glucose 108 (H) 70 - 99 mg/dL   BUN 11 8 - 27 mg/dL   Creatinine, Ser 1.61 0.57 - 1.00 mg/dL   eGFR 68 >09 UE/AVW/0.98   BUN/Creatinine Ratio 12 12 - 28   Sodium 140 134 - 144 mmol/L   Potassium 4.2 3.5 - 5.2 mmol/L   Chloride 99 96 - 106 mmol/L   CO2 25 20 - 29 mmol/L   Calcium 9.7 8.7 - 10.3 mg/dL   Total Protein 7.2 6.0 - 8.5 g/dL   Albumin 4.4 3.9 - 4.9 g/dL   Globulin, Total 2.8 1.5 - 4.5 g/dL   Albumin/Globulin Ratio 1.6 1.2 - 2.2   Bilirubin Total 0.5 0.0 - 1.2 mg/dL   Alkaline Phosphatase 81 44 - 121 IU/L   AST 22 0 - 40 IU/L   ALT 20 0 - 32 IU/L  Lipid Panel w/o Chol/HDL Ratio  Result Value Ref Range   Cholesterol, Total 211 (H) 100 - 199 mg/dL   Triglycerides 119 (H) 0 - 149 mg/dL   HDL  46 >14 mg/dL   VLDL Cholesterol Cal 28 5 - 40 mg/dL   LDL Chol Calc (NIH) 782 (H) 0 - 99 mg/dL  VITAMIN D 25 Hydroxy (Vit-D Deficiency, Fractures)  Result Value Ref Range   Vit D, 25-Hydroxy 29.0 (L) 30.0 - 100.0 ng/mL  Urinalysis, Routine w reflex microscopic  Result Value Ref Range   Specific Gravity, UA 1.020 1.005 - 1.030   pH, UA 5.0 5.0 - 7.5   Color, UA Yellow Yellow   Appearance Ur Clear Clear   Leukocytes,UA Trace (A) Negative   Protein,UA Negative Negative/Trace   Glucose, UA Negative Negative   Ketones, UA Negative Negative   RBC, UA Negative Negative   Bilirubin, UA Negative Negative   Urobilinogen, Ur 0.2 0.2 - 1.0 mg/dL   Nitrite, UA Negative Negative   Microscopic Examination See below:       Assessment & Plan:   Problem List Items Addressed This Visit       Cardiovascular and Mediastinum   Hypertension (Chronic)    Chronic, stable,  BP at goal on recheck today. Continue low dose HCTZ and adjust as needed. Recommend she monitor BP at least a few mornings a week at home and document.  DASH diet at home.  Labs today: CBC, CMP, TSH, urine ALB.  Urine ALB 30 in May 2023, may benefit from ACE or ARB in future.  Return in 6 months.       Relevant Medications   ezetimibe (ZETIA) 10 MG tablet   rosuvastatin (CRESTOR) 5 MG tablet   hydrochlorothiazide (HYDRODIURIL) 12.5 MG tablet   Other Relevant Orders   CBC with Differential/Platelet   TSH     Other   Chronic pain syndrome (Chronic)    To lower back, her previous provider retired.  Will place referral to Dr. Yves Dill for physiatry.      Relevant Orders   Ambulatory referral to Pain Clinic   Hyperlipidemia (Chronic)    Chronic, ongoing.  Is tolerating Crestor on 3 day week schedule + Zetia daily.  Will continue this and adjust as needed.  Obtain lipid panel today.      Relevant Medications   ezetimibe (ZETIA) 10 MG tablet   rosuvastatin (CRESTOR) 5 MG tablet   hydrochlorothiazide (HYDRODIURIL) 12.5 MG  tablet   Other Relevant Orders   Comprehensive metabolic panel   Lipid Panel w/o Chol/HDL Ratio   Morbid obesity (HCC) - Primary (Chronic)    BMI 34.69 with HTN and Prediabetes - has lost 20 pounds since last year, praised for this.  Recommended eating smaller high protein, low fat meals more frequently and exercising 30 mins a day 5 times a week with a goal of 10-15lb weight loss in the next 3 months. Patient voiced their understanding and motivation to adhere to these recommendations.       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   If symptoms present then consider weekly dosing or change to Repatha.  Lipid panel today.      Relevant Orders   Comprehensive metabolic panel   Lipid Panel w/o Chol/HDL Ratio   Prediabetes (Chronic)    Recheck A1c today, she continues to be between 5.6 to 6.3% on review.  Will adjust plan of care and add medication as needed.  Continue diet focus at this time.  Urine ALB 21 Feb 2022, consider ACE or ARB in future.      Relevant Orders   Bayer DCA Hb A1c Waived   Microalbumin, Urine Waived   Vitamin D deficiency (Chronic)    Chronic, ongoing.  Continue daily supplement and recheck Vit D level today, adjust dosing as needed.  DEXA with normal findings.      Relevant Orders   VITAMIN D 25 Hydroxy (Vit-D Deficiency, Fractures)   Spinal stenosis    Refer to chronic pain syndrome plan of care.      Relevant Orders   Ambulatory referral to Pain Clinic   Other Visit Diagnoses     Encounter for screening mammogram for malignant neoplasm of breast       Mammogram ordered and instructed on how to schedule.   Relevant Orders   MM 3D SCREENING MAMMOGRAM BILATERAL BREAST   Encounter for annual physical exam       Annual physical today with labs and health maintenance reviewed, discussed with patient.        Follow up plan: Return in about 6 months (around 08/08/2023) for  HLD, HTN, PREDIABETES.   LABORATORY TESTING:  - Pap smear: not applicable  IMMUNIZATIONS:   - Tdap: Tetanus vaccination status reviewed: last tetanus booster within 10 years - Influenza: Up to date - Pneumovax: Up to date - Prevnar: Up to date -  HPV: Not applicable - Zostavax vaccine: Refuses, to obtain at drug store if changes her mind - Covid: Has had 6 total  SCREENING: -Mammogram: Up to date -- next 04/20/23 - Colonoscopy: Up to date  - Bone Density: Up to date  -Hearing Test: Not applicable  -Spirometry: Not applicable   PATIENT COUNSELING:   Advised to take 1 mg of folate supplement per day if capable of pregnancy.   Sexuality: Discussed sexually transmitted diseases, partner selection, use of condoms, avoidance of unintended pregnancy  and contraceptive alternatives.   Advised to avoid cigarette smoking.  I discussed with the patient that most people either abstain from alcohol or drink within safe limits (<=14/week and <=4 drinks/occasion for males, <=7/weeks and <= 3 drinks/occasion for females) and that the risk for alcohol disorders and other health effects rises proportionally with the number of drinks per week and how often a drinker exceeds daily limits.  Discussed cessation/primary prevention of drug use and availability of treatment for abuse.   Diet: Encouraged to adjust caloric intake to maintain  or achieve ideal body weight, to reduce intake of dietary saturated fat and total fat, to limit sodium intake by avoiding high sodium foods and not adding table salt, and to maintain adequate dietary potassium and calcium preferably from fresh fruits, vegetables, and low-fat dairy products.    Stressed the importance of regular exercise  Injury prevention: Discussed safety belts, safety helmets, smoke detector, smoking near bedding or upholstery.   Dental health: Discussed importance of regular tooth brushing, flossing, and dental visits.    NEXT PREVENTATIVE  PHYSICAL DUE IN 1 YEAR. Return in about 6 months (around 08/08/2023) for HLD, HTN, PREDIABETES.

## 2023-02-05 NOTE — Assessment & Plan Note (Signed)
To lower back, her previous provider retired.  Will place referral to Dr. Yves Dill for physiatry.

## 2023-02-05 NOTE — Assessment & Plan Note (Signed)
Recheck A1c today, she continues to be between 5.6 to 6.3% on review.  Will adjust plan of care and add medication as needed.  Continue diet focus at this time.  Urine ALB 21 Feb 2022, consider ACE or ARB in future.

## 2023-02-05 NOTE — Assessment & Plan Note (Signed)
At this time is tolerating Crestor on 3 day a week schedule, adjust this as needed.   If symptoms present then consider weekly dosing or change to Repatha.  Lipid panel today. ?

## 2023-02-05 NOTE — Assessment & Plan Note (Signed)
BMI 34.69 with HTN and Prediabetes - has lost 20 pounds since last year, praised for this.  Recommended eating smaller high protein, low fat meals more frequently and exercising 30 mins a day 5 times a week with a goal of 10-15lb weight loss in the next 3 months. Patient voiced their understanding and motivation to adhere to these recommendations.

## 2023-02-05 NOTE — Assessment & Plan Note (Signed)
Refer to chronic pain syndrome plan of care. 

## 2023-02-05 NOTE — Assessment & Plan Note (Signed)
Chronic, ongoing.  Continue daily supplement and recheck Vit D level today, adjust dosing as needed.  DEXA with normal findings. 

## 2023-02-05 NOTE — Assessment & Plan Note (Addendum)
Chronic, stable,  BP at goal on recheck today. Continue low dose HCTZ and adjust as needed. Recommend she monitor BP at least a few mornings a week at home and document.  DASH diet at home.  Labs today: CBC, CMP, TSH, urine ALB.  Urine ALB 30 in May 2023, may benefit from ACE or ARB in future.  Return in 6 months.

## 2023-02-05 NOTE — Assessment & Plan Note (Signed)
Chronic, ongoing.  Is tolerating Crestor on 3 day week schedule + Zetia daily.  Will continue this and adjust as needed.  Obtain lipid panel today.

## 2023-02-06 LAB — LIPID PANEL W/O CHOL/HDL RATIO
Cholesterol, Total: 150 mg/dL (ref 100–199)
HDL: 48 mg/dL (ref >39)
LDL Chol Calc (NIH): 84 mg/dL (ref 0–99)
Triglycerides: 95 mg/dL (ref 0–149)
VLDL Cholesterol Cal: 18 mg/dL (ref 5–40)

## 2023-02-06 LAB — COMPREHENSIVE METABOLIC PANEL
ALT: 21 IU/L (ref 0–32)
AST: 18 IU/L (ref 0–40)
Albumin/Globulin Ratio: 1.4 (ref 1.2–2.2)
Albumin: 4.2 g/dL (ref 3.9–4.9)
Alkaline Phosphatase: 81 IU/L (ref 44–121)
BUN/Creatinine Ratio: 16 (ref 12–28)
BUN: 13 mg/dL (ref 8–27)
Bilirubin Total: 0.4 mg/dL (ref 0.0–1.2)
CO2: 26 mmol/L (ref 20–29)
Calcium: 9.5 mg/dL (ref 8.7–10.3)
Chloride: 99 mmol/L (ref 96–106)
Creatinine, Ser: 0.82 mg/dL (ref 0.57–1.00)
Globulin, Total: 3.1 g/dL (ref 1.5–4.5)
Glucose: 88 mg/dL (ref 70–99)
Potassium: 4.1 mmol/L (ref 3.5–5.2)
Sodium: 140 mmol/L (ref 134–144)
Total Protein: 7.3 g/dL (ref 6.0–8.5)
eGFR: 77 mL/min/{1.73_m2} (ref >59)

## 2023-02-06 LAB — CBC WITH DIFFERENTIAL/PLATELET
Basophils Absolute: 0.1 10*3/uL (ref 0.0–0.2)
Basos: 1 %
EOS (ABSOLUTE): 0.4 10*3/uL (ref 0.0–0.4)
Eos: 6 %
Hematocrit: 43.5 % (ref 34.0–46.6)
Hemoglobin: 14 g/dL (ref 11.1–15.9)
Immature Grans (Abs): 0 10*3/uL (ref 0.0–0.1)
Immature Granulocytes: 0 %
Lymphocytes Absolute: 2 10*3/uL (ref 0.7–3.1)
Lymphs: 33 %
MCH: 28.9 pg (ref 26.6–33.0)
MCHC: 32.2 g/dL (ref 31.5–35.7)
MCV: 90 fL (ref 79–97)
Monocytes Absolute: 0.4 10*3/uL (ref 0.1–0.9)
Monocytes: 7 %
Neutrophils Absolute: 3.2 10*3/uL (ref 1.4–7.0)
Neutrophils: 53 %
Platelets: 319 10*3/uL (ref 150–450)
RBC: 4.85 x10E6/uL (ref 3.77–5.28)
RDW: 13.1 % (ref 11.7–15.4)
WBC: 6.1 10*3/uL (ref 3.4–10.8)

## 2023-02-06 LAB — VITAMIN D 25 HYDROXY (VIT D DEFICIENCY, FRACTURES): Vit D, 25-Hydroxy: 40.1 ng/mL (ref 30.0–100.0)

## 2023-02-06 LAB — TSH: TSH: 0.743 u[IU]/mL (ref 0.450–4.500)

## 2023-02-06 NOTE — Progress Notes (Signed)
Contacted via MyChart   Good afternoon Deidre, your labs have returned and overall look great.  Cholesterol levels improved with addition of Zetia.  Continue all current medications as ordered.  Any questions? Keep being amazing!!  Thank you for allowing me to participate in your care.  I appreciate you. Kindest regards, Yittel Emrich

## 2023-02-20 DIAGNOSIS — M5441 Lumbago with sciatica, right side: Secondary | ICD-10-CM | POA: Diagnosis not present

## 2023-02-20 DIAGNOSIS — M5442 Lumbago with sciatica, left side: Secondary | ICD-10-CM | POA: Diagnosis not present

## 2023-02-20 DIAGNOSIS — G8929 Other chronic pain: Secondary | ICD-10-CM | POA: Diagnosis not present

## 2023-02-21 ENCOUNTER — Other Ambulatory Visit: Payer: Self-pay | Admitting: Physical Medicine & Rehabilitation

## 2023-02-21 DIAGNOSIS — M5441 Lumbago with sciatica, right side: Secondary | ICD-10-CM

## 2023-02-21 DIAGNOSIS — M5442 Lumbago with sciatica, left side: Secondary | ICD-10-CM

## 2023-02-27 ENCOUNTER — Other Ambulatory Visit: Payer: Self-pay | Admitting: Nurse Practitioner

## 2023-02-27 MED ORDER — BACLOFEN 10 MG PO TABS: 5.0000 mg | ORAL_TABLET | Freq: Every day | ORAL | 1 refills | Status: DC

## 2023-02-27 MED ORDER — MELOXICAM 15 MG PO TABS: 15.0000 mg | ORAL_TABLET | Freq: Every day | ORAL | 3 refills | Status: DC

## 2023-02-27 MED ORDER — GABAPENTIN 300 MG PO CAPS: ORAL_CAPSULE | ORAL | 3 refills | Status: DC

## 2023-02-27 NOTE — Telephone Encounter (Signed)
Requested Prescriptions  Pending Prescriptions Disp Refills   baclofen (LIORESAL) 10 MG tablet 45 tablet 1    Sig: Take 0.5 tablets (5 mg total) by mouth at bedtime.     Analgesics:  Muscle Relaxants - baclofen Passed - 02/27/2023 12:56 PM      Passed - Cr in normal range and within 180 days    Creatinine, Ser  Date Value Ref Range Status  02/05/2023 0.82 0.57 - 1.00 mg/dL Final         Passed - eGFR is 30 or above and within 180 days    GFR calc Af Amer  Date Value Ref Range Status  10/29/2020 71 >59 mL/min/1.73 Final    Comment:    **In accordance with recommendations from the NKF-ASN Task force,**   [facility] is in the process of updating its eGFR calculation to the   2021 CKD-EPI creatinine equation that estimates kidney function   without a race variable.    GFR calc non Af Amer  Date Value Ref Range Status  10/29/2020 62 >59 mL/min/1.73 Final   eGFR  Date Value Ref Range Status  02/05/2023 77 >59 mL/min/1.73 Final         Passed - Valid encounter within last 6 months    Recent Outpatient Visits           3 weeks ago Morbid obesity (HCC)   [place] [facility] [place], Corrie Dandy T, NP   6 months ago Morbid obesity (HCC)   [place] [facility] [place], Corrie Dandy T, NP   1 year ago Morbid obesity (HCC)   [place] [facility] [place], Corrie Dandy T, NP   1 year ago Morbid obesity (HCC)   [place] [facility] [place], Corrie Dandy T, NP   1 year ago Acute post-traumatic headache, not intractable   [place] [facility] [place], Dorie Rank, NP       Future Appointments             In 5 months Cannady, Franklin T, NP [place] [facility]             gabapentin (NEURONTIN) 300 MG capsule 270 capsule 3    Sig: TAKE 1 CAPSULE BY MOUTH THREE TIMES A DAY     Neurology: Anticonvulsants - gabapentin Passed - 02/27/2023 12:56 PM      Passed - Cr in normal range and within 360 days     Creatinine, Ser  Date Value Ref Range Status  02/05/2023 0.82 0.57 - 1.00 mg/dL Final         Passed - Completed PHQ-2 or PHQ-9 in the last 360 days      Passed - Valid encounter within last 12 months    Recent Outpatient Visits           3 weeks ago Morbid obesity (HCC)   [place] [facility] [place], [place] T, NP   6 months ago Morbid obesity (HCC)   [place] [facility] [place], Corrie Dandy T, NP   1 year ago Morbid obesity (HCC)   [place] [facility] [place], Corrie Dandy T, NP   1 year ago Morbid obesity (HCC)   [place] [facility] [place], Corrie Dandy T, NP   1 year ago Acute post-traumatic headache, not intractable   [place] [facility] [place], Dorie Rank, NP       Future Appointments  In 5 months Cannady, Dorie Rank, NP [place] [facility]             meloxicam (MOBIC) 15 MG tablet 90 tablet 3    Sig: Take 1 tablet (15 mg total) by mouth daily.     Analgesics:  COX2 Inhibitors Failed - 02/27/2023 12:56 PM      Failed - Manual Review: Labs are only required if the patient has taken medication for more than 8 weeks.      Passed - HGB in normal range and within 360 days    Hemoglobin  Date Value Ref Range Status  02/05/2023 14.0 11.1 - 15.9 g/dL Final         Passed - Cr in normal range and within 360 days    Creatinine, Ser  Date Value Ref Range Status  02/05/2023 0.82 0.57 - 1.00 mg/dL Final         Passed - HCT in normal range and within 360 days    Hematocrit  Date Value Ref Range Status  02/05/2023 43.5 34.0 - 46.6 % Final         Passed - AST in normal range and within 360 days    AST  Date Value Ref Range Status  02/05/2023 18 0 - 40 IU/L Final         Passed - ALT in normal range and within 360 days    ALT  Date Value Ref Range Status  02/05/2023 21 0 - 32 IU/L Final         Passed - eGFR is 30 or above and within 360 days    GFR calc Af  Amer  Date Value Ref Range Status  10/29/2020 71 >59 mL/min/1.73 Final    Comment:    **In accordance with recommendations from the NKF-ASN Task force,**   [facility] is in the process of updating its eGFR calculation to the   2021 CKD-EPI creatinine equation that estimates kidney function   without a race variable.    GFR calc non Af Amer  Date Value Ref Range Status  10/29/2020 62 >59 mL/min/1.73 Final   eGFR  Date Value Ref Range Status  02/05/2023 77 >59 mL/min/1.73 Final         Passed - Patient is not pregnant      Passed - Valid encounter within last 12 months    Recent Outpatient Visits           3 weeks ago Morbid obesity (HCC)   [place] [facility] [place], Corrie Dandy T, NP   6 months ago Morbid obesity (HCC)   [place] [facility] [place], Corrie Dandy T, NP   1 year ago Morbid obesity (HCC)   [place] [facility] [place], Corrie Dandy T, NP   1 year ago Morbid obesity (HCC)   [place] [facility] [place], Corrie Dandy T, NP   1 year ago Acute post-traumatic headache, not intractable   [place] [facility] [place], Dorie Rank, NP       Future Appointments             In 5 months Cannady, Dorie Rank, NP [place] [facility]

## 2023-02-27 NOTE — Telephone Encounter (Signed)
Medication Refill - Medication: meloxicam (MOBIC) 15 MG tablet,  gabapentin (NEURONTIN) 300 MG capsule, and baclofen (LIORESAL) 10 MG tablet   Has the patient contacted their pharmacy? Yes.   Pt told to contact provider  Preferred Pharmacy (with phone number or street name):  [id number] - [place], [place] [address] Phone: [phone]  Fax: [phone]     Has the patient been seen for an appointment in the last year OR does the patient have an upcoming appointment? Yes.    Agent: Please be advised that RX refills may take up to 3 business days. We ask that you follow-up with your pharmacy.

## 2023-03-06 ENCOUNTER — Ambulatory Visit
Admission: RE | Admit: 2023-03-06 | Discharge: 2023-03-06 | Disposition: A | Discharge status: Home / Self Care | Payer: Medicare HMO | Source: Ambulatory Visit | Attending: Physical Medicine & Rehabilitation | Admitting: Physical Medicine & Rehabilitation

## 2023-03-06 DIAGNOSIS — M4126 Other idiopathic scoliosis, lumbar region: Secondary | ICD-10-CM | POA: Diagnosis not present

## 2023-03-06 DIAGNOSIS — M5442 Lumbago with sciatica, left side: Secondary | ICD-10-CM

## 2023-03-06 DIAGNOSIS — M48061 Spinal stenosis, lumbar region without neurogenic claudication: Secondary | ICD-10-CM | POA: Diagnosis not present

## 2023-03-06 DIAGNOSIS — M5441 Lumbago with sciatica, right side: Secondary | ICD-10-CM

## 2023-03-06 DIAGNOSIS — R531 Weakness: Secondary | ICD-10-CM | POA: Diagnosis not present

## 2023-03-06 DIAGNOSIS — M545 Low back pain, unspecified: Secondary | ICD-10-CM | POA: Diagnosis not present

## 2023-03-10 ENCOUNTER — Other Ambulatory Visit: Payer: Medicare HMO

## 2023-03-13 DIAGNOSIS — M5441 Lumbago with sciatica, right side: Secondary | ICD-10-CM | POA: Diagnosis not present

## 2023-03-13 DIAGNOSIS — M5416 Radiculopathy, lumbar region: Secondary | ICD-10-CM | POA: Diagnosis not present

## 2023-03-13 DIAGNOSIS — G8929 Other chronic pain: Secondary | ICD-10-CM | POA: Diagnosis not present

## 2023-03-13 DIAGNOSIS — M5442 Lumbago with sciatica, left side: Secondary | ICD-10-CM | POA: Diagnosis not present

## 2023-04-05 DIAGNOSIS — M5416 Radiculopathy, lumbar region: Secondary | ICD-10-CM | POA: Diagnosis not present

## 2023-04-05 DIAGNOSIS — R7303 Prediabetes: Secondary | ICD-10-CM | POA: Diagnosis not present

## 2023-04-23 ENCOUNTER — Ambulatory Visit
Admission: RE | Admit: 2023-04-23 | Discharge: 2023-04-23 | Disposition: A | Discharge status: Home / Self Care | Payer: Medicare HMO | Source: Ambulatory Visit | Attending: Nurse Practitioner | Admitting: Nurse Practitioner

## 2023-04-23 DIAGNOSIS — Z1231 Encounter for screening mammogram for malignant neoplasm of breast: Secondary | ICD-10-CM | POA: Insufficient documentation

## 2023-04-24 DIAGNOSIS — M5441 Lumbago with sciatica, right side: Secondary | ICD-10-CM | POA: Diagnosis not present

## 2023-04-24 DIAGNOSIS — M5416 Radiculopathy, lumbar region: Secondary | ICD-10-CM | POA: Diagnosis not present

## 2023-04-24 DIAGNOSIS — M5442 Lumbago with sciatica, left side: Secondary | ICD-10-CM | POA: Diagnosis not present

## 2023-04-24 DIAGNOSIS — G8929 Other chronic pain: Secondary | ICD-10-CM | POA: Diagnosis not present

## 2023-04-24 NOTE — Progress Notes (Signed)
Contacted via MyChart   Normal mammogram, may repeat in one year:)

## 2023-05-10 DIAGNOSIS — M5416 Radiculopathy, lumbar region: Secondary | ICD-10-CM | POA: Diagnosis not present

## 2023-05-10 DIAGNOSIS — R7303 Prediabetes: Secondary | ICD-10-CM | POA: Diagnosis not present

## 2023-05-23 DIAGNOSIS — M5442 Lumbago with sciatica, left side: Secondary | ICD-10-CM | POA: Diagnosis not present

## 2023-05-23 DIAGNOSIS — M5441 Lumbago with sciatica, right side: Secondary | ICD-10-CM | POA: Diagnosis not present

## 2023-05-23 DIAGNOSIS — G8929 Other chronic pain: Secondary | ICD-10-CM | POA: Diagnosis not present

## 2023-05-23 DIAGNOSIS — M5416 Radiculopathy, lumbar region: Secondary | ICD-10-CM | POA: Diagnosis not present

## 2023-06-05 DIAGNOSIS — M5451 Vertebrogenic low back pain: Secondary | ICD-10-CM | POA: Diagnosis not present

## 2023-06-05 DIAGNOSIS — M6281 Muscle weakness (generalized): Secondary | ICD-10-CM | POA: Diagnosis not present

## 2023-06-08 DIAGNOSIS — R2681 Unsteadiness on feet: Secondary | ICD-10-CM | POA: Diagnosis not present

## 2023-06-08 DIAGNOSIS — M6281 Muscle weakness (generalized): Secondary | ICD-10-CM | POA: Diagnosis not present

## 2023-06-08 DIAGNOSIS — M5451 Vertebrogenic low back pain: Secondary | ICD-10-CM | POA: Diagnosis not present

## 2023-06-13 DIAGNOSIS — R2681 Unsteadiness on feet: Secondary | ICD-10-CM | POA: Diagnosis not present

## 2023-06-13 DIAGNOSIS — M6281 Muscle weakness (generalized): Secondary | ICD-10-CM | POA: Diagnosis not present

## 2023-06-13 DIAGNOSIS — M5451 Vertebrogenic low back pain: Secondary | ICD-10-CM | POA: Diagnosis not present

## 2023-06-15 DIAGNOSIS — M6281 Muscle weakness (generalized): Secondary | ICD-10-CM | POA: Diagnosis not present

## 2023-06-15 DIAGNOSIS — M5451 Vertebrogenic low back pain: Secondary | ICD-10-CM | POA: Diagnosis not present

## 2023-06-15 DIAGNOSIS — R2681 Unsteadiness on feet: Secondary | ICD-10-CM | POA: Diagnosis not present

## 2023-06-20 DIAGNOSIS — M5451 Vertebrogenic low back pain: Secondary | ICD-10-CM | POA: Diagnosis not present

## 2023-06-20 DIAGNOSIS — M6281 Muscle weakness (generalized): Secondary | ICD-10-CM | POA: Diagnosis not present

## 2023-06-20 DIAGNOSIS — R2681 Unsteadiness on feet: Secondary | ICD-10-CM | POA: Diagnosis not present

## 2023-06-25 DIAGNOSIS — M6281 Muscle weakness (generalized): Secondary | ICD-10-CM | POA: Diagnosis not present

## 2023-06-25 DIAGNOSIS — R2681 Unsteadiness on feet: Secondary | ICD-10-CM | POA: Diagnosis not present

## 2023-06-25 DIAGNOSIS — M5451 Vertebrogenic low back pain: Secondary | ICD-10-CM | POA: Diagnosis not present

## 2023-06-27 DIAGNOSIS — R2681 Unsteadiness on feet: Secondary | ICD-10-CM | POA: Diagnosis not present

## 2023-06-27 DIAGNOSIS — M5451 Vertebrogenic low back pain: Secondary | ICD-10-CM | POA: Diagnosis not present

## 2023-06-27 DIAGNOSIS — M6281 Muscle weakness (generalized): Secondary | ICD-10-CM | POA: Diagnosis not present

## 2023-07-02 DIAGNOSIS — M6281 Muscle weakness (generalized): Secondary | ICD-10-CM | POA: Diagnosis not present

## 2023-07-02 DIAGNOSIS — M5451 Vertebrogenic low back pain: Secondary | ICD-10-CM | POA: Diagnosis not present

## 2023-07-02 DIAGNOSIS — R2681 Unsteadiness on feet: Secondary | ICD-10-CM | POA: Diagnosis not present

## 2023-07-04 DIAGNOSIS — R2681 Unsteadiness on feet: Secondary | ICD-10-CM | POA: Diagnosis not present

## 2023-07-04 DIAGNOSIS — M6281 Muscle weakness (generalized): Secondary | ICD-10-CM | POA: Diagnosis not present

## 2023-07-04 DIAGNOSIS — M5451 Vertebrogenic low back pain: Secondary | ICD-10-CM | POA: Diagnosis not present

## 2023-07-09 DIAGNOSIS — M6281 Muscle weakness (generalized): Secondary | ICD-10-CM | POA: Diagnosis not present

## 2023-07-09 DIAGNOSIS — M5451 Vertebrogenic low back pain: Secondary | ICD-10-CM | POA: Diagnosis not present

## 2023-07-09 DIAGNOSIS — R2681 Unsteadiness on feet: Secondary | ICD-10-CM | POA: Diagnosis not present

## 2023-07-11 DIAGNOSIS — M6281 Muscle weakness (generalized): Secondary | ICD-10-CM | POA: Diagnosis not present

## 2023-07-11 DIAGNOSIS — M5451 Vertebrogenic low back pain: Secondary | ICD-10-CM | POA: Diagnosis not present

## 2023-07-11 DIAGNOSIS — R2681 Unsteadiness on feet: Secondary | ICD-10-CM | POA: Diagnosis not present

## 2023-07-16 DIAGNOSIS — R2681 Unsteadiness on feet: Secondary | ICD-10-CM | POA: Diagnosis not present

## 2023-07-16 DIAGNOSIS — M5451 Vertebrogenic low back pain: Secondary | ICD-10-CM | POA: Diagnosis not present

## 2023-07-16 DIAGNOSIS — M6281 Muscle weakness (generalized): Secondary | ICD-10-CM | POA: Diagnosis not present

## 2023-08-05 NOTE — Patient Instructions (Signed)
Prediabetes Eating Plan Prediabetes is a condition that causes blood sugar (glucose) levels to be higher than normal. This increases the risk for developing type 2 diabetes (type 2 diabetes mellitus). Working with a health care provider or nutrition specialist (dietitian) to make diet and lifestyle changes can help prevent the onset of diabetes. These changes may help you: Control your blood glucose levels. Improve your cholesterol levels. Manage your blood pressure. What are tips for following this plan? Reading food labels Read food labels to check the amount of fat, salt (sodium), and sugar in prepackaged foods. Avoid foods that have: Saturated fats. Trans fats. Added sugars. Avoid foods that have more than 300 milligrams (mg) of sodium per serving. Limit your sodium intake to less than 2,300 mg each day. Shopping Avoid buying pre-made and processed foods. Avoid buying drinks with added sugar. Cooking Cook with olive oil. Do not use butter, lard, or ghee. Bake, broil, grill, steam, or boil foods. Avoid frying. Meal planning  Work with your dietitian to create an eating plan that is right for you. This may include tracking how many calories you take in each day. Use a food diary, notebook, or mobile application to track what you eat at each meal. Consider following a Mediterranean diet. This includes: Eating several servings of fresh fruits and vegetables each day. Eating fish at least twice a week. Eating one serving each day of whole grains, beans, nuts, and seeds. Using olive oil instead of other fats. Limiting alcohol. Limiting red meat. Using nonfat or low-fat dairy products. Consider following a plant-based diet. This includes dietary choices that focus on eating mostly vegetables and fruit, grains, beans, nuts, and seeds. If you have high blood pressure, you may need to limit your sodium intake or follow a diet such as the DASH (Dietary Approaches to Stop Hypertension) eating  plan. The DASH diet aims to lower high blood pressure. Lifestyle Set weight loss goals with help from your health care team. It is recommended that most people with prediabetes lose 7% of their body weight. Exercise for at least 30 minutes 5 or more days a week. Attend a support group or seek support from a mental health counselor. Take over-the-counter and prescription medicines only as told by your health care provider. What foods are recommended? Fruits Berries. Bananas. Apples. Oranges. Grapes. Papaya. Mango. Pomegranate. Kiwi. Grapefruit. Cherries. Vegetables Lettuce. Spinach. Peas. Beets. Cauliflower. Cabbage. Broccoli. Carrots. Tomatoes. Squash. Eggplant. Herbs. Peppers. Onions. Cucumbers. Brussels sprouts. Grains Whole grains, such as whole-wheat or whole-grain breads, crackers, cereals, and pasta. Unsweetened oatmeal. Bulgur. Barley. Quinoa. Brown rice. Corn or whole-wheat flour tortillas or taco shells. Meats and other proteins Seafood. Poultry without skin. Lean cuts of pork and beef. Tofu. Eggs. Nuts. Beans. Dairy Low-fat or fat-free dairy products, such as yogurt, cottage cheese, and cheese. Beverages Water. Tea. Coffee. Sugar-free or diet soda. Seltzer water. Low-fat or nonfat milk. Milk alternatives, such as soy or almond milk. Fats and oils Olive oil. Canola oil. Sunflower oil. Grapeseed oil. Avocado. Walnuts. Sweets and desserts Sugar-free or low-fat pudding. Sugar-free or low-fat ice cream and other frozen treats. Seasonings and condiments Herbs. Sodium-free spices. Mustard. Relish. Low-salt, low-sugar ketchup. Low-salt, low-sugar barbecue sauce. Low-fat or fat-free mayonnaise. The items listed above may not be a complete list of recommended foods and beverages. Contact a dietitian for more information. What foods are not recommended? Fruits Fruits canned with syrup. Vegetables Canned vegetables. Frozen vegetables with butter or cream sauce. Grains Refined white  flour and flour   products, such as bread, pasta, snack foods, and cereals. Meats and other proteins Fatty cuts of meat. Poultry with skin. Breaded or fried meat. Processed meats. Dairy Full-fat yogurt, cheese, or milk. Beverages Sweetened drinks, such as iced tea and soda. Fats and oils Butter. Lard. Ghee. Sweets and desserts Baked goods, such as cake, cupcakes, pastries, cookies, and cheesecake. Seasonings and condiments Spice mixes with added salt. Ketchup. Barbecue sauce. Mayonnaise. The items listed above may not be a complete list of foods and beverages that are not recommended. Contact a dietitian for more information. Where to find more information American Diabetes Association: www.diabetes.org Summary You may need to make diet and lifestyle changes to help prevent the onset of diabetes. These changes can help you control blood sugar, improve cholesterol levels, and manage blood pressure. Set weight loss goals with help from your health care team. It is recommended that most people with prediabetes lose 7% of their body weight. Consider following a Mediterranean diet. This includes eating plenty of fresh fruits and vegetables, whole grains, beans, nuts, seeds, fish, and low-fat dairy, and using olive oil instead of other fats. This information is not intended to replace advice given to you by your health care provider. Make sure you discuss any questions you have with your health care provider. Document Revised: 12/11/2019 Document Reviewed: 12/11/2019 Elsevier Patient Education  2024 Elsevier Inc.  

## 2023-08-08 ENCOUNTER — Encounter: Payer: Self-pay | Admitting: Nurse Practitioner

## 2023-08-08 ENCOUNTER — Ambulatory Visit: Payer: Medicare HMO | Admitting: Nurse Practitioner

## 2023-08-08 DIAGNOSIS — E782 Mixed hyperlipidemia: Secondary | ICD-10-CM

## 2023-08-08 DIAGNOSIS — I10 Essential (primary) hypertension: Secondary | ICD-10-CM | POA: Diagnosis not present

## 2023-08-08 DIAGNOSIS — R011 Cardiac murmur, unspecified: Secondary | ICD-10-CM | POA: Diagnosis not present

## 2023-08-08 DIAGNOSIS — Z23 Encounter for immunization: Secondary | ICD-10-CM

## 2023-08-08 DIAGNOSIS — M791 Myalgia, unspecified site: Secondary | ICD-10-CM

## 2023-08-08 DIAGNOSIS — R7303 Prediabetes: Secondary | ICD-10-CM

## 2023-08-08 DIAGNOSIS — T466X5A Adverse effect of antihyperlipidemic and antiarteriosclerotic drugs, initial encounter: Secondary | ICD-10-CM

## 2023-08-08 DIAGNOSIS — G894 Chronic pain syndrome: Secondary | ICD-10-CM | POA: Diagnosis not present

## 2023-08-08 DIAGNOSIS — I350 Nonrheumatic aortic (valve) stenosis: Secondary | ICD-10-CM | POA: Insufficient documentation

## 2023-08-08 LAB — BAYER DCA HB A1C WAIVED: HB A1C (BAYER DCA - WAIVED): 5.9 % — ABNORMAL HIGH (ref 4.8–5.6)

## 2023-08-08 MED ORDER — GABAPENTIN 300 MG PO CAPS: ORAL_CAPSULE | ORAL | 4 refills | Status: DC

## 2023-08-08 NOTE — Assessment & Plan Note (Signed)
At this time is tolerating Crestor on 3 day a week schedule, adjust this as needed.   If symptoms present then consider weekly dosing or change to Repatha.  Lipid panel today. 

## 2023-08-08 NOTE — Assessment & Plan Note (Signed)
Chronic, ongoing.  Is tolerating Crestor on 3 day week schedule + Zetia daily.  Will continue this and adjust as needed.  Obtain lipid panel today.

## 2023-08-08 NOTE — Progress Notes (Signed)
BP 127/75   Pulse (!) 59   Temp 98.3 F (36.8 C) (Oral)   Ht 5\' 4"  (1.626 m)   Wt 209 lb 12.8 oz (95.2 kg)   LMP  (LMP Unknown)   SpO2 97%   BMI 36.01 kg/m    Subjective:    Patient ID: Guilford Shi, female    DOB: January 19, 1951, 72 y.o.   MRN: [id number]  HPI: AYZEL STIGALL is a 72 y.o. female  Chief Complaint  Patient presents with   Hyperlipidemia   Hypertension   Prediabetes   HYPERTENSION / HYPERLIPIDEMIA Continues on Zetia, Hydrochlorothiazide, Crestor. Satisfied with current treatment? yes Duration of hypertension: chronic BP monitoring frequency: not checking BP range:  BP medication side effects: no Duration of hyperlipidemia: years Cholesterol medication side effects: no Cholesterol supplements: none Medication compliance: good compliance Aspirin: yes Recent stressors: no Recurrent headaches: no Visual changes: no Palpitations: no Dyspnea: no  Chest pain: no Lower extremity edema: occasional to feet Dizzy/lightheaded: no   Impaired Fasting Glucose HbA1C:  Lab Results  Component Value Date   HGBA1C 6.3 (H) 02/05/2023  Duration of elevated blood sugar:  Polydipsia: no Polyuria: no Weight change: no Visual disturbance: no Glucose Monitoring: no    Accucheck frequency: Not Checking    Fasting glucose:     Post prandial:  Diabetic Education: Not Completed Family history of diabetes: yes    BACK PAIN Followed with neurosurgery at [facility] as needed and received injections.  Currently is seeing physiatry, Dr. Mariah Milling, and receives steroid injections -- last 05/10/23.  Is taking Gabapentin 3 times a day + Baclofen at bedtime + Meloxicam daily.   MRI lumbar spine done 03/06/23 and continues to show moderate and severe spinal stenosis.  Bone density on 10/09/19 was normal.  She has been walking 1 hour a day at present. Duration: years Mechanism of injury: no trauma Location: L>R, midline and low back + left hip Onset:  gradual Severity: stable -- daily pain is a 3-4/10 Aggravating factors: movement, walking and bending -- walking especially Alleviating factors: epidural, Gabapentin Status: stable Treatments attempted: rest, heat and aleve, medications noted above -- epidural  Relief with NSAIDs?: moderate Nighttime pain: none Paresthesias / decreased sensation:  no Bowel / bladder incontinence:  no Fevers:  no Dysuria / urinary frequency:  no  Currently situational mood changes due to election results.      08/08/2023    8:57 AM 02/05/2023    1:21 PM 08/21/2022   10:56 AM 08/21/2022   10:49 AM 01/25/2022    9:12 AM  Depression screen PHQ 2/9  Decreased Interest 0 0 0 0 0  Down, Depressed, Hopeless 1 0 0 0 0  PHQ - 2 Score 1 0 0 0 0  Altered sleeping 1 2 1 1 1   Tired, decreased energy 1 2 0 0 1  Change in appetite 3 3 0 0 1  Feeling bad or failure about yourself  0 0 0 0 0  Trouble concentrating 1 0 0 0 0  Moving slowly or fidgety/restless 1 0 0 0 0  Suicidal thoughts 0 0 0 0 0  PHQ-9 Score 8 7 1 1 3   Difficult doing work/chores Somewhat difficult Not difficult at all Not difficult at all Not difficult at all Not difficult at all       08/08/2023    8:57 AM 02/05/2023    1:21 PM 08/22/2022   10:46 AM 01/25/2022    9:12 AM  GAD  7 : Generalized Anxiety Score  Nervous, Anxious, on Edge 0 0 0 0  Control/stop worrying 0 0 0 0  Worry too much - different things 1 0 0 0  Trouble relaxing 1 0 0 0  Restless 0 0 0 0  Easily annoyed or irritable 1 0 0 0  Afraid - awful might happen 1 0 0 0  Total GAD 7 Score 4 0 0 0  Anxiety Difficulty Not difficult at all Not difficult at all Not difficult at all Not difficult at all   Relevant past medical, surgical, family and social history reviewed and updated as indicated. Interim medical history since our last visit reviewed. Allergies and medications reviewed and updated.  Review of Systems  Constitutional:  Negative for activity change, appetite  change, diaphoresis, fatigue and fever.  Respiratory:  Negative for cough, chest tightness and shortness of breath.   Cardiovascular:  Negative for chest pain, palpitations and leg swelling.  Gastrointestinal: Negative.   Endocrine: Negative.   Musculoskeletal:  Positive for back pain.  Neurological: Negative.   Psychiatric/Behavioral: Negative.      Per HPI unless specifically indicated above     Objective:    BP 127/75   Pulse (!) 59   Temp 98.3 F (36.8 C) (Oral)   Ht 5\' 4"  (1.626 m)   Wt 209 lb 12.8 oz (95.2 kg)   LMP  (LMP Unknown)   SpO2 97%   BMI 36.01 kg/m   Wt Readings from Last 3 Encounters:  08/08/23 209 lb 12.8 oz (95.2 kg)  02/05/23 202 lb 3.2 oz (91.7 kg)  08/22/22 217 lb (98.4 kg)    Physical Exam Vitals and nursing note reviewed.  Constitutional:      General: She is awake. She is not in acute distress.    Appearance: Normal appearance. She is well-developed and well-groomed. She is obese. She is not ill-appearing or toxic-appearing.  HENT:     Head: Normocephalic.     Right Ear: Hearing and external ear normal.     Left Ear: Hearing and external ear normal.  Eyes:     General: Lids are normal.        Right eye: No discharge.        Left eye: No discharge.     Conjunctiva/sclera: Conjunctivae normal.     Pupils: Pupils are equal, round, and reactive to light.  Neck:     Thyroid: No thyromegaly.     Vascular: No carotid bruit.  Cardiovascular:     Rate and Rhythm: Normal rate and regular rhythm.     Heart sounds: Murmur heard.     Systolic murmur is present with a grade of 2/6.     No gallop.     Comments: Soft, holosystolic murmur heard best pulmonic area. Pulmonary:     Effort: Pulmonary effort is normal. No accessory muscle usage or respiratory distress.     Breath sounds: Normal breath sounds.  Abdominal:     General: Bowel sounds are normal. There is no distension.     Palpations: Abdomen is soft.     Tenderness: There is no abdominal  tenderness.  Musculoskeletal:     Cervical back: Normal range of motion and neck supple.     Right lower leg: No edema.     Left lower leg: No edema.  Lymphadenopathy:     Cervical: No cervical adenopathy.  Skin:    General: Skin is warm and dry.  Neurological:     Mental  Status: She is alert and oriented to person, place, and time.     Deep Tendon Reflexes: Reflexes are normal and symmetric.     Reflex Scores:      Brachioradialis reflexes are 2+ on the right side and 2+ on the left side.      Patellar reflexes are 2+ on the right side and 2+ on the left side. Psychiatric:        Attention and Perception: Attention normal.        Mood and Affect: Mood normal.        Speech: Speech normal.        Behavior: Behavior normal. Behavior is cooperative.        Thought Content: Thought content normal.    Results for orders placed or performed in visit on 02/05/23  Bayer DCA Hb A1c Waived  Result Value Ref Range   HB A1C (BAYER DCA - WAIVED) 6.3 (H) 4.8 - 5.6 %  Microalbumin, Urine Waived  Result Value Ref Range   Microalb, Ur Waived 30 (H) 0 - 19 mg/L   Creatinine, Urine Waived 200 10 - 300 mg/dL   Microalb/Creat Ratio <30 <30 mg/g  CBC with Differential/Platelet  Result Value Ref Range   WBC 6.1 3.4 - 10.8 x10E3/uL   RBC 4.85 3.77 - 5.28 x10E6/uL   Hemoglobin 14.0 11.1 - 15.9 g/dL   Hematocrit 16.1 09.6 - 46.6 %   MCV 90 79 - 97 fL   MCH 28.9 26.6 - 33.0 pg   MCHC 32.2 31.5 - 35.7 g/dL   RDW 04.5 40.9 - 81.1 %   Platelets 319 150 - 450 x10E3/uL   Neutrophils 53 Not Estab. %   Lymphs 33 Not Estab. %   Monocytes 7 Not Estab. %   Eos 6 Not Estab. %   Basos 1 Not Estab. %   Neutrophils Absolute 3.2 1.4 - 7.0 x10E3/uL   Lymphocytes Absolute 2.0 0.7 - 3.1 x10E3/uL   Monocytes Absolute 0.4 0.1 - 0.9 x10E3/uL   EOS (ABSOLUTE) 0.4 0.0 - 0.4 x10E3/uL   Basophils Absolute 0.1 0.0 - 0.2 x10E3/uL   Immature Granulocytes 0 Not Estab. %   Immature Grans (Abs) 0.0 0.0 - 0.1 x10E3/uL   Comprehensive metabolic panel  Result Value Ref Range   Glucose 88 70 - 99 mg/dL   BUN 13 8 - 27 mg/dL   Creatinine, Ser 9.14 0.57 - 1.00 mg/dL   eGFR 77 >78 GN/FAO/1.30   BUN/Creatinine Ratio 16 12 - 28   Sodium 140 134 - 144 mmol/L   Potassium 4.1 3.5 - 5.2 mmol/L   Chloride 99 96 - 106 mmol/L   CO2 26 20 - 29 mmol/L   Calcium 9.5 8.7 - 10.3 mg/dL   Total Protein 7.3 6.0 - 8.5 g/dL   Albumin 4.2 3.9 - 4.9 g/dL   Globulin, Total 3.1 1.5 - 4.5 g/dL   Albumin/Globulin Ratio 1.4 1.2 - 2.2   Bilirubin Total 0.4 0.0 - 1.2 mg/dL   Alkaline Phosphatase 81 44 - 121 IU/L   AST 18 0 - 40 IU/L   ALT 21 0 - 32 IU/L  Lipid Panel w/o Chol/HDL Ratio  Result Value Ref Range   Cholesterol, Total 150 100 - 199 mg/dL   Triglycerides 95 0 - 149 mg/dL   HDL 48 >86 mg/dL   VLDL Cholesterol Cal 18 5 - 40 mg/dL   LDL Chol Calc (NIH) 84 0 - 99 mg/dL  TSH  Result Value Ref Range  TSH 0.743 0.450 - 4.500 uIU/mL  VITAMIN D 25 Hydroxy (Vit-D Deficiency, Fractures)  Result Value Ref Range   Vit D, 25-Hydroxy 40.1 30.0 - 100.0 ng/mL      Assessment & Plan:   Problem List Items Addressed This Visit       Cardiovascular and Mediastinum   Hypertension (Chronic)    Chronic, stable.  BP at goal today. Continue low dose HCTZ and adjust as needed. Recommend she monitor BP at least a few mornings a week at home and document.  DASH diet at home.  Labs today: CMP.  Urine ALB 30 in May 2024, may benefit from ACE or ARB in future.  Return in 6 months.       Relevant Orders   Comprehensive metabolic panel     Other   Chronic pain syndrome (Chronic)    To lower back, following with physiatry. Will maintain this collaboration.  Recent notes reviewed.      Relevant Medications   gabapentin (NEURONTIN) 300 MG capsule   Hyperlipidemia (Chronic)    Chronic, ongoing.  Is tolerating Crestor on 3 day week schedule + Zetia daily.  Will continue this and adjust as needed.  Obtain lipid panel today.       Relevant Orders   Comprehensive metabolic panel   Lipid Panel w/o Chol/HDL Ratio   Morbid obesity (HCC) - Primary (Chronic)    BMI 36.01 with HTN and Prediabetes.  Recommended eating smaller high protein, low fat meals more frequently and exercising 30 mins a day 5 times a week with a goal of 10-15lb weight loss in the next 3 months. Patient voiced their understanding and motivation to adhere to these recommendations.       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   If symptoms present then consider weekly dosing or change to Repatha.  Lipid panel today.      Relevant Orders   Comprehensive metabolic panel   Lipid Panel w/o Chol/HDL Ratio   Prediabetes (Chronic)    A1c trend down to 5.9% today, she continues to be between 5.6 to 6.3% on review.  Will adjust plan of care and add medication as needed.  Continue diet focus at this time.  Urine ALB 22 Feb 2023, consider ACE or ARB in future.      Relevant Orders   Bayer DCA Hb A1c Waived   Heart murmur    Systolic, no symptoms.  Monitor and obtain echo as needed.        Follow up plan: Return in about 6 months (around 02/05/2024) for Annual physical after 02/05/24 + needs Medicare Wellness with nurse after 08/22/23.

## 2023-08-08 NOTE — Assessment & Plan Note (Signed)
A1c trend down to 5.9% today, she continues to be between 5.6 to 6.3% on review.  Will adjust plan of care and add medication as needed.  Continue diet focus at this time.  Urine ALB 22 Feb 2023, consider ACE or ARB in future.

## 2023-08-08 NOTE — Assessment & Plan Note (Signed)
Systolic, no symptoms.  Monitor and obtain echo as needed.

## 2023-08-08 NOTE — Assessment & Plan Note (Signed)
To lower back, following with physiatry. Will maintain this collaboration.  Recent notes reviewed.

## 2023-08-08 NOTE — Assessment & Plan Note (Signed)
Chronic, stable.  BP at goal today. Continue low dose HCTZ and adjust as needed. Recommend she monitor BP at least a few mornings a week at home and document.  DASH diet at home.  Labs today: CMP.  Urine ALB 30 in May 2024, may benefit from ACE or ARB in future.  Return in 6 months.

## 2023-08-08 NOTE — Assessment & Plan Note (Signed)
BMI 36.01 with HTN and Prediabetes.  Recommended eating smaller high protein, low fat meals more frequently and exercising 30 mins a day 5 times a week with a goal of 10-15lb weight loss in the next 3 months. Patient voiced their understanding and motivation to adhere to these recommendations.

## 2023-08-09 LAB — COMPREHENSIVE METABOLIC PANEL
ALT: 21 [IU]/L (ref 0–32)
AST: 20 [IU]/L (ref 0–40)
Albumin: 4.3 g/dL (ref 3.9–4.9)
Alkaline Phosphatase: 73 [IU]/L (ref 44–121)
BUN/Creatinine Ratio: 16 (ref 12–28)
BUN: 14 mg/dL (ref 8–27)
Bilirubin Total: 0.6 mg/dL (ref 0.0–1.2)
CO2: 27 mmol/L (ref 20–29)
Calcium: 9.9 mg/dL (ref 8.7–10.3)
Chloride: 99 mmol/L (ref 96–106)
Creatinine, Ser: 0.87 mg/dL (ref 0.57–1.00)
Globulin, Total: 2.4 g/dL (ref 1.5–4.5)
Glucose: 99 mg/dL (ref 70–99)
Potassium: 3.9 mmol/L (ref 3.5–5.2)
Sodium: 141 mmol/L (ref 134–144)
Total Protein: 6.7 g/dL (ref 6.0–8.5)
eGFR: 72 mL/min/{1.73_m2} (ref >59)

## 2023-08-09 LAB — LIPID PANEL W/O CHOL/HDL RATIO
Cholesterol, Total: 159 mg/dL (ref 100–199)
HDL: 53 mg/dL (ref >39)
LDL Chol Calc (NIH): 84 mg/dL (ref 0–99)
Triglycerides: 126 mg/dL (ref 0–149)
VLDL Cholesterol Cal: 22 mg/dL (ref 5–40)

## 2023-08-09 NOTE — Progress Notes (Signed)
Contacted via MyChart   Good evening Kiara Parker, your labs have returned and overall remain stable.  Continue all current medications.  Any questions? Keep being amazing!!  Thank you for allowing me to participate in your care.  I appreciate you. Kindest regards, Shadawn Hanaway

## 2023-08-20 ENCOUNTER — Ambulatory Visit: Payer: Medicare HMO | Admitting: Emergency Medicine

## 2023-08-20 VITALS — Ht 64.0 in | Wt 202.0 lb

## 2023-08-20 DIAGNOSIS — Z0000 Encounter for general adult medical examination without abnormal findings: Secondary | ICD-10-CM | POA: Diagnosis not present

## 2023-08-20 NOTE — Patient Instructions (Signed)
Kiara Parker , Thank you for taking time to come for your Medicare Wellness Visit. I appreciate your ongoing commitment to your health goals. Please review the following plan we discussed and let me know if I can assist you in the future.   Referrals/Orders/Follow-Ups/Clinician Recommendations: Get the shingles vaccine as discussed.  This is a list of the screening recommended for you and due dates:  Health Maintenance  Topic Date Due   Colon Cancer Screening  11/27/2023   Zoster (Shingles) Vaccine (1 of 2) 11/08/2023*   COVID-19 Vaccine (8 - 2023-24 season) 10/02/2023   Mammogram  04/22/2024   Medicare Annual Wellness Visit  08/19/2024   DTaP/Tdap/Td vaccine (2 - Td or Tdap) 09/12/2026   DEXA scan (bone density measurement)  10/08/2029   Pneumonia Vaccine  Completed   Flu Shot  Completed   Hepatitis C Screening  Completed   HPV Vaccine  Aged Out  *Topic was postponed. The date shown is not the original due date.    Advanced directives: (Declined) Advance directive discussed with you today. Even though you declined this today, please call our office should you change your mind, and we can give you the proper paperwork for you to fill out.  Next Medicare Annual Wellness Visit scheduled for next year: No, patient declined.  Fall Prevention in the Home, Adult Falls can cause injuries and affect people of all ages. There are many simple things that you can do to make your home safe and to help prevent falls. If you need it, ask for help making these changes. What actions can I take to prevent falls? General information Use good lighting in all rooms. Make sure to: Replace any light bulbs that burn out. Turn on lights if it is dark and use night-lights. Keep items that you use often in easy-to-reach places. Lower the shelves around your home if needed. Move furniture so that there are clear paths around it. Do not keep throw rugs or other things on the floor that can make you trip. If  any of your floors are uneven, fix them. Add color or contrast paint or tape to clearly mark and help you see: Grab bars or handrails. First and last steps of staircases. Where the edge of each step is. If you use a ladder or stepladder: Make sure that it is fully opened. Do not climb a closed ladder. Make sure the sides of the ladder are locked in place. Have someone hold the ladder while you use it. Know where your pets are as you move through your home. What can I do in the bathroom?     Keep the floor dry. Clean up any water that is on the floor right away. Remove soap buildup in the bathtub or shower. Buildup makes bathtubs and showers slippery. Use non-skid mats or decals on the floor of the bathtub or shower. Attach bath mats securely with double-sided, non-slip rug tape. If you need to sit down while you are in the shower, use a non-slip stool. Install grab bars by the toilet and in the bathtub and shower. Do not use towel bars as grab bars. What can I do in the bedroom? Make sure that you have a light by your bed that is easy to reach. Do not use any sheets or blankets on your bed that hang to the floor. Have a firm bench or chair with side arms that you can use for support when you get dressed. What can I do in the  kitchen? Clean up any spills right away. If you need to reach something above you, use a sturdy step stool that has a grab bar. Keep electrical cables out of the way. Do not use floor polish or wax that makes floors slippery. What can I do with my stairs? Do not leave anything on the stairs. Make sure that you have a light switch at the top and the bottom of the stairs. Have them installed if you do not have them. Make sure that there are handrails on both sides of the stairs. Fix handrails that are broken or loose. Make sure that handrails are as long as the staircases. Install non-slip stair treads on all stairs in your home if they do not have carpet. Avoid  having throw rugs at the top or bottom of stairs, or secure the rugs with carpet tape to prevent them from moving. Choose a carpet design that does not hide the edge of steps on the stairs. Make sure that carpet is firmly attached to the stairs. Fix any carpet that is loose or worn. What can I do on the outside of my home? Use bright outdoor lighting. Repair the edges of walkways and driveways and fix any cracks. Clear paths of anything that can make you trip, such as tools or rocks. Add color or contrast paint or tape to clearly mark and help you see high doorway thresholds. Trim any bushes or trees on the main path into your home. Check that handrails are securely fastened and in good repair. Both sides of all steps should have handrails. Install guardrails along the edges of any raised decks or porches. Have leaves, snow, and ice cleared regularly. Use sand, salt, or ice melt on walkways during winter months if you live where there is ice and snow. In the garage, clean up any spills right away, including grease or oil spills. What other actions can I take? Review your medicines with your health care provider. Some medicines can make you confused or feel dizzy. This can increase your chance of falling. Wear closed-toe shoes that fit well and support your feet. Wear shoes that have rubber soles and low heels. Use a cane, walker, scooter, or crutches that help you move around if needed. Talk with your provider about other ways that you can decrease your risk of falls. This may include seeing a physical therapist to learn to do exercises to improve movement and strength. Where to find more information Centers for Disease Control and Prevention, STEADI: TonerPromos.no [employer] on Aging: BaseRingTones.pl National Institute on Aging: BaseRingTones.pl Contact a health care provider if: You are afraid of falling at home. You feel weak, drowsy, or dizzy at home. You fall at home. Get help right away if  you: Lose consciousness or have trouble moving after a fall. Have a fall that causes a head injury. These symptoms may be an emergency. Get help right away. Call 911. Do not wait to see if the symptoms will go away. Do not drive yourself to the hospital. This information is not intended to replace advice given to you by your health care provider. Make sure you discuss any questions you have with your health care provider. Document Revised: 05/15/2022 Document Reviewed: 05/15/2022 Elsevier Patient Education  2024 [employer].

## 2023-08-20 NOTE — Progress Notes (Signed)
Subjective:   Kiara Parker is a 72 y.o. female who presents for Medicare Annual (Subsequent) preventive examination.  Visit Complete: Virtual I connected with  Guilford Shi on 08/20/23 by a audio enabled telemedicine application and verified that I am speaking with the correct person using two identifiers.  Patient Location: Home  Provider Location: Home Office  I discussed the limitations of evaluation and management by telemedicine. The patient expressed understanding and agreed to proceed.  Vital Signs: Because this visit was a virtual/telehealth visit, some criteria may be missing or patient reported. Any vitals not documented were not able to be obtained and vitals that have been documented are patient reported.   Cardiac Risk Factors include: advanced age (>30men, >67 women);hypertension;dyslipidemia;obesity (BMI >30kg/m2);Other (see comment), Risk factor comments: prediabetic     Objective:    Today's Vitals   08/20/23 1125  Weight: 202 lb (91.6 kg)  Height: 5\' 4"  (1.626 m)  PainSc: 4    Body mass index is 34.67 kg/m.     08/20/2023   11:38 AM 08/21/2022   10:43 AM 08/09/2021   12:55 PM 03/18/2021    1:07 PM 08/06/2020    2:38 PM 11/27/2018   12:16 PM 12/04/2017    2:50 PM  Advanced Directives  Does Patient Have a Medical Advance Directive? No No No No No No No  Would patient like information on creating a medical advance directive? No - Patient declined Yes (MAU/Ambulatory/Procedural Areas - Information given) No - Patient declined No - Patient declined  No - Patient declined     Current Medications (verified) Outpatient Encounter Medications as of 08/20/2023  Medication Sig   Ascorbic Acid (VITAMIN C) 1000 MG tablet Take 1,000 mg by mouth daily.   baclofen (LIORESAL) 10 MG tablet Take 0.5 tablets (5 mg total) by mouth at bedtime.   Cholecalciferol 25 MCG (1000 UT) capsule Take 2,000 Units by mouth daily.   cyanocobalamin (VITAMIN B12) 1000 MCG tablet  Take 1,000 mcg by mouth daily.   ezetimibe (ZETIA) 10 MG tablet Take 1 tablet (10 mg total) by mouth daily.   gabapentin (NEURONTIN) 300 MG capsule TAKE 1 CAPSULE BY MOUTH THREE TIMES A DAY   hydrochlorothiazide (HYDRODIURIL) 12.5 MG tablet Take 1 tablet (12.5 mg total) by mouth daily.   meloxicam (MOBIC) 15 MG tablet Take 1 tablet (15 mg total) by mouth daily.   Multiple Vitamin (MULTIVITAMIN) tablet Take 1 tablet by mouth daily.   rosuvastatin (CRESTOR) 5 MG tablet TAKE 1 TABLET (5 MG TOTAL) BY MOUTH 3 (THREE) TIMES A WEEK   No facility-administered encounter medications on file as of 08/20/2023.    Allergies (verified) Atorvastatin, Clavulanic acid, Erythromycin, Penicillins, Pravastatin, and Zithromax [azithromycin]   History: Past Medical History:  Diagnosis Date   Chronic pain syndrome    Hyperlipidemia    Hypertension    Lumbago    Spinal stenosis    Past Surgical History:  Procedure Laterality Date   COLONOSCOPY WITH PROPOFOL N/A 11/27/2018   Procedure: COLONOSCOPY WITH PROPOFOL;  Surgeon: Wyline Mood, MD;  Location: [facility];  Service: Gastroenterology;  Laterality: N/A;   TUBAL LIGATION     Family History  Problem Relation Age of Onset   Cancer Mother        breast   Arthritis Mother    Osteoporosis Mother    Breast cancer Mother 19   Heart disease Father    Lung disease Father    Cancer Sister  lymphoma   Multiple sclerosis Sister    Cancer Maternal Grandmother        lymphoma   Social History   Socioeconomic History   Marital status: Married    Spouse name: Henreitta Cea   Number of children: 1   Years of education: Not on file   Highest education level: Not on file  Occupational History   Occupation: retired  Tobacco Use   Smoking status: Never   Smokeless tobacco: Never  Vaping Use   Vaping status: Never Used  Substance and Sexual Activity   Alcohol use: No   Drug use: No   Sexual activity: Yes    Birth control/protection: None  Other  Topics Concern   Not on file  Social History Narrative   Not on file   Social Determinants of Health   Financial Resource Strain: Low Risk  (08/20/2023)   Overall Financial Resource Strain (CARDIA)    Difficulty of Paying Living Expenses: Not hard at all  Food Insecurity: No Food Insecurity (08/20/2023)   Hunger Vital Sign    Worried About Running Out of Food in the Last Year: Never true    Ran Out of Food in the Last Year: Never true  Transportation Needs: No Transportation Needs (08/20/2023)   PRAPARE - Administrator, Civil Service (Medical): No    Lack of Transportation (Non-Medical): No  Physical Activity: Sufficiently Active (08/20/2023)   Exercise Vital Sign    Days of Exercise per Week: 7 days    Minutes of Exercise per Session: 60 min  Stress: No Stress Concern Present (08/20/2023)   [employer] of Occupational Health - Occupational Stress Questionnaire    Feeling of Stress : Only a little  Social Connections: Moderately Integrated (08/20/2023)   Social Connection and Isolation Panel [NHANES]    Frequency of Communication with Friends and Family: More than three times a week    Frequency of Social Gatherings with Friends and Family: More than three times a week    Attends Religious Services: More than 4 times per year    Active Member of [employer] or Organizations: No    Attends Engineer, structural: Never    Marital Status: Married    Tobacco Counseling Counseling given: Not Answered   Clinical Intake:  Pre-visit preparation completed: Yes  Pain : 0-10 Pain Score: 4  Pain Type: Chronic pain Pain Location: Back Pain Descriptors / Indicators: Aching     BMI - recorded: 34.67 Nutritional Status: BMI > 30  Obese Nutritional Risks: None Diabetes: No  How often do you need to have someone help you when you read instructions, pamphlets, or other written materials from your doctor or pharmacy?: 1 - Never  Interpreter Needed?:  No  Information entered by :: Tora Kindred, CMA   Activities of Daily Living    08/20/2023   11:27 AM 02/05/2023    1:24 PM  In your present state of health, do you have any difficulty performing the following activities:  Hearing? 0 0  Vision? 0 0  Difficulty concentrating or making decisions? 0 0  Walking or climbing stairs? 1 0  Comment uses cane prn   Dressing or bathing? 0 0  Doing errands, shopping? 0 0  Preparing Food and eating ? N   Using the Toilet? N   In the past six months, have you accidently leaked urine? Y   Comment wears pad   Do you have problems with loss of bowel control? N  Managing your Medications? N   Managing your Finances? N   Housekeeping or managing your Housekeeping? Y   Comment needs housekeeper     Patient Care Team: Marjie Skiff, NP as PCP - General (Nurse Practitioner)  Indicate any recent Medical Services you may have received from other than [facility] providers in the past year (date may be approximate).     Assessment:   This is a routine wellness examination for [place].  Hearing/Vision screen Hearing Screening - Comments:: Denies hearing loss Vision Screening - Comments:: Gets eye exams   Goals Addressed               This Visit's Progress     Weight (lb) < 180 lb (81.6 kg) (pt-stated)   202 lb (91.6 kg)     Depression Screen    08/20/2023   11:35 AM 08/08/2023    8:57 AM 02/05/2023    1:21 PM 08/21/2022   10:56 AM 08/21/2022   10:49 AM 01/25/2022    9:12 AM 08/09/2021   12:55 PM  PHQ 2/9 Scores  PHQ - 2 Score 0 1 0 0 0 0 0  PHQ- 9 Score 1 8 7 1 1 3      Fall Risk    08/20/2023   11:39 AM 08/08/2023    8:57 AM 02/05/2023    1:22 PM 08/21/2022   10:43 AM 01/25/2022    9:12 AM  Fall Risk   Falls in the past year? 0 0 0 0 0  Number falls in past yr: 0 0 0 0 0  Injury with Fall? 0 0 0 0 0  Risk for fall due to : Impaired balance/gait;Impaired mobility No Fall Risks   No Fall Risks  Follow up Falls prevention  discussed;Education provided;Falls evaluation completed Falls evaluation completed Follow up appointment Falls evaluation completed;Education provided;Falls prevention discussed Education provided    MEDICARE RISK AT HOME: Medicare Risk at Home Any stairs in or around the home?: Yes If so, are there any without handrails?: No Home free of loose throw rugs in walkways, pet beds, electrical cords, etc?: No Adequate lighting in your home to reduce risk of falls?: Yes Life alert?: No Use of a cane, walker or w/c?: Yes (cane prn) Grab bars in the bathroom?: No Shower chair or bench in shower?: No Elevated toilet seat or a handicapped toilet?: Yes  TIMED UP AND GO:  Was the test performed?  No    Cognitive Function:        08/20/2023   11:41 AM 08/21/2022   10:46 AM 08/06/2020    2:42 PM  6CIT Screen  What Year? 0 points 0 points 0 points  What month? 0 points 0 points 0 points  What time? 0 points 0 points 0 points  Count back from 20 2 points 2 points 2 points  Months in reverse 0 points 0 points 0 points  Repeat phrase 0 points 0 points 0 points  Total Score 2 points 2 points 2 points    Immunizations Immunization History  Administered Date(s) Administered   Fluad Quad(high Dose 65+) 07/06/2021, 11/27/2022   Influenza, High Dose Seasonal PF 07/24/2018, 06/27/2019, 07/19/2021, 08/07/2023   Influenza,inj,Quad PF,6+ Mos 09/12/2016   Influenza-Unspecified 07/02/2020   PFIZER Comirnaty(Gray Top)Covid-19 Tri-Sucrose Vaccine 02/07/2021   PFIZER(Purple Top)SARS-COV-2 Vaccination 11/18/2019, 12/09/2019, 07/06/2020, 08/07/2023   Pfizer Covid-19 Vaccine Bivalent Booster 7yrs & up 06/17/2021   Pfizer(Comirnaty)Fall Seasonal Vaccine 12 years and older 11/27/2022   Pneumococcal Conjugate-13 07/24/2018  Pneumococcal Polysaccharide-23 08/23/2019   Tdap 09/12/2016    TDAP status: Up to date  Flu Vaccine status: Up to date  Pneumococcal vaccine status: Up to date  Covid-19  vaccine status: Completed vaccines  Qualifies for Shingles Vaccine? Yes   Zostavax completed No   Shingrix Completed?: No.    Education has been provided regarding the importance of this vaccine. Patient has been advised to call insurance company to determine out of pocket expense if they have not yet received this vaccine. Advised may also receive vaccine at local pharmacy or Health Dept. Verbalized acceptance and understanding.  Screening Tests Health Maintenance  Topic Date Due   Colonoscopy  11/27/2023   Zoster Vaccines- Shingrix (1 of 2) 11/08/2023 (Originally 08/15/1970)   COVID-19 Vaccine (8 - 2023-24 season) 10/02/2023   Medicare Annual Wellness (AWV)  08/19/2024   MAMMOGRAM  04/22/2025   DTaP/Tdap/Td (2 - Td or Tdap) 09/12/2026   DEXA SCAN  10/08/2029   Pneumonia Vaccine 43+ Years old  Completed   INFLUENZA VACCINE  Completed   Hepatitis C Screening  Completed   HPV VACCINES  Aged Out    Health Maintenance  Health Maintenance Due  Topic Date Due   Colonoscopy  11/27/2023    Colorectal cancer screening: Type of screening: Colonoscopy. Completed 11/27/18. Repeat every 5 years  Mammogram status: Completed 04/23/23. Repeat every year  Bone Density status: Completed 10/09/19. Results reflect: Bone density results: NORMAL. Repeat every 10 years.  Lung Cancer Screening: (Low Dose CT Chest recommended if Age 62-80 years, 20 pack-year currently smoking OR have quit w/in 15years.) does not qualify.   Lung Cancer Screening Referral: n/a  Additional Screening:  Hepatitis C Screening: does not qualify; Completed 12/28/20  Vision Screening: Recommended annual ophthalmology exams for early detection of glaucoma and other disorders of the eye.  Dental Screening: Recommended annual dental exams for proper oral hygiene   Community Resource Referral / Chronic Care Management: CRR required this visit?  No   CCM required this visit?  No     Plan:     I have personally reviewed  and noted the following in the patient's chart:   Medical and social history Use of alcohol, tobacco or illicit drugs  Current medications and supplements including opioid prescriptions. Patient is not currently taking opioid prescriptions. Functional ability and status Nutritional status Physical activity Advanced directives List of other physicians Hospitalizations, surgeries, and ER visits in previous 12 months Vitals Screenings to include cognitive, depression, and falls Referrals and appointments  In addition, I have reviewed and discussed with patient certain preventive protocols, quality metrics, and best practice recommendations. A written personalized care plan for preventive services as well as general preventive health recommendations were provided to patient.     Tora Kindred, CMA   08/20/2023   After Visit Summary: (MyChart) Due to this being a telephonic visit, the after visit summary with patients personalized plan was offered to patient via MyChart   Nurse Notes:  6 CIT Score - 2 Planning to get the shingles vaccine in the next few weeks Patient declined to schedule AWV for 2025.

## 2023-10-06 ENCOUNTER — Other Ambulatory Visit: Payer: Self-pay | Admitting: Nurse Practitioner

## 2023-10-08 NOTE — Telephone Encounter (Signed)
 Requested Prescriptions  Pending Prescriptions Disp Refills   baclofen  (LIORESAL ) 10 MG tablet [Pharmacy Med Name: BACLOFEN  10 MG TABLET] 45 tablet 1    Sig: TAKE 0.5 TABLETS BY MOUTH AT BEDTIME.     Analgesics:  Muscle Relaxants - baclofen  Passed - 10/08/2023  5:53 PM      Passed - Cr in normal range and within 180 days    Creatinine, Ser  Date Value Ref Range Status  08/08/2023 0.87 0.57 - 1.00 mg/dL Final         Passed - eGFR is 30 or above and within 180 days    GFR calc Af Amer  Date Value Ref Range Status  10/29/2020 71 >59 mL/min/1.73 Final    Comment:    **In accordance with recommendations from the NKF-ASN Task force,**   [facility] is in the process of updating its eGFR calculation to the   2021 CKD-EPI creatinine equation that estimates kidney function   without a race variable.    GFR calc non Af Amer  Date Value Ref Range Status  10/29/2020 62 >59 mL/min/1.73 Final   eGFR  Date Value Ref Range Status  08/08/2023 72 >59 mL/min/1.73 Final         Passed - Valid encounter within last 6 months    Recent Outpatient Visits           2 months ago Morbid obesity (HCC)   [place] [facility] [place], Melanie T, NP   8 months ago Morbid obesity (HCC)   [place] [facility] [place], Melanie T, NP   1 year ago Morbid obesity (HCC)   [place] [facility] [place], Melanie T, NP   1 year ago Morbid obesity (HCC)   [facility] [place], Melanie T, NP   2 years ago Morbid obesity (HCC)   [facility] [place], Melanie DASEN, NP       Future Appointments             In 4 months Cann[place], [name] T, NP [place] [facility], [facility]

## 2024-02-06 ENCOUNTER — Encounter: Payer: Self-pay | Admitting: Nurse Practitioner

## 2024-02-06 ENCOUNTER — Ambulatory Visit (INDEPENDENT_AMBULATORY_CARE_PROVIDER_SITE_OTHER): Payer: Self-pay | Admitting: Nurse Practitioner

## 2024-02-06 DIAGNOSIS — G894 Chronic pain syndrome: Secondary | ICD-10-CM

## 2024-02-06 DIAGNOSIS — T466X5A Adverse effect of antihyperlipidemic and antiarteriosclerotic drugs, initial encounter: Secondary | ICD-10-CM | POA: Diagnosis not present

## 2024-02-06 DIAGNOSIS — M791 Myalgia, unspecified site: Secondary | ICD-10-CM

## 2024-02-06 DIAGNOSIS — Z0000 Encounter for general adult medical examination without abnormal findings: Secondary | ICD-10-CM

## 2024-02-06 DIAGNOSIS — E559 Vitamin D deficiency, unspecified: Secondary | ICD-10-CM

## 2024-02-06 DIAGNOSIS — Z1211 Encounter for screening for malignant neoplasm of colon: Secondary | ICD-10-CM | POA: Diagnosis not present

## 2024-02-06 DIAGNOSIS — E782 Mixed hyperlipidemia: Secondary | ICD-10-CM

## 2024-02-06 DIAGNOSIS — I350 Nonrheumatic aortic (valve) stenosis: Secondary | ICD-10-CM | POA: Diagnosis not present

## 2024-02-06 DIAGNOSIS — R7303 Prediabetes: Secondary | ICD-10-CM

## 2024-02-06 DIAGNOSIS — R011 Cardiac murmur, unspecified: Secondary | ICD-10-CM

## 2024-02-06 DIAGNOSIS — I10 Essential (primary) hypertension: Secondary | ICD-10-CM

## 2024-02-06 LAB — MICROALBUMIN, URINE WAIVED
Creatinine, Urine Waived: 200 mg/dL (ref 10–300)
Microalb, Ur Waived: 10 mg/L (ref 0–19)
Microalb/Creat Ratio: <30 mg/g (ref <30)

## 2024-02-06 LAB — BAYER DCA HB A1C WAIVED: HB A1C (BAYER DCA - WAIVED): 5.8 % — ABNORMAL HIGH (ref 4.8–5.6)

## 2024-02-06 MED ORDER — EZETIMIBE 10 MG PO TABS: 10.0000 mg | ORAL_TABLET | Freq: Every day | ORAL | 4 refills | Status: AC

## 2024-02-06 MED ORDER — HYDROCHLOROTHIAZIDE 12.5 MG PO TABS: 12.5000 mg | ORAL_TABLET | Freq: Every day | ORAL | 4 refills | Status: AC

## 2024-02-06 MED ORDER — GABAPENTIN 300 MG PO CAPS: ORAL_CAPSULE | ORAL | 4 refills | Status: AC

## 2024-02-06 MED ORDER — MELOXICAM 15 MG PO TABS: 15.0000 mg | ORAL_TABLET | Freq: Every day | ORAL | 3 refills | Status: AC

## 2024-02-06 MED ORDER — ROSUVASTATIN CALCIUM 5 MG PO TABS: ORAL_TABLET | ORAL | 4 refills | Status: AC

## 2024-02-06 NOTE — Assessment & Plan Note (Signed)
 To lower back, follows with physiatry. Will maintain this collaboration.  Recent notes reviewed.

## 2024-02-06 NOTE — Assessment & Plan Note (Signed)
 A1c trend down to 5.8% today, she continues to be between 5.6 to 6.3% on review.  Will adjust plan of care and add medication as needed.  Continue diet focus at this time.  Urine ALB 02 Feb 2024, consider ACE or ARB in future.

## 2024-02-06 NOTE — Assessment & Plan Note (Signed)
Chronic, ongoing.  Continue daily supplement and recheck Vit D level today, adjust dosing as needed.  DEXA with normal findings. 

## 2024-02-06 NOTE — Assessment & Plan Note (Signed)
At this time is tolerating Crestor on 3 day a week schedule, adjust this as needed.   If symptoms present then consider weekly dosing or change to Repatha.  Lipid panel today. 

## 2024-02-06 NOTE — Patient Instructions (Signed)

## 2024-02-06 NOTE — Progress Notes (Signed)
 BP 102/66 (BP Location: Left Arm, Patient Position: Sitting, Cuff Size: Normal)   Pulse 64   Temp (!) 97.5 F (36.4 C) (Oral)   Ht 5' 3.6" (1.615 m)   Wt 207 lb 12.8 oz (94.3 kg)   LMP  (LMP Unknown)   SpO2 95%   BMI 36.12 kg/m    Subjective:    Patient ID: Kiara Parker, female    DOB: 18-Oct-1950, 73 y.o.   MRN: [id number]  HPI: Kiara Parker is a 73 y.o. female presenting on 02/06/2024 for comprehensive medical examination. Current medical complaints include:none  She currently lives with: husband, 74 years of marriage in August 2025 Menopausal Symptoms: no   Follows with neurosurgery & physical medicine for her lower back, last visit 05/23/23.  Completed PT on 07/16/23.  HYPERLIPIDEMIA Taking Crestor  5 MG three days a week + Zetia  daily.  Taking HCTZ 12.5 MG daily. Saw cardiology at one time. Has known mild aortic stenosis and mild tricuspid and mitral regurgitation.  Last visit was 09/01/2020 and echo 03/03/2019. Hyperlipidemia status: good compliance Satisfied with current treatment?  no Side effects:  no Medication compliance: good compliance Past cholesterol meds: Lipitor and Pravastatin  Supplements: none Aspirin:  yes The 10-year ASCVD risk score (Arnett DK, et al., 2019) is: 8.6%   Values used to calculate the score:     Age: 56 years     Sex: Female     Is Non-Hispanic African American: No     Diabetic: No     Tobacco smoker: No     Systolic Blood Pressure: 102 mmHg     Is BP treated: Yes     HDL Cholesterol: 53 mg/dL     Total Cholesterol: 159 mg/dL Chest pain:  no Coronary artery disease:  no Family history CAD:  no Family history early CAD:  no    Impaired Fasting Glucose History of low Vitamin D  on labs. Was treated with high dose Vit D, but now taking 1000 units daily. No recent falls or fractures. HbA1C:  Lab Results  Component Value Date   HGBA1C 5.9 (H) 08/08/2023  Duration of elevated blood sugar: years Polydipsia: no Polyuria: no Weight  change: no Visual disturbance: no Glucose Monitoring: no    Accucheck frequency: Not Checking    Fasting glucose:     Post prandial:  Diabetic Education: Not Completed Family history of diabetes: yes two uncles Type 1   Depression Screen done today and results listed below:     02/06/2024    8:15 AM 08/20/2023   11:35 AM 08/08/2023    8:57 AM 02/05/2023    1:21 PM 08/21/2022   10:56 AM  Depression screen PHQ 2/9  Decreased Interest 1 0 0 0 0  Down, Depressed, Hopeless 1 0 1 0 0  PHQ - 2 Score 2 0 1 0 0  Altered sleeping 1 0 1 2 1   Tired, decreased energy 2 1 1 2  0  Change in appetite 1 0 3 3 0  Feeling bad or failure about yourself  1 0 0 0 0  Trouble concentrating 1 0 1 0 0  Moving slowly or fidgety/restless 0 0 1 0 0  Suicidal thoughts 0 0 0 0 0  PHQ-9 Score 8 1 8 7 1   Difficult doing work/chores Somewhat difficult Not difficult at all Somewhat difficult Not difficult at all Not difficult at all      08/08/2023    8:57 AM 02/05/2023    1:21 PM  08/22/2022   10:46 AM 01/25/2022    9:12 AM  GAD 7 : Generalized Anxiety Score  Nervous, Anxious, on Edge 0 0 0 0  Control/stop worrying 0 0 0 0  Worry too much - different things 1 0 0 0  Trouble relaxing 1 0 0 0  Restless 0 0 0 0  Easily annoyed or irritable 1 0 0 0  Afraid - awful might happen 1 0 0 0  Total GAD 7 Score 4 0 0 0  Anxiety Difficulty Not difficult at all Not difficult at all Not difficult at all Not difficult at all      08/21/2022   10:43 AM 02/05/2023    1:22 PM 08/08/2023    8:57 AM 08/20/2023   11:39 AM 02/06/2024    8:15 AM  Fall Risk  Falls in the past year? 0 0 0 0 0  Was there an injury with Fall? 0 0 0 0 0  Fall Risk Category Calculator 0 0 0 0 0  Fall Risk Category (Retired) Low      (RETIRED) Patient Fall Risk Level Low fall risk      Patient at Risk for Falls Due to   No Fall Risks Impaired balance/gait;Impaired mobility No Fall Risks  Fall risk Follow up Falls evaluation completed;Education  provided;Falls prevention discussed Follow up appointment Falls evaluation completed Falls prevention discussed;Education provided;Falls evaluation completed Falls evaluation completed    Functional Status Survey: Is the patient deaf or have difficulty hearing?: No Does the patient have difficulty seeing, even when wearing glasses/contacts?: No Does the patient have difficulty concentrating, remembering, or making decisions?: No Does the patient have difficulty walking or climbing stairs?: No Does the patient have difficulty dressing or bathing?: No Does the patient have difficulty doing errands alone such as visiting a doctor's office or shopping?: No   Past Medical History:  Past Medical History:  Diagnosis Date   Chronic pain syndrome    Hyperlipidemia    Hypertension    Lumbago    Spinal stenosis     Surgical History:  Past Surgical History:  Procedure Laterality Date   COLONOSCOPY WITH PROPOFOL  N/A 11/27/2018   Procedure: COLONOSCOPY WITH PROPOFOL ;  Surgeon: Luke Salaam, MD;  Location[facility] ENDOSCOPY;  Service: Gastroenterology;  Laterality: N/A;   TUBAL LIGATION      Medications:  Current Outpatient Medications on File Prior to Visit  Medication Sig   Ascorbic Acid (VITAMIN C) 1000 MG tablet Take 1,000 mg by mouth daily.   baclofen  (LIORESAL ) 10 MG tablet TAKE 0.5 TABLETS BY MOUTH AT BEDTIME.   Cholecalciferol  25 MCG (1000 UT) capsule Take 2,000 Units by mouth daily.   cyanocobalamin (VITAMIN B12) 1000 MCG tablet Take 1,000 mcg by mouth daily.   Multiple Vitamin (MULTIVITAMIN) tablet Take 1 tablet by mouth daily.   No current facility-administered medications on file prior to visit.    Allergies:  Allergies  Allergen Reactions   Atorvastatin  Other (See Comments)   Clavulanic Acid    Erythromycin    Penicillins Hives   Pravastatin  Other (See Comments)   Zithromax [Azithromycin] Hives    Social History:  Social History   Socioeconomic History   Marital  status: Married    Spouse name: Lovette Rud   Number of children: 1   Years of education: Not on file   Highest education level: Not on file  Occupational History   Occupation: retired  Tobacco Use   Smoking status: Never   Smokeless tobacco: Never  Vaping Use   Vaping status: Never Used  Substance and Sexual Activity   Alcohol use: No   Drug use: No   Sexual activity: Yes    Birth control/protection: None  Other Topics Concern   Not on file  Social History Narrative   Not on file   Social Drivers of Health   Financial Resource Strain: Low Risk  (08/20/2023)   Overall Financial Resource Strain (CARDIA)    Difficulty of Paying Living Expenses: Not hard at all  Food Insecurity: No Food Insecurity (08/20/2023)   Hunger Vital Sign    Worried About Running Out of Food in the Last Year: Never true    Ran Out of Food in the Last Year: Never true  Transportation Needs: No Transportation Needs (08/20/2023)   PRAPARE - Administrator, Civil Service (Medical): No    Lack of Transportation (Non-Medical): No  Physical Activity: Sufficiently Active (08/20/2023)   Exercise Vital Sign    Days of Exercise per Week: 7 days    Minutes of Exercise per Session: 60 min  Stress: No Stress Concern Present (08/20/2023)   [employer] of Occupational Health - Occupational Stress Questionnaire    Feeling of Stress : Only a little  Social Connections: Moderately Integrated (08/20/2023)   Social Connection and Isolation Panel [NHANES]    Frequency of Communication with Friends and Family: More than three times a week    Frequency of Social Gatherings with Friends and Family: More than three times a week    Attends Religious Services: More than 4 times per year    Active Member of [employer] or Organizations: No    Attends Banker Meetings: Never    Marital Status: Married  Catering manager Violence: Not At Risk (08/20/2023)   Humiliation, Afraid, Rape, and Kick questionnaire     Fear of Current or Ex-Partner: No    Emotionally Abused: No    Physically Abused: No    Sexually Abused: No   Social History   Tobacco Use  Smoking Status Never  Smokeless Tobacco Never   Social History   Substance and Sexual Activity  Alcohol Use No    Family History:  Family History  Problem Relation Age of Onset   Cancer Mother        breast   Arthritis Mother    Osteoporosis Mother    Breast cancer Mother 63   Heart disease Father    Lung disease Father    Cancer Sister        lymphoma   Multiple sclerosis Sister    Cancer Maternal Grandmother        lymphoma    Past medical history, surgical history, medications, allergies, family history and social history reviewed with patient today and changes made to appropriate areas of the chart.   ROS All other ROS negative except what is listed above and in the HPI.      Objective:    BP 102/66 (BP Location: Left Arm, Patient Position: Sitting, Cuff Size: Normal)   Pulse 64   Temp (!) 97.5 F (36.4 C) (Oral)   Ht 5' 3.6" (1.615 m)   Wt 207 lb 12.8 oz (94.3 kg)   LMP  (LMP Unknown)   SpO2 95%   BMI 36.12 kg/m   Wt Readings from Last 3 Encounters:  02/06/24 207 lb 12.8 oz (94.3 kg)  08/20/23 202 lb (91.6 kg)  08/08/23 209 lb 12.8 oz (95.2 kg)    Physical Exam  Vitals and nursing note reviewed.  Constitutional:      General: She is awake. She is not in acute distress.    Appearance: She is well-developed and well-groomed. She is obese. She is not ill-appearing or toxic-appearing.  HENT:     Head: Normocephalic and atraumatic.     Right Ear: Hearing, tympanic membrane, ear canal and external ear normal. No drainage.     Left Ear: Hearing, tympanic membrane, ear canal and external ear normal. No drainage.     Nose: Nose normal.     Right Sinus: No maxillary sinus tenderness or frontal sinus tenderness.     Left Sinus: No maxillary sinus tenderness or frontal sinus tenderness.     Mouth/Throat:      Mouth: Mucous membranes are moist.     Pharynx: Oropharynx is clear. Uvula midline. No pharyngeal swelling, oropharyngeal exudate or posterior oropharyngeal erythema.  Eyes:     General: Lids are normal.        Right eye: No discharge.        Left eye: No discharge.     Extraocular Movements: Extraocular movements intact.     Conjunctiva/sclera: Conjunctivae normal.     Pupils: Pupils are equal, round, and reactive to light.     Visual Fields: Right eye visual fields normal and left eye visual fields normal.  Neck:     Thyroid : No thyromegaly.     Vascular: No carotid bruit.     Trachea: Trachea normal.  Cardiovascular:     Rate and Rhythm: Normal rate and regular rhythm.     Heart sounds: Murmur heard.     Systolic murmur is present with a grade of 2/6.     No gallop.     Comments: Systolic murmur with radiation into carotids. Pulmonary:     Effort: Pulmonary effort is normal. No accessory muscle usage or respiratory distress.     Breath sounds: Normal breath sounds.  Chest:     Comments: Deferred per patient request. Abdominal:     General: Bowel sounds are normal.     Palpations: Abdomen is soft. There is no hepatomegaly or splenomegaly.     Tenderness: There is no abdominal tenderness.  Musculoskeletal:        General: Normal range of motion.     Cervical back: Normal range of motion and neck supple.     Right lower leg: No edema.     Left lower leg: No edema.  Lymphadenopathy:     Head:     Right side of head: No submental, submandibular, tonsillar, preauricular or posterior auricular adenopathy.     Left side of head: No submental, submandibular, tonsillar, preauricular or posterior auricular adenopathy.     Cervical: No cervical adenopathy.  Skin:    General: Skin is warm and dry.     Capillary Refill: Capillary refill takes less than 2 seconds.     Findings: No rash.  Neurological:     Mental Status: She is alert and oriented to person, place, and time.     Gait:  Gait is intact.     Deep Tendon Reflexes: Reflexes are normal and symmetric.     Reflex Scores:      Brachioradialis reflexes are 2+ on the right side and 2+ on the left side.      Patellar reflexes are 2+ on the right side and 2+ on the left side. Psychiatric:        Attention and Perception: Attention normal.  Mood and Affect: Mood normal.        Speech: Speech normal.        Behavior: Behavior normal. Behavior is cooperative.        Thought Content: Thought content normal.        Judgment: Judgment normal.    Results for orders placed or performed in visit on 08/08/23  Bayer DCA Hb A1c Waived   Collection Time: 08/08/23  8:57 AM  Result Value Ref Range   HB A1C (BAYER DCA - WAIVED) 5.9 (H) 4.8 - 5.6 %  Comprehensive metabolic panel   Collection Time: 08/08/23  8:57 AM  Result Value Ref Range   Glucose 99 70 - 99 mg/dL   BUN 14 8 - 27 mg/dL   Creatinine, Ser 2.95 0.57 - 1.00 mg/dL   eGFR 72 >62 ZH/YQM/5.78   BUN/Creatinine Ratio 16 12 - 28   Sodium 141 134 - 144 mmol/L   Potassium 3.9 3.5 - 5.2 mmol/L   Chloride 99 96 - 106 mmol/L   CO2 27 20 - 29 mmol/L   Calcium  9.9 8.7 - 10.3 mg/dL   Total Protein 6.7 6.0 - 8.5 g/dL   Albumin 4.3 3.9 - 4.9 g/dL   Globulin, Total 2.4 1.5 - 4.5 g/dL   Bilirubin Total 0.6 0.0 - 1.2 mg/dL   Alkaline Phosphatase 73 44 - 121 IU/L   AST 20 0 - 40 IU/L   ALT 21 0 - 32 IU/L  Lipid Panel w/o Chol/HDL Ratio   Collection Time: 08/08/23  8:57 AM  Result Value Ref Range   Cholesterol, Total 159 100 - 199 mg/dL   Triglycerides 469 0 - 149 mg/dL   HDL 53 >62 mg/dL   VLDL Cholesterol Cal 22 5 - 40 mg/dL   LDL Chol Calc (NIH) 84 0 - 99 mg/dL      Assessment & Plan:   Problem List Items Addressed This Visit       Cardiovascular and Mediastinum   Hypertension (Chronic)   Chronic, stable.  BP on lower side of normal, but stable and no symptoms with this. Continue low dose HCTZ and adjust as needed, discussed with her she could trial off  of it and if BP at home >130/80 restart. Recommend she monitor BP at least a few mornings a week at home and document.  DASH diet at home.  Labs today: CBC, TSH, urine ALB, CMP.  Urine ALB 10 in May 2025, may benefit from ACE or ARB in future.  Return in 6 months.       Relevant Medications   ezetimibe  (ZETIA ) 10 MG tablet   hydrochlorothiazide  (HYDRODIURIL ) 12.5 MG tablet   rosuvastatin  (CRESTOR ) 5 MG tablet   Other Relevant Orders   Microalbumin, Urine Waived   CBC with Differential/Platelet   Comprehensive metabolic panel with GFR   TSH   Mild aortic stenosis by prior echocardiogram   Systolic, no symptoms.  Monitor and obtain echo as needed.  Return to cardiology as needed.      Relevant Medications   ezetimibe  (ZETIA ) 10 MG tablet   hydrochlorothiazide  (HYDRODIURIL ) 12.5 MG tablet   rosuvastatin  (CRESTOR ) 5 MG tablet     Other   Vitamin D  deficiency (Chronic)   Chronic, ongoing.  Continue daily supplement and recheck Vit D level today, adjust dosing as needed.  DEXA with normal findings.      Relevant Orders   VITAMIN D  25 Hydroxy (Vit-D Deficiency, Fractures)   Prediabetes (Chronic)   A1c  trend down to 5.8% today, she continues to be between 5.6 to 6.3% on review.  Will adjust plan of care and add medication as needed.  Continue diet focus at this time.  Urine ALB 02 Feb 2024, consider ACE or ARB in future.      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   If symptoms present then consider weekly dosing or change to Repatha.  Lipid panel today.      Morbid obesity (HCC) - Primary (Chronic)   BMI 36.12 with HTN and Prediabetes.  Recommended eating smaller high protein, low fat meals more frequently and exercising 30 mins a day 5 times a week with a goal of 10-15lb weight loss in the next 3 months. Patient voiced their  understanding and motivation to adhere to these recommendations.       Hyperlipidemia (Chronic)   Chronic, ongoing.  Is tolerating Crestor  on 3 day week schedule + Zetia  daily.  Will continue this and adjust as needed.  Obtain lipid panel today.      Relevant Medications   ezetimibe  (ZETIA ) 10 MG tablet   hydrochlorothiazide  (HYDRODIURIL ) 12.5 MG tablet   rosuvastatin  (CRESTOR ) 5 MG tablet   Other Relevant Orders   Comprehensive metabolic panel with GFR   Lipid Panel w/o Chol/HDL Ratio   Chronic pain syndrome (Chronic)   To lower back, follows with physiatry. Will maintain this collaboration.  Recent notes reviewed.      Relevant Medications   gabapentin  (NEURONTIN ) 300 MG capsule   meloxicam  (MOBIC ) 15 MG tablet   Other Visit Diagnoses       Colon cancer screening       GI referral placed   Relevant Orders   Ambulatory referral to Gastroenterology     Encounter for annual physical exam       Annual physical today with labs and health maintenance reviewed, discussed with patient.        Follow up plan: Return in about 6 months (around 08/08/2024) for HTN/HLD, PREDIABETES.   LABORATORY TESTING:  - Pap smear: not applicable  IMMUNIZATIONS:   - Tdap: Tetanus vaccination status reviewed: last tetanus booster within 10 years - Influenza: Up to date - Pneumovax: Up to date - Prevnar: Up to date - HPV: Not applicable - Zostavax vaccine: Will obtain at drugstore, educated on this - Covid: Has had about 6 or 7 of these  SCREENING: -Mammogram: Up to date -- next 04/22/2024 - Colonoscopy: Up to date  - Bone Density: Up to date due next 10/08/2029 -Hearing Test: Not applicable  -Spirometry: Not applicable   PATIENT COUNSELING:   Advised to take 1 mg of folate supplement per day if capable of pregnancy.   Sexuality: Discussed sexually transmitted diseases, partner selection, use of condoms, avoidance of unintended pregnancy  and contraceptive alternatives.   Advised to  avoid cigarette smoking.  I discussed with the patient that most people either abstain from alcohol or drink within safe limits (<=14/week and <=4 drinks/occasion for males, <=7/weeks and <= 3 drinks/occasion for females) and that the risk for alcohol disorders and other health effects rises proportionally with the number of drinks per week and how often a drinker exceeds daily limits.  Discussed cessation/primary prevention of drug use and availability of treatment for abuse.   Diet: Encouraged to adjust  caloric intake to maintain  or achieve ideal body weight, to reduce intake of dietary saturated fat and total fat, to limit sodium intake by avoiding high sodium foods and not adding table salt, and to maintain adequate dietary potassium and calcium  preferably from fresh fruits, vegetables, and low-fat dairy products.    Stressed the importance of regular exercise  Injury prevention: Discussed safety belts, safety helmets, smoke detector, smoking near bedding or upholstery.   Dental health: Discussed importance of regular tooth brushing, flossing, and dental visits.    NEXT PREVENTATIVE PHYSICAL DUE IN 1 YEAR. Return in about 6 months (around 08/08/2024) for HTN/HLD, PREDIABETES.

## 2024-02-06 NOTE — Assessment & Plan Note (Signed)
Chronic, ongoing.  Is tolerating Crestor on 3 day week schedule + Zetia daily.  Will continue this and adjust as needed.  Obtain lipid panel today.

## 2024-02-06 NOTE — Assessment & Plan Note (Signed)
 BMI 36.12 with HTN and Prediabetes.  Recommended eating smaller high protein, low fat meals more frequently and exercising 30 mins a day 5 times a week with a goal of 10-15lb weight loss in the next 3 months. Patient voiced their understanding and motivation to adhere to these recommendations.

## 2024-02-06 NOTE — Assessment & Plan Note (Signed)
 Systolic, no symptoms.  Monitor and obtain echo as needed.  Return to cardiology as needed.

## 2024-02-06 NOTE — Assessment & Plan Note (Signed)
 Chronic, stable.  BP on lower side of normal, but stable and no symptoms with this. Continue low dose HCTZ and adjust as needed, discussed with her she could trial off of it and if BP at home >130/80 restart. Recommend she monitor BP at least a few mornings a week at home and document.  DASH diet at home.  Labs today: CBC, TSH, urine ALB, CMP.  Urine ALB 10 in May 2025, may benefit from ACE or ARB in future.  Return in 6 months.

## 2024-02-07 ENCOUNTER — Ambulatory Visit: Payer: Self-pay | Admitting: Nurse Practitioner

## 2024-02-07 LAB — CBC WITH DIFFERENTIAL/PLATELET
Basophils Absolute: 0 10*3/uL (ref 0.0–0.2)
Basos: 1 %
EOS (ABSOLUTE): 0.3 10*3/uL (ref 0.0–0.4)
Eos: 5 %
Hematocrit: 43.7 % (ref 34.0–46.6)
Hemoglobin: 13.9 g/dL (ref 11.1–15.9)
Immature Grans (Abs): 0 10*3/uL (ref 0.0–0.1)
Immature Granulocytes: 0 %
Lymphocytes Absolute: 2.1 10*3/uL (ref 0.7–3.1)
Lymphs: 36 %
MCH: 29.1 pg (ref 26.6–33.0)
MCHC: 31.8 g/dL (ref 31.5–35.7)
MCV: 91 fL (ref 79–97)
Monocytes Absolute: 0.5 10*3/uL (ref 0.1–0.9)
Monocytes: 8 %
Neutrophils Absolute: 3 10*3/uL (ref 1.4–7.0)
Neutrophils: 50 %
Platelets: 270 10*3/uL (ref 150–450)
RBC: 4.78 x10E6/uL (ref 3.77–5.28)
RDW: 13.2 % (ref 11.7–15.4)
WBC: 5.8 10*3/uL (ref 3.4–10.8)

## 2024-02-07 LAB — COMPREHENSIVE METABOLIC PANEL WITH GFR
ALT: 19 IU/L (ref 0–32)
AST: 19 IU/L (ref 0–40)
Albumin: 4.3 g/dL (ref 3.8–4.8)
Alkaline Phosphatase: 71 IU/L (ref 44–121)
BUN/Creatinine Ratio: 22 (ref 12–28)
BUN: 17 mg/dL (ref 8–27)
Bilirubin Total: 0.4 mg/dL (ref 0.0–1.2)
CO2: 23 mmol/L (ref 20–29)
Calcium: 9.4 mg/dL (ref 8.7–10.3)
Chloride: 100 mmol/L (ref 96–106)
Creatinine, Ser: 0.76 mg/dL (ref 0.57–1.00)
Globulin, Total: 2 g/dL (ref 1.5–4.5)
Glucose: 93 mg/dL (ref 70–99)
Potassium: 4 mmol/L (ref 3.5–5.2)
Sodium: 141 mmol/L (ref 134–144)
Total Protein: 6.3 g/dL (ref 6.0–8.5)
eGFR: 84 mL/min/{1.73_m2} (ref >59)

## 2024-02-07 LAB — LIPID PANEL W/O CHOL/HDL RATIO
Cholesterol, Total: 159 mg/dL (ref 100–199)
HDL: 49 mg/dL (ref >39)
LDL Chol Calc (NIH): 88 mg/dL (ref 0–99)
Triglycerides: 122 mg/dL (ref 0–149)
VLDL Cholesterol Cal: 22 mg/dL (ref 5–40)

## 2024-02-07 LAB — TSH: TSH: 1.99 u[IU]/mL (ref 0.450–4.500)

## 2024-02-07 LAB — VITAMIN D 25 HYDROXY (VIT D DEFICIENCY, FRACTURES): Vit D, 25-Hydroxy: 36.6 ng/mL (ref 30.0–100.0)

## 2024-02-07 NOTE — Progress Notes (Signed)
 Contacted via MyChart   Good morning Kiara Parker, your labs have returned and are overall stable.  No medication changes needed.  Any questions? Keep being amazing!!  Thank you for allowing me to participate in your care.  I appreciate you. Kindest regards, [place]

## 2024-04-06 ENCOUNTER — Other Ambulatory Visit: Payer: Self-pay | Admitting: Nurse Practitioner

## 2024-04-08 NOTE — Telephone Encounter (Signed)
 Too soon for refill.  Requested Prescriptions  Pending Prescriptions Disp Refills   rosuvastatin  (CRESTOR ) 5 MG tablet [Pharmacy Med Name: ROSUVASTATIN  CALCIUM  5 MG TAB] 36 tablet 4    Sig: TAKE 1 TABLET BY MOUTH 3 TIMES A WEEK.     Cardiovascular:  Antilipid - Statins 2 Failed - 04/08/2024 11:02 AM      Failed - Lipid Panel in normal range within the last 12 months    Cholesterol, Total  Date Value Ref Range Status  02/06/2024 159 100 - 199 mg/dL Final   LDL Chol Calc (NIH)  Date Value Ref Range Status  02/06/2024 88 0 - 99 mg/dL Final   HDL  Date Value Ref Range Status  02/06/2024 49 >39 mg/dL Final   Triglycerides  Date Value Ref Range Status  02/06/2024 122 0 - 149 mg/dL Final         Passed - Cr in normal range and within 360 days    Creatinine, Ser  Date Value Ref Range Status  02/06/2024 0.76 0.57 - 1.00 mg/dL Final         Passed - Patient is not pregnant      Passed - Valid encounter within last 12 months    Recent Outpatient Visits           2 months ago Morbid obesity (HCC)   [place] [place], Melanie DASEN, NP

## 2024-04-13 ENCOUNTER — Other Ambulatory Visit: Payer: Self-pay | Admitting: Nurse Practitioner

## 2024-04-15 NOTE — Telephone Encounter (Signed)
 Requested Prescriptions  Pending Prescriptions Disp Refills   baclofen  (LIORESAL ) 10 MG tablet [Pharmacy Med Name: BACLOFEN  10 MG TABLET] 45 tablet 1    Sig: TAKE 1/2 TABLET BY MOUTH AT BEDTIME     Analgesics:  Muscle Relaxants - baclofen  Passed - 04/15/2024  1:37 PM      Passed - Cr in normal range and within 180 days    Creatinine, Ser  Date Value Ref Range Status  02/06/2024 0.76 0.57 - 1.00 mg/dL Final         Passed - eGFR is 30 or above and within 180 days    GFR calc Af Amer  Date Value Ref Range Status  10/29/2020 71 >59 mL/min/1.73 Final    Comment:    **In accordance with recommendations from the NKF-ASN Task force,**   [facility] is in the process of updating its eGFR calculation to the   2021 CKD-EPI creatinine equation that estimates kidney function   without a race variable.    GFR calc non Af Amer  Date Value Ref Range Status  10/29/2020 62 >59 mL/min/1.73 Final   eGFR  Date Value Ref Range Status  02/06/2024 84 >59 mL/min/1.73 Final         Passed - Valid encounter within last 6 months    Recent Outpatient Visits           2 months ago Morbid obesity [facility])   [place] [facility] [place], Melanie DASEN, NP

## 2024-06-20 DIAGNOSIS — Z09 Encounter for follow-up examination after completed treatment for conditions other than malignant neoplasm: Secondary | ICD-10-CM | POA: Diagnosis not present

## 2024-06-20 DIAGNOSIS — Z860101 Personal history of adenomatous and serrated colon polyps: Secondary | ICD-10-CM | POA: Diagnosis not present

## 2024-07-04 ENCOUNTER — Ambulatory Visit: Admitting: Anesthesiology

## 2024-07-04 ENCOUNTER — Ambulatory Visit
Admission: RE | Admit: 2024-07-04 | Discharge: 2024-07-04 | Disposition: A | Discharge status: Home / Self Care | Attending: Gastroenterology | Admitting: Gastroenterology

## 2024-07-04 ENCOUNTER — Other Ambulatory Visit: Payer: Self-pay

## 2024-07-04 ENCOUNTER — Encounter: Admission: RE | Disposition: A | Payer: Self-pay | Attending: Gastroenterology

## 2024-07-04 ENCOUNTER — Encounter: Payer: Self-pay | Admitting: Gastroenterology

## 2024-07-04 DIAGNOSIS — Z860101 Personal history of adenomatous and serrated colon polyps: Secondary | ICD-10-CM | POA: Diagnosis not present

## 2024-07-04 DIAGNOSIS — I10 Essential (primary) hypertension: Secondary | ICD-10-CM | POA: Insufficient documentation

## 2024-07-04 DIAGNOSIS — K5730 Diverticulosis of large intestine without perforation or abscess without bleeding: Secondary | ICD-10-CM | POA: Diagnosis not present

## 2024-07-04 DIAGNOSIS — Z1211 Encounter for screening for malignant neoplasm of colon: Secondary | ICD-10-CM | POA: Diagnosis not present

## 2024-07-04 DIAGNOSIS — E785 Hyperlipidemia, unspecified: Secondary | ICD-10-CM | POA: Diagnosis not present

## 2024-07-04 DIAGNOSIS — K635 Polyp of colon: Secondary | ICD-10-CM | POA: Diagnosis not present

## 2024-07-04 DIAGNOSIS — Z860100 Personal history of colon polyps, unspecified: Secondary | ICD-10-CM | POA: Diagnosis not present

## 2024-07-04 HISTORY — PX: COLONOSCOPY: SHX5424

## 2024-07-04 HISTORY — PX: POLYPECTOMY: SHX149

## 2024-07-04 SURGERY — COLONOSCOPY
Anesthesia: General

## 2024-07-04 MED ORDER — PROPOFOL 500 MG/50ML IV EMUL
INTRAVENOUS | Status: DC | PRN
  Administered 2024-07-04: 75 ug/kg/min via INTRAVENOUS

## 2024-07-04 MED ORDER — PROPOFOL 10 MG/ML IV BOLUS
INTRAVENOUS | Status: DC | PRN
  Administered 2024-07-04: 20 mg via INTRAVENOUS
  Administered 2024-07-04: 30 mg via INTRAVENOUS
  Administered 2024-07-04: 50 mg via INTRAVENOUS

## 2024-07-04 MED ORDER — LIDOCAINE HCL (CARDIAC) PF 100 MG/5ML IV SOSY
PREFILLED_SYRINGE | INTRAVENOUS | Status: DC | PRN
  Administered 2024-07-04: 80 mg via INTRAVENOUS

## 2024-07-04 MED ORDER — SODIUM CHLORIDE 0.9 % IV SOLN: INTRAVENOUS | Status: DC

## 2024-07-04 MED ORDER — LIDOCAINE HCL (PF) 2 % IJ SOLN
INTRAMUSCULAR | Status: AC
Start: 2024-07-04 — End: 2024-07-04
  Filled 2024-07-04: qty 5

## 2024-07-04 MED ORDER — DEXMEDETOMIDINE HCL IN NACL 80 MCG/20ML IV SOLN
INTRAVENOUS | Status: DC | PRN
  Administered 2024-07-04: 12 ug via INTRAVENOUS
  Administered 2024-07-04: 8 ug via INTRAVENOUS

## 2024-07-04 NOTE — H&P (Signed)
 Ruel Kung , MD [address], [address], [place], [place], [address] Phone: [phone] Fax: [phone]  Primary Care Physician:  Valerio Melanie DASEN, NP   Pre-Procedure History & Physical: HPI:  Kiara Parker is a 73 y.o. female is here for an colonoscopy.   Past Medical History:  Diagnosis Date   Chronic pain syndrome    Hyperlipidemia    Hypertension    Lumbago    Spinal stenosis     Past Surgical History:  Procedure Laterality Date   COLONOSCOPY WITH PROPOFOL  N/A 11/27/2018   Procedure: COLONOSCOPY WITH PROPOFOL ;  Surgeon: Kung Ruel, MD;  Location: [facility] ENDOSCOPY;  Service: Gastroenterology;  Laterality: N/A;   TUBAL LIGATION      Prior to Admission medications   Medication Sig Start Date End Date Taking? Authorizing Provider  Ascorbic Acid (VITAMIN C) 1000 MG tablet Take 1,000 mg by mouth daily.   Yes [provider]  Cholecalciferol  25 MCG (1000 UT) capsule Take 2,000 Units by mouth daily.   Yes [provider]  cyanocobalamin (VITAMIN B12) 1000 MCG tablet Take 1,000 mcg by mouth daily.   Yes [provider]  ezetimibe  (ZETIA ) 10 MG tablet Take 1 tablet (10 mg total) by mouth daily. 02/06/24  Yes Cannady, [name] T, NP  gabapentin  (NEURONTIN ) 300 MG capsule TAKE 1 CAPSULE BY MOUTH THREE TIMES A DAY 02/06/24  Yes Cannady, [name] T, NP  hydrochlorothiazide  (HYDRODIURIL ) 12.5 MG tablet Take 1 tablet (12.5 mg total) by mouth daily. 02/06/24  Yes Cannady, [name] T, NP  meloxicam  (MOBIC ) 15 MG tablet Take 1 tablet (15 mg total) by mouth daily. 02/06/24  Yes Cannady, [name] T, NP  Multiple Vitamin (MULTIVITAMIN) tablet Take 1 tablet by mouth daily.   Yes [provider]  rosuvastatin  (CRESTOR ) 5 MG tablet TAKE 1 TABLET (5 MG TOTAL) BY MOUTH 3 (THREE) TIMES A WEEK 02/06/24  Yes Cannady, [name] T, NP  baclofen  (LIORESAL ) 10 MG tablet TAKE 1/2 TABLET BY MOUTH AT BEDTIME 04/15/24   Cannady, [name] T, NP    Allergies as of 07/01/2024 -  Review Complete 02/06/2024  Allergen Reaction Noted   Atorvastatin  Other (See Comments) 12/16/2018   Clavulanic acid  12/13/2020   Erythromycin  08/30/2016   Penicillins Hives 08/30/2016   Pravastatin  Other (See Comments) 12/16/2018   Zithromax [azithromycin] Hives 08/30/2016    Family History  Problem Relation Age of Onset   Cancer Mother        breast   Arthritis Mother    Osteoporosis Mother    Breast cancer Mother 15   Heart disease Father    Lung disease Father    Cancer Sister        lymphoma   Multiple sclerosis Sister    Cancer Maternal Grandmother        lymphoma    Social History   Socioeconomic History   Marital status: Married    Spouse name: Sandra   Number of children: 1   Years of education: Not on file   Highest education level: Not on file  Occupational History   Occupation: retired  Tobacco Use   Smoking status: Never   Smokeless tobacco: Never  Vaping Use   Vaping status: Never Used  Substance and Sexual Activity   Alcohol use: No   Drug use: No   Sexual activity: Yes    Birth control/protection: None  Other Topics Concern   Not on file  Social History Narrative   Not on file  Social Drivers of Corporate investment banker Strain: Low Risk  (06/20/2024)   Received from [facility]   Overall Financial Resource Strain (CARDIA)    Difficulty of Paying Living Expenses: Not very hard  Food Insecurity: Food Insecurity Present (06/20/2024)   Received from [facility]   Hunger Vital Sign    Within the past 12 months, you worried that your food would run out before you got the money to buy more.: Sometimes true    Within the past 12 months, the food you bought just didn't last and you didn't have money to get more.: Never true  Transportation Needs: No Transportation Needs (06/20/2024)   Received from [facility]    In the past 12 months, has lack of transportation  kept you from medical appointments or from getting medications?: No    Lack of Transportation (Non-Medical): No  Physical Activity: Sufficiently Active (08/20/2023)   Exercise Vital Sign    Days of Exercise per Week: 7 days    Minutes of Exercise per Session: 60 min  Stress: No Stress Concern Present (08/20/2023)   [employer] of Occupational Health - Occupational Stress Questionnaire    Feeling of Stress : Only a little  Social Connections: Moderately Integrated (08/20/2023)   Social Connection and Isolation Panel    Frequency of Communication with Friends and Family: More than three times a week    Frequency of Social Gatherings with Friends and Family: More than three times a week    Attends Religious Services: More than 4 times per year    Active Member of [employer] or Organizations: No    Attends Banker Meetings: Never    Marital Status: Married  Catering manager Violence: Not At Risk (08/20/2023)   Humiliation, Afraid, Rape, and Kick questionnaire    Fear of Current or Ex-Partner: No    Emotionally Abused: No    Physically Abused: No    Sexually Abused: No    Review of Systems: See HPI, otherwise negative ROS  Physical Exam: BP (!) 144/77   Pulse (!) 59   Temp (!) 96.4 F (35.8 C) (Temporal)   Resp 16   Ht 5' 4 (1.626 m)   Wt 94.4 kg   LMP  (LMP Unknown)   SpO2 94%   BMI 35.74 kg/m  General:   Alert,  pleasant and cooperative in NAD Head:  Normocephalic and atraumatic. Neck:  Supple; no masses or thyromegaly. Lungs:  Clear throughout to auscultation, normal respiratory effort.    Heart:  +S1, +S2, Regular rate and rhythm, No edema. Abdomen:  Soft, nontender and nondistended. Normal bowel sounds, without guarding, and without rebound.   Neurologic:  Alert and  oriented x4;  grossly normal neurologically.  Impression/Plan: Kiara Parker is here for an colonoscopy to be performed for surveillance due to prior history of colon polyps   Risks,  benefits, limitations, and alternatives regarding  colonoscopy have been reviewed with the patient.  Questions have been answered.  All parties agreeable.   Ruel Kung, MD  07/04/2024, 10:41 AM

## 2024-07-04 NOTE — Transfer of Care (Signed)
 Immediate Anesthesia Transfer of Care Note  Patient: Kiara Parker  Procedure(s) Performed: COLONOSCOPY POLYPECTOMY, INTESTINE  Patient Location: PACU  Anesthesia Type:General  Level of Consciousness: sedated  Airway & Oxygen Therapy: Patient Spontanous Breathing  Post-op Assessment: Report given to RN and Post -op Vital signs reviewed and stable  Post vital signs: Reviewed and stable  Last Vitals:  Vitals Value Taken Time  BP 94/56 07/04/24 11:41  Temp    Pulse 56 07/04/24 11:41  Resp 15 07/04/24 11:41  SpO2 97 % 07/04/24 11:41  Vitals shown include unfiled device data.  Last Pain:  Vitals:   07/04/24 1141  TempSrc:   PainSc: Asleep         Complications: No notable events documented.

## 2024-07-04 NOTE — Anesthesia Preprocedure Evaluation (Addendum)
 Anesthesia Evaluation  Patient identified by MRN, date of birth, ID band Patient awake    Reviewed: Allergy & Precautions, H&P , NPO status , Patient's Chart, lab work & pertinent test results  Airway Mallampati: II  TM Distance: >3 FB Neck ROM: full    Dental no notable dental hx.    Pulmonary neg pulmonary ROS   Pulmonary exam normal        Cardiovascular hypertension, Normal cardiovascular exam+ Valvular Problems/Murmurs   Mild aortic stenosis by prior echocardiogram   Neuro/Psych  Neuromuscular disease  negative psych ROS   GI/Hepatic negative GI ROS, Neg liver ROS,,,  Endo/Other  negative endocrine ROS    Renal/GU negative Renal ROS  negative genitourinary   Musculoskeletal   Abdominal  (+) + obese  Peds  Hematology negative hematology ROS (+)   Anesthesia Other Findings Past Medical History: No date: Chronic pain syndrome No date: Hyperlipidemia No date: Hypertension No date: Lumbago No date: Spinal stenosis  Past Surgical History: 11/27/2018: COLONOSCOPY WITH PROPOFOL ; N/A     Comment:  Procedure: COLONOSCOPY WITH PROPOFOL ;  Surgeon: Therisa Bi, MD;  Location: [facility] ENDOSCOPY;  Service:               Gastroenterology;  Laterality: N/A; No date: TUBAL LIGATION     Reproductive/Obstetrics negative OB ROS                              Anesthesia Physical Anesthesia Plan  ASA: 3  Anesthesia Plan: General   Post-op Pain Management: Minimal or no pain anticipated   Induction: Intravenous  PONV Risk Score and Plan: Propofol  infusion and TIVA  Airway Management Planned: Natural Airway and Nasal Cannula  Additional Equipment:   Intra-op Plan:   Post-operative Plan:   Informed Consent: I have reviewed the patients History and Physical, chart, labs and discussed the procedure including the risks, benefits and alternatives for the proposed anesthesia with  the patient or authorized representative who has indicated his/her understanding and acceptance.     Dental Advisory Given  Plan Discussed with: CRNA and Surgeon  Anesthesia Plan Comments:          Anesthesia Quick Evaluation

## 2024-07-04 NOTE — Anesthesia Postprocedure Evaluation (Signed)
 Anesthesia Post Note  Patient: Kiara Parker  Procedure(s) Performed: COLONOSCOPY POLYPECTOMY, INTESTINE  Patient location during evaluation: Endoscopy Anesthesia Type: General Level of consciousness: awake and alert Pain management: pain level controlled Vital Signs Assessment: post-procedure vital signs reviewed and stable Respiratory status: spontaneous breathing, nonlabored ventilation and respiratory function stable Cardiovascular status: blood pressure returned to baseline and stable Postop Assessment: no apparent nausea or vomiting Anesthetic complications: no   No notable events documented.   Last Vitals:  Vitals:   07/04/24 1141 07/04/24 1151  BP: (!) 94/56 98/71  Pulse: 94 (!) 56  Resp: 15 17  Temp:    SpO2: 97% 94%    Last Pain:  Vitals:   07/04/24 1151  TempSrc:   PainSc: 0-No pain                 Camellia Merilee Louder

## 2024-07-04 NOTE — Op Note (Signed)
 [facility] Gastroenterology Patient Name: Kiara Parker Procedure Date: 07/04/2024 11:13 AM MRN: [id number] Account #: [id number] Date of Birth: 1951/06/23 Admit Type: Outpatient Age: 73 Room: [facility] ENDO ROOM 4 Gender: Female Note Status: Finalized Instrument Name: Colon Scope [id number] Procedure:             Colonoscopy Indications:           Surveillance: Personal history of adenomatous polyps                         on last colonoscopy > 3 years ago, Last colonoscopy:                         March 2020 Providers:             Ruel Kung MD, MD Referring MD:          Michelene Cower (Referring MD) Medicines:             Monitored Anesthesia Care Complications:         No immediate complications. Procedure:             Pre-Anesthesia Assessment:                        - Prior to the procedure, a History and Physical was                         performed, and patient medications, allergies and                         sensitivities were reviewed. The patient's tolerance                         of previous anesthesia was reviewed.                        - The risks and benefits of the procedure and the                         sedation options and risks were discussed with the                         patient. All questions were answered and informed                         consent was obtained.                        - ASA Grade Assessment: II - A patient with mild                         systemic disease.                        After obtaining informed consent, the colonoscope was                         passed under direct vision. Throughout the procedure,                         the patient's blood  pressure, pulse, and oxygen                         saturations were monitored continuously. The was                         introduced through the anus and advanced to the the                         cecum, identified by the appendiceal orifice. The                          colonoscopy was performed with ease. The patient                         tolerated the procedure well. The quality of the bowel                         preparation was good. The ileocecal valve, appendiceal                         orifice, and rectum were photographed. Findings:      The perianal and digital rectal examinations were normal.      A 3 mm polyp was found in the ascending colon. The polyp was sessile.       The polyp was removed with a jumbo cold forceps. Resection and retrieval       were complete.      Multiple medium-mouthed diverticula were found in the sigmoid colon.      The exam was otherwise without abnormality on direct and retroflexion       views. Impression:            - One 3 mm polyp in the ascending colon, removed with                         a jumbo cold forceps. Resected and retrieved.                        - Diverticulosis in the sigmoid colon.                        - The examination was otherwise normal on direct and                         retroflexion views. Recommendation:        - Discharge patient to home (with escort).                        - Resume previous diet.                        - Continue present medications.                        - Await pathology results.                        - Repeat colonoscopy is not recommended due to current  age (35 years or older) for surveillance. Procedure Code(s):     --- Professional ---                        [phone], Colonoscopy, flexible; with biopsy, single or                         multiple Diagnosis Code(s):     --- Professional ---                        Z86.010, Personal history of colonic polyps                        D12.2, Benign neoplasm of ascending colon                        K57.30, Diverticulosis of large intestine without                         perforation or abscess without bleeding CPT copyright 2022 American Medical Association. All rights reserved. The  codes documented in this report are preliminary and upon coder review may  be revised to meet current compliance requirements. Ruel Kung, MD Ruel Kung MD, MD 07/04/2024 11:39:11 AM This report has been signed electronically. Number of Addenda: 0 Note Initiated On: 07/04/2024 11:13 AM Scope Withdrawal Time: 0 hours 10 minutes 53 seconds  Total Procedure Duration: 0 hours 13 minutes 3 seconds  Estimated Blood Loss:  Estimated blood loss: none.      [facility]

## 2024-07-10 LAB — SURGICAL PATHOLOGY

## 2024-08-09 NOTE — Patient Instructions (Incomplete)
 Please call to schedule your mammogram and/or bone density: [facility]  Address: [address], [place], [place] [address] Phone: [phone]  [facility] [address] [place],  [place]  [address] Phone: [phone]    Be Involved in Caring For Your Health:  Taking Medications When medications are taken as directed, they can greatly improve your health. But if they are not taken as prescribed, they may not work. In some cases, not taking them correctly can be harmful. To help ensure your treatment remains effective and safe, understand your medications and how to take them. Bring your medications to each visit for review by your provider.  Your lab results, notes, and after visit summary will be available on My Chart. We strongly encourage you to use this feature. If lab results are abnormal the clinic will contact you with the appropriate steps. If the clinic does not contact you assume the results are satisfactory. You can always view your results on My Chart. If you have questions regarding your health or results, please contact the clinic during office hours. You can also ask questions on My Chart.  We at [facility] are grateful that you chose us  to provide your care. We strive to provide evidence-based and compassionate care and are always looking for feedback. If you get a survey from the clinic please complete this so we can hear your opinions.   Heart-Healthy Eating Plan Many factors influence your heart health, including eating and exercise habits. Heart health is also called coronary health. Coronary risk increases with abnormal blood fat (lipid) levels. A heart-healthy eating plan includes limiting unhealthy fats, increasing healthy fats, limiting salt (sodium) intake, and making other diet and lifestyle changes. What is my plan? Your health care provider may recommend that: You limit  your fat intake to _________% or less of your total calories each day. You limit your saturated fat intake to _________% or less of your total calories each day. You limit the amount of cholesterol in your diet to less than _________ mg per day. You limit the amount of sodium in your diet to less than _________ mg per day. What are tips for following this plan? Cooking Cook foods using methods other than frying. Baking, boiling, grilling, and broiling are all good options. Other ways to reduce fat include: Removing the skin from poultry. Removing all visible fats from meats. Steaming vegetables in water or broth. Meal planning  At meals, imagine dividing your plate into fourths: Fill one-half of your plate with vegetables and green salads. Fill one-fourth of your plate with whole grains. Fill one-fourth of your plate with lean protein foods. Eat 2-4 cups of vegetables per day. One cup of vegetables equals 1 cup (91 g) broccoli or cauliflower florets, 2 medium carrots, 1 large bell pepper, 1 large sweet potato, 1 large tomato, 1 medium white potato, 2 cups (150 g) raw leafy greens. Eat 1-2 cups of fruit per day. One cup of fruit equals 1 small apple, 1 large banana, 1 cup (237 g) mixed fruit, 1 large orange,  cup (82 g) dried fruit, 1 cup (240 mL) 100% fruit juice. Eat more foods that contain soluble fiber. Examples include apples, broccoli, carrots, beans, peas, and barley. Aim to get 25-30 g of fiber per day. Increase your consumption of legumes, nuts, and seeds to 4-5 servings per week. One serving of dried beans or legumes equals  cup (90  g) cooked, 1 serving of nuts is  oz (12 almonds, 24 pistachios, or 7 walnut halves), and 1 serving of seeds equals  oz (8 g). Fats Choose healthy fats more often. Choose monounsaturated and polyunsaturated fats, such as olive and canola oils, avocado oil, flaxseeds, walnuts, almonds, and seeds. Eat more omega-3 fats. Choose salmon, mackerel,  sardines, tuna, flaxseed oil, and ground flaxseeds. Aim to eat fish at least 2 times each week. Check food labels carefully to identify foods with trans fats or high amounts of saturated fat. Limit saturated fats. These are found in animal products, such as meats, butter, and cream. Plant sources of saturated fats include palm oil, palm kernel oil, and coconut oil. Avoid foods with partially hydrogenated oils in them. These contain trans fats. Examples are stick margarine, some tub margarines, cookies, crackers, and other baked goods. Avoid fried foods. General information Eat more home-cooked food and less restaurant, buffet, and fast food. Limit or avoid alcohol. Limit foods that are high in added sugar and simple starches such as foods made using white refined flour (white breads, pastries, sweets). Lose weight if you are overweight. Losing just 5-10% of your body weight can help your overall health and prevent diseases such as diabetes and heart disease. Monitor your sodium intake, especially if you have high blood pressure. Talk with your health care provider about your sodium intake. Try to incorporate more vegetarian meals weekly. What foods should I eat? Fruits All fresh, canned (in natural juice), or frozen fruits. Vegetables Fresh or frozen vegetables (raw, steamed, roasted, or grilled). Green salads. Grains Most grains. Choose whole wheat and whole grains most of the time. Rice and pasta, including brown rice and pastas made with whole wheat. Meats and other proteins Lean, well-trimmed beef, veal, pork, and lamb. Chicken and turkey without skin. All fish and shellfish. Wild duck, rabbit, pheasant, and venison. Egg whites or low-cholesterol egg substitutes. Dried beans, peas, lentils, and tofu. Seeds and most nuts. Dairy Low-fat or nonfat cheeses, including ricotta and mozzarella. Skim or 1% milk (liquid, powdered, or evaporated). Buttermilk made with low-fat milk. Nonfat or low-fat  yogurt. Fats and oils Non-hydrogenated (trans-free) margarines. Vegetable oils, including soybean, sesame, sunflower, olive, avocado, peanut, safflower, corn, canola, and cottonseed. Salad dressings or mayonnaise made with a vegetable oil. Beverages Water (mineral or sparkling). Coffee and tea. Unsweetened ice tea. Diet beverages. Sweets and desserts Sherbet, gelatin, and fruit ice. Small amounts of dark chocolate. Limit all sweets and desserts. Seasonings and condiments All seasonings and condiments. The items listed above may not be a complete list of foods and beverages you can eat. Contact a dietitian for more options. What foods should I avoid? Fruits Canned fruit in heavy syrup. Fruit in cream or butter sauce. Fried fruit. Limit coconut. Vegetables Vegetables cooked in cheese, cream, or butter sauce. Fried vegetables. Grains Breads made with saturated or trans fats, oils, or whole milk. Croissants. Sweet rolls. Donuts. High-fat crackers, such as cheese crackers and chips. Meats and other proteins Fatty meats, such as hot dogs, ribs, sausage, bacon, rib-eye roast or steak. High-fat deli meats, such as salami and bologna. Caviar. Domestic duck and goose. Organ meats, such as liver. Dairy Cream, sour cream, cream cheese, and creamed cottage cheese. Whole-milk cheeses. Whole or 2% milk (liquid, evaporated, or condensed). Whole buttermilk. Cream sauce or high-fat cheese sauce. Whole-milk yogurt. Fats and oils Meat fat, or shortening. Cocoa butter, hydrogenated oils, palm oil, coconut oil, palm kernel oil. Solid fats and shortenings, including bacon  fat, salt pork, lard, and butter. Nondairy cream substitutes. Salad dressings with cheese or sour cream. Beverages Regular sodas and any drinks with added sugar. Sweets and desserts Frosting. Pudding. Cookies. Cakes. Pies. Milk chocolate or white chocolate. Buttered syrups. Full-fat ice cream or ice cream drinks. The items listed above may not  be a complete list of foods and beverages to avoid. Contact a dietitian for more information. Summary Heart-healthy meal planning includes limiting unhealthy fats, increasing healthy fats, limiting salt (sodium) intake and making other diet and lifestyle changes. Lose weight if you are overweight. Losing just 5-10% of your body weight can help your overall health and prevent diseases such as diabetes and heart disease. Focus on eating a balance of foods, including fruits and vegetables, low-fat or nonfat dairy, lean protein, nuts and legumes, whole grains, and heart-healthy oils and fats. This information is not intended to replace advice given to you by your health care provider. Make sure you discuss any questions you have with your health care provider. Document Revised: 10/17/2021 Document Reviewed: 10/17/2021 Elsevier Patient Education  2024 [employer].

## 2024-08-11 ENCOUNTER — Ambulatory Visit: Admitting: Nurse Practitioner

## 2024-08-11 DIAGNOSIS — E782 Mixed hyperlipidemia: Secondary | ICD-10-CM

## 2024-08-11 DIAGNOSIS — Z23 Encounter for immunization: Secondary | ICD-10-CM

## 2024-08-11 DIAGNOSIS — R7303 Prediabetes: Secondary | ICD-10-CM

## 2024-08-11 DIAGNOSIS — M791 Myalgia, unspecified site: Secondary | ICD-10-CM

## 2024-08-11 DIAGNOSIS — I10 Essential (primary) hypertension: Secondary | ICD-10-CM

## 2024-08-11 DIAGNOSIS — Z1231 Encounter for screening mammogram for malignant neoplasm of breast: Secondary | ICD-10-CM

## 2024-08-22 NOTE — Patient Instructions (Addendum)
 Please call to schedule your mammogram and/or bone density: [facility] at [facility]  Address: [address], [place], [place] [address] Phone: [phone]   Imaging at [facility] [address] [place],  [place]  [address] Phone: [phone]    Kiara Parker,  Thank you for taking the time for your Medicare Wellness Visit. I appreciate your continued commitment to your health goals. Please review the care plan we discussed, and feel free to reach out if I can assist you further.  Please note that Annual Wellness Visits do not include a physical exam. Some assessments may be limited, especially if the visit was conducted virtually. If needed, we may recommend an in-person follow-up with your provider.  Ongoing Care Seeing your primary care provider every 3 to 6 months helps us  monitor your health and provide consistent, personalized care.   Referrals If a referral was made during today's visit and you haven't received any updates within two weeks, please contact the referred provider directly to check on the status.  Recommended Screenings:  Health Maintenance  Topic Date Due   Breast Cancer Screening  04/22/2024   Zoster (Shingles) Vaccine (1 of 2) 11/22/2024*   COVID-19 Vaccine (9 - Pfizer risk 2025-26 season) 02/25/2025   Medicare Annual Wellness Visit  08/27/2025   DTaP/Tdap/Td vaccine (2 - Td or Tdap) 09/12/2026   Colon Cancer Screening  07/04/2029   Osteoporosis screening with Bone Density Scan  10/08/2029   Pneumococcal Vaccine for age over 72  Completed   Flu Shot  Completed   Hepatitis C Screening  Completed   Meningitis B Vaccine  Aged Out  *Topic was postponed. The date shown is not the original due date.       08/27/2024    9:01 AM  Advanced Directives  Does Patient Have a Medical Advance Directive? No  Would patient like information on creating a medical advance directive? Yes (MAU/Ambulatory/Procedural  Areas - Information given)    Vision: Annual vision screenings are recommended for early detection of glaucoma, cataracts, and diabetic retinopathy. These exams can also reveal signs of chronic conditions such as diabetes and high blood pressure.  Dental: Annual dental screenings help detect early signs of oral cancer, gum disease, and other conditions linked to overall health, including heart disease and diabetes.  Please see the attached documents for additional preventive care recommendations.   Be Involved in Caring For Your Health:  Taking Medications When medications are taken as directed, they can greatly improve your health. But if they are not taken as prescribed, they may not work. In some cases, not taking them correctly can be harmful. To help ensure your treatment remains effective and safe, understand your medications and how to take them. Bring your medications to each visit for review by your provider.  Your lab results, notes, and after visit summary will be available on My Chart. We strongly encourage you to use this feature. If lab results are abnormal the clinic will contact you with the appropriate steps. If the clinic does not contact you assume the results are satisfactory. You can always view your results on My Chart. If you have questions regarding your health or results, please contact the clinic during office hours. You can also ask questions on My Chart.  We at [facility] are grateful that you chose us  to provide your care. We strive to provide evidence-based and compassionate care and are always looking for feedback. If you  get a survey from the clinic please complete this so we can hear your opinions.   Prediabetes: What to Know Prediabetes is when your blood sugar, also called glucose, is at a higher level than normal but not high enough for you to be diagnosed with type 2 diabetes (type 2 diabetes mellitus). Having prediabetes puts you at risk for  getting type 2 diabetes. By making some healthy changes, you may be able to prevent or delay getting type 2 diabetes. This is important because type 2 diabetes can lead to serious problems. Some of these include: Heart disease. Stroke. Blindness. Kidney disease. Depression. Poor blood flow in the feet and legs. In very bad cases, this could lead to having a leg removed by surgery (amputation). What are the causes? The exact cause of prediabetes isn't known. It may result from insulin resistance. Insulin resistance happens when cells in the body don't respond properly to insulin that the body makes. This can cause too much sugar to build up in the blood. High blood sugar, also called hyperglycemia, can develop. What increases the risk? Having a family member with type 2 diabetes. Being older than 73 years of age. Having had a temporary form of diabetes during a pregnancy. This is called gestational diabetes. Having had polycystic ovary syndrome (PCOS). Being overweight or obese. Being inactive and not getting much exercise. Having a history of heart disease. This may include problems with cholesterol levels, high levels of blood fats, or high blood pressure. What are the signs or symptoms? You may have no symptoms. If you do have symptoms, they may include: Increased hunger. Increased thirst. Needing to pee more often. Changes in how you see, like blurry vision. Feeling tired. How is this diagnosed? Prediabetes can be diagnosed with blood tests that check your blood sugar. One or more of these tests may be done: A fasting blood glucose (FBG) test. You won't be allowed to eat (you will fast) for at least 8 hours before a blood sample is taken. An A1C blood test, also called a hemoglobin A1C test. This test shows information about blood sugar levels over the past 2?3 months. An oral glucose tolerance test (OGTT). This test measures your blood sugar at two points in time: After you haven't  eaten for a while. This is your baseline level. Two hours after you drink a beverage that has sugar in it. You may be diagnosed with prediabetes if: Your FBG is 100?125 mg/dL (4.3-3.0 mmol/L). Your A1C level is 5.7?6.4% (39-46 mmol/mol). Your OGTT result is 140?199 mg/dL (2.1-88 mmol/L). These blood tests may need to be done again to be sure of the diagnosis. How is this treated? Treatment may include making changes to your diet and lifestyle. These changes can help lower your blood sugar and keep you from getting type 2 diabetes. In some cases, medicine may be given to help lower your risk. Follow these instructions at home: Eating and drinking  Eat and drink as told. Follow a healthy meal plan. This includes eating lean proteins, whole grains, legumes, fresh fruits and vegetables, low-fat dairy products, and healthy fats. Meet with an expert in healthy eating called a dietitian. This person can help create a healthy eating plan that's right for you. Lifestyle Do moderate-intensity exercise. Do this for at least 30 minutes a day on 5 or more days each week, or as told by your health care provider. A mix of activities may be best. Good choices include brisk walking, swimming, biking, and weight  lifting. Try to lose weight if your provider says it's OK. Losing 5-7% of your body weight can help reverse insulin resistance. Do not drink alcohol if: Your provider tells you not to drink. You're pregnant, may be pregnant, or plan to become pregnant. If you drink alcohol: Limit how much you have to: 0-1 drink a day if you're female. 0-2 drinks a day if you're female. Know how much alcohol is in your drink. In the [place] one drink is one 12 oz bottle of beer (355 mL), one 5 oz glass of wine (148 mL), or one 1 oz glass of hard liquor (44 mL). General instructions Take medicines only as told. You may be given medicines that help lower the risk of type 2 diabetes. Do not smoke, vape, or use nicotine  or tobacco. Where to find more information American Diabetes Association: diabetes.org/about-diabetes/prediabetes Academy of Nutrition and Dietetics: eatright.org American Heart Association: Go to thisjobs.cz. Click the search icon. Type prediabetes in the search box. Contact a health care provider if: You have any of these symptoms: Increased hunger. Peeing more often than usual. Increased thirst. Feeling tired. Changes in how you see, like blurry vision. Feeling like you may throw up. Throwing up. Get help right away if: You have shortness of breath. You feel confused. This information is not intended to replace advice given to you by your health care provider. Make sure you discuss any questions you have with your health care provider. Document Revised: 04/15/2023 Document Reviewed: 04/15/2023 Elsevier Patient Education  2024 [employer].

## 2024-08-27 ENCOUNTER — Ambulatory Visit (INDEPENDENT_AMBULATORY_CARE_PROVIDER_SITE_OTHER): Admitting: Nurse Practitioner

## 2024-08-27 ENCOUNTER — Encounter: Payer: Self-pay | Admitting: Nurse Practitioner

## 2024-08-27 VITALS — BP 121/80 | HR 59 | Temp 98.4°F | Resp 16 | Ht 64.02 in | Wt 208.0 lb

## 2024-08-27 DIAGNOSIS — I10 Essential (primary) hypertension: Secondary | ICD-10-CM

## 2024-08-27 DIAGNOSIS — Z23 Encounter for immunization: Secondary | ICD-10-CM

## 2024-08-27 DIAGNOSIS — E782 Mixed hyperlipidemia: Secondary | ICD-10-CM

## 2024-08-27 DIAGNOSIS — Z0000 Encounter for general adult medical examination without abnormal findings: Secondary | ICD-10-CM

## 2024-08-27 DIAGNOSIS — Z1231 Encounter for screening mammogram for malignant neoplasm of breast: Secondary | ICD-10-CM | POA: Diagnosis not present

## 2024-08-27 DIAGNOSIS — T466X5A Adverse effect of antihyperlipidemic and antiarteriosclerotic drugs, initial encounter: Secondary | ICD-10-CM

## 2024-08-27 DIAGNOSIS — M791 Myalgia, unspecified site: Secondary | ICD-10-CM

## 2024-08-27 DIAGNOSIS — R7303 Prediabetes: Secondary | ICD-10-CM | POA: Diagnosis not present

## 2024-08-27 LAB — BAYER DCA HB A1C WAIVED: HB A1C (BAYER DCA - WAIVED): 5.8 % — ABNORMAL HIGH (ref 4.8–5.6)

## 2024-08-27 NOTE — Assessment & Plan Note (Signed)
At this time is tolerating Crestor on 3 day a week schedule, adjust this as needed.   If symptoms present then consider weekly dosing or change to Repatha.  Lipid panel today. 

## 2024-08-27 NOTE — Progress Notes (Signed)
 Chief Complaint  Patient presents with   HTN/HLD    Likely was ok prior to this week when her sister had a stroke.    Prediabetes    Not to good with her diet.    Medicare Wellness     Subjective:   Kiara Parker is a 73 y.o. female who presents for a Medicare Annual Wellness Visit.  Visit info / Clinical Intake: Medicare Wellness Visit Type:: Subsequent Annual Wellness Visit Persons participating in visit and providing information:: patient Medicare Wellness Visit Mode:: In-person (required for WTM) Interpreter Needed?: No Pre-visit prep was completed: yes AWV questionnaire completed by patient prior to visit?: no Living arrangements:: lives with spouse/significant other Patient's Overall Health Status Rating: very good Typical amount of pain: some Does pain affect daily life?: no Are you currently prescribed opioids?: no  Dietary Habits and Nutritional Risks How many meals a day?: 2 Eats fruit and vegetables daily?: yes Most meals are obtained by: preparing own meals In the last 2 weeks, have you had any of the following?: none Diabetic:: no  Functional Status Activities of Daily Living (to include ambulation/medication): Independent Ambulation: Independent Medication Administration: Independent Home Management (perform basic housework or laundry): Independent Manage your own finances?: yes Primary transportation is: driving Concerns about vision?: no *vision screening is required for WTM* Concerns about hearing?: no  Fall Screening Falls in the past year?: 1 Number of falls in past year: 0 Was there an injury with Fall?: 0 Fall Risk Category Calculator: 1 Patient Fall Risk Level: Low Fall Risk  Fall Risk Patient at Risk for Falls Due to: No Fall Risks Fall risk Follow up: Falls evaluation completed  Home and Transportation Safety: All rugs have non-skid backing?: yes All stairs or steps have railings?: yes Grab bars in the bathtub or shower?:  yes Have non-skid surface in bathtub or shower?: yes Good home lighting?: yes Regular seat belt use?: yes Hospital stays in the last year:: no  Cognitive Assessment Difficulty concentrating, remembering, or making decisions? : no Will 6CIT or Mini Cog be Completed: yes What year is it?: 0 points What month is it?: 0 points Give patient an address phrase to remember (5 components): [address] [place] [address] About what time is it?: 0 points Count backwards from 20 to 1: 0 points Say the months of the year in reverse: 0 points Repeat the address phrase from earlier: 2 points 6 CIT Score: 2 points  Advance Directives (For Healthcare) Does Patient Have a Medical Advance Directive?: No Would patient like information on creating a medical advance directive?: Yes (MAU/Ambulatory/Procedural Areas - Information given)  Reviewed/Updated  Reviewed/Updated: Reviewed All (Medical, Surgical, Family, Medications, Allergies, Care Teams, Patient Goals); Medical History; Surgical History; Family History; Medications; Allergies; Care Teams; Patient Goals    Allergies (verified) Atorvastatin , Clavulanic acid, Erythromycin, Penicillins, Pravastatin , and Zithromax [azithromycin]   Current Medications (verified) Outpatient Encounter Medications as of 08/27/2024  Medication Sig   Ascorbic Acid (VITAMIN C) 1000 MG tablet Take 1,000 mg by mouth daily.   baclofen  (LIORESAL ) 10 MG tablet TAKE 1/2 TABLET BY MOUTH AT BEDTIME   Cholecalciferol  25 MCG (1000 UT) capsule Take 2,000 Units by mouth daily.   cyanocobalamin (VITAMIN B12) 1000 MCG tablet Take 1,000 mcg by mouth daily.   ezetimibe  (ZETIA ) 10 MG tablet Take 1 tablet (10 mg total) by mouth daily.   gabapentin  (NEURONTIN ) 300 MG capsule TAKE 1 CAPSULE BY MOUTH THREE TIMES A DAY   hydrochlorothiazide  (HYDRODIURIL ) 12.5  MG tablet Take 1 tablet (12.5 mg total) by mouth daily.   meloxicam  (MOBIC ) 15 MG tablet Take 1 tablet (15 mg total) by mouth daily.    Multiple Vitamin (MULTIVITAMIN) tablet Take 1 tablet by mouth daily.   rosuvastatin  (CRESTOR ) 5 MG tablet TAKE 1 TABLET (5 MG TOTAL) BY MOUTH 3 (THREE) TIMES A WEEK   No facility-administered encounter medications on file as of 08/27/2024.    History: Past Medical History:  Diagnosis Date   Chronic pain syndrome    Hyperlipidemia    Hypertension    Lumbago    Spinal stenosis    Past Surgical History:  Procedure Laterality Date   COLONOSCOPY N/A 07/04/2024   Procedure: COLONOSCOPY;  Surgeon: Therisa Bi, MD;  Location: [facility] ENDOSCOPY;  Service: Gastroenterology;  Laterality: N/A;   COLONOSCOPY WITH PROPOFOL  N/A 11/27/2018   Procedure: COLONOSCOPY WITH PROPOFOL ;  Surgeon: Therisa Bi, MD;  Location: [facility] ENDOSCOPY;  Service: Gastroenterology;  Laterality: N/A;   POLYPECTOMY  07/04/2024   Procedure: POLYPECTOMY, INTESTINE;  Surgeon: Therisa Bi, MD;  Location: [facility] ENDOSCOPY;  Service: Gastroenterology;;   TUBAL LIGATION     Family History  Problem Relation Age of Onset   Cancer Mother        breast   Arthritis Mother    Osteoporosis Mother    Breast cancer Mother 59   Heart disease Father    Lung disease Father    Cancer Sister        lymphoma   Multiple sclerosis Sister    Cancer Maternal Grandmother        lymphoma   Social History   Occupational History   Occupation: retired  Tobacco Use   Smoking status: Never   Smokeless tobacco: Never  Vaping Use   Vaping status: Never Used  Substance and Sexual Activity   Alcohol use: No   Drug use: No   Sexual activity: Yes    Birth control/protection: None   Tobacco Counseling Counseling given: Not Answered  SDOH Screenings   Food Insecurity: Food Insecurity Present (06/20/2024)   [facility]  Housing: Low Risk  (06/20/2024)   Received [facility]  Transportation Needs: No Transportation Needs (06/20/2024)   Received from [facility]   Utilities: Not At Risk (06/20/2024)   Received from [facility]  Alcohol Screen: Low Risk  (08/20/2023)  Depression (PHQ2-9): Medium Risk (02/06/2024)  Financial Resource Strain: Low Risk  (06/20/2024)   Received from [facility]  Physical Activity: Sufficiently Active (08/20/2023)  Social Connections: Moderately Integrated (08/20/2023)  Stress: No Stress Concern Present (08/20/2023)  Tobacco Use: Low Risk  (08/27/2024)  Health Literacy: Adequate Health Literacy (08/20/2023)   See flowsheets for full screening details  Depression Screen PHQ 2 & 9 Depression Scale- Over the past 2 weeks, how often have you been bothered by any of the following problems? Little interest or pleasure in doing things: 1 Feeling down, depressed, or hopeless (PHQ Adolescent also includes...irritable): 1 PHQ-2 Total Score: 2 Trouble falling or staying asleep, or sleeping too much: 1 Feeling tired or having little energy: 2 Poor appetite or overeating (PHQ Adolescent also includes...weight loss): 1 Feeling bad about yourself - or that you are a failure or have let yourself or your family down: 1 Trouble concentrating on things, such as reading the newspaper or watching television (PHQ Adolescent also includes...like school work): 1 Moving or speaking so slowly that other people could have noticed.  Or the opposite - being so fidgety or restless that you have been moving around a lot more than usual: 0 Thoughts that you would be better off dead, or of hurting yourself in some way: 0 PHQ-9 Total Score: 8 If you checked off any problems, how difficult have these problems made it for you to do your work, take care of things at home, or get along with other people?: Somewhat difficult     Goals Addressed               This Visit's Progress     Weight (lb) < 180 lb (81.6 kg) (pt-stated)   208 lb (94.3 kg)            Objective:    Today's Vitals   08/27/24 0859  BP:  121/80  Pulse: (!) 59  Resp: 16  Temp: 98.4 F (36.9 C)  TempSrc: Oral  SpO2: 97%  Weight: 208 lb (94.3 kg)  Height: 5' 4.02 (1.626 m)  PainSc: 0-No pain   Body mass index is 35.69 kg/m.  Hearing/Vision screen No results found. Immunizations and Health Maintenance Health Maintenance  Topic Date Due   Mammogram  04/22/2024   Medicare Annual Wellness (AWV)  08/19/2024   Zoster Vaccines- Shingrix  (1 of 2) 11/22/2024 (Originally 08/15/1970)   COVID-19 Vaccine (9 - Pfizer risk 2025-26 season) 02/25/2025   DTaP/Tdap/Td (2 - Td or Tdap) 09/12/2026   Colonoscopy  07/04/2029   Bone Density Scan  10/08/2029   Pneumococcal Vaccine: 50+ Years  Completed   Influenza Vaccine  Completed   Hepatitis C Screening  Completed   Meningococcal B Vaccine  Aged Out        Assessment/Plan:  This is a routine wellness examination for [place].  Patient Care Team: Valerio Melanie DASEN, NP as PCP - General (Nurse Practitioner) Jackquline Sawyer, MD (Dermatology) [facility], [facility]) Dodson Delon FERNS, MD as Referring Physician (Physical Medicine and Rehabilitation)  I have personally reviewed and noted the following in the patient's chart:   Medical and social history Use of alcohol, tobacco or illicit drugs  Current medications and supplements including opioid prescriptions. Functional ability and status Nutritional status Physical activity Advanced directives List of other physicians Hospitalizations, surgeries, and ER visits in previous 12 months Vitals Screenings to include cognitive, depression, and falls Referrals and appointments  Orders Placed This Encounter  Procedures   MM 3D SCREENING MAMMOGRAM BILATERAL BREAST    Standing Status:   Future    Expiration Date:   08/27/2025    Reason for Exam (SYMPTOM  OR DIAGNOSIS REQUIRED):   Screening    Preferred imaging location?:   [facility]    Release to patient:   Immediate   Pfizer Comirnaty Covid-19 Vaccine  45yrs & older   Flu vaccine HIGH DOSE PF(Fluzone Trivalent)   Bayer DCA Hb A1c Waived   Comprehensive metabolic panel with GFR   Lipid Panel w/o Chol/HDL Ratio   In addition, I have reviewed and discussed with patient certain preventive protocols, quality metrics, and best practice recommendations. A written personalized care plan for preventive services as well as general preventive health recommendations were provided to patient.   Comer HERO Cesiah Westley, CMA   08/27/2024   Return in about 6 months (around 02/25/2025) for Annual Physical after 02/05/25.  After Visit Summary: (In Person-Printed) AVS printed and given to the patient

## 2024-08-27 NOTE — Assessment & Plan Note (Signed)
 Chronic, stable.  BP on lower side of normal, but stable and no symptoms with this. Continue low dose HCTZ and adjust as needed. Recommend she monitor BP at least a few mornings a week at home and document.  DASH diet at home.  Labs today: CMP.  Urine ALB 10 in May 2025, may benefit from ACE or ARB in future.  Return in 6 months.

## 2024-08-27 NOTE — Assessment & Plan Note (Signed)
Chronic, ongoing.  Is tolerating Crestor on 3 day week schedule + Zetia daily.  Will continue this and adjust as needed.  Obtain lipid panel today.

## 2024-08-27 NOTE — Assessment & Plan Note (Signed)
 A1c trend down to 5.8% last check -- will recheck today, she continues to be between 5.6 to 6.3% on review.  Will adjust plan of care and add medication as needed.  Continue diet focus at this time.  Urine ALB 02 Feb 2024, consider ACE or ARB in future.

## 2024-08-27 NOTE — Progress Notes (Signed)
 Annual Wellness Visit  Patient: Kiara Parker, Female    DOB: Jul 13, 1951, 73 y.o.   MRN: [id number]  Subjective  Chief Complaint  Patient presents with   HTN/HLD    Likely was ok prior to this week when her sister had a stroke.    Prediabetes    Not to good with her diet.    Medicare Wellness    Kiara Parker is a 73 y.o. female who presents today for her Annual Wellness Visit and Follow-up for chronic health issues.  She reports consuming a general diet. The patient does not participate in regular exercise at present. She generally feels fairly well. She reports sleeping fairly well.  HYPERTENSION / HYPERLIPIDEMIA Takes HCTZ 12.5 MG daily, Crestor  5 MG three days a week + Zetia  daily. Had one cardiology visit in the past. Known mild aortic stenosis and mild tricuspid and mitral regurgitation.  Last visit was 09/01/2020 and echo 03/03/2019. Her sister recently had a stroke. Satisfied with current treatment? yes Duration of hypertension: chronic BP monitoring frequency: not checking BP range:  BP medication side effects: no Duration of hyperlipidemia: chronic Cholesterol medication side effects: no Cholesterol supplements: none Medication compliance: good compliance Aspirin: no Recent stressors: no Recurrent headaches: no Visual changes: no Palpitations: no Dyspnea: no Chest pain: no Lower extremity edema: no -- wears compression Dizzy/lightheaded: no   Impaired Fasting Glucose HbA1C:  Lab Results  Component Value Date   HGBA1C 5.8 (H) 02/06/2024  Duration of elevated blood sugar: chronic Polydipsia: no Polyuria: no Weight change: no Visual disturbance: no Glucose Monitoring: no    Accucheck frequency: Not Checking    Fasting glucose:     Post prandial:  Diabetic Education: Not Completed Family history of diabetes: yes   HPI  Vision:Within last year and Dental: No current dental problems  Patient Active Problem List   Diagnosis Date Noted   Mild  aortic stenosis by prior echocardiogram 08/08/2023   Lumbar radiculitis 09/13/2020   Myalgia due to statin 12/06/2018   Prediabetes 12/05/2018   Vitamin D  deficiency 11/01/2018   Morbid obesity (HCC) 12/13/2016   Hyperlipidemia    Hypertension    Chronic pain syndrome    Spinal stenosis    Past Medical History:  Diagnosis Date   Chronic pain syndrome    Hyperlipidemia    Hypertension    Lumbago    Spinal stenosis    Past Surgical History:  Procedure Laterality Date   COLONOSCOPY N/A 07/04/2024   Procedure: COLONOSCOPY;  Surgeon: Therisa Bi, MD;  Location: [facility] ENDOSCOPY;  Service: Gastroenterology;  Laterality: N/A;   COLONOSCOPY WITH PROPOFOL  N/A 11/27/2018   Procedure: COLONOSCOPY WITH PROPOFOL ;  Surgeon: Therisa Bi, MD;  Location: [facility] ENDOSCOPY;  Service: Gastroenterology;  Laterality: N/A;   POLYPECTOMY  07/04/2024   Procedure: POLYPECTOMY, INTESTINE;  Surgeon: Therisa Bi, MD;  Location: [facility] ENDOSCOPY;  Service: Gastroenterology;;   TUBAL LIGATION     Social History   Tobacco Use   Smoking status: Never   Smokeless tobacco: Never  Vaping Use   Vaping status: Never Used  Substance Use Topics   Alcohol use: No   Drug use: No   Family History  Problem Relation Age of Onset   Cancer Mother        breast   Arthritis Mother    Osteoporosis Mother    Breast cancer Mother 39   Heart disease Father    Lung disease Father    Cancer Sister  lymphoma   Multiple sclerosis Sister    Cancer Maternal Grandmother        lymphoma   Allergies  Allergen Reactions   Atorvastatin  Other (See Comments)   Clavulanic Acid    Erythromycin    Penicillins Hives   Pravastatin  Other (See Comments)   Zithromax [Azithromycin] Hives    Medications: Outpatient Medications Prior to Visit  Medication Sig   Ascorbic Acid (VITAMIN C) 1000 MG tablet Take 1,000 mg by mouth daily.   baclofen  (LIORESAL ) 10 MG tablet TAKE 1/2 TABLET BY MOUTH AT BEDTIME   Cholecalciferol  25  MCG (1000 UT) capsule Take 2,000 Units by mouth daily.   cyanocobalamin (VITAMIN B12) 1000 MCG tablet Take 1,000 mcg by mouth daily.   ezetimibe  (ZETIA ) 10 MG tablet Take 1 tablet (10 mg total) by mouth daily.   gabapentin  (NEURONTIN ) 300 MG capsule TAKE 1 CAPSULE BY MOUTH THREE TIMES A DAY   hydrochlorothiazide  (HYDRODIURIL ) 12.5 MG tablet Take 1 tablet (12.5 mg total) by mouth daily.   meloxicam  (MOBIC ) 15 MG tablet Take 1 tablet (15 mg total) by mouth daily.   Multiple Vitamin (MULTIVITAMIN) tablet Take 1 tablet by mouth daily.   rosuvastatin  (CRESTOR ) 5 MG tablet TAKE 1 TABLET (5 MG TOTAL) BY MOUTH 3 (THREE) TIMES A WEEK   No facility-administered medications prior to visit.    Allergies  Allergen Reactions   Atorvastatin  Other (See Comments)   Clavulanic Acid    Erythromycin    Penicillins Hives   Pravastatin  Other (See Comments)   Zithromax [Azithromycin] Hives    Patient Care Team: Valerio Melanie DASEN, NP as PCP - General (Nurse Practitioner) Jackquline Sawyer, MD (Dermatology) [place], [facility] (Optometry) Dodson Delon FERNS, MD as Referring Physician (Physical Medicine and Rehabilitation)  Review of Systems  Constitutional:  Negative for fever, malaise/fatigue and weight loss.  Respiratory:  Negative for cough, sputum production, shortness of breath and wheezing.   Cardiovascular:  Negative for chest pain, orthopnea and leg swelling.  Neurological: Negative.   Psychiatric/Behavioral: Negative.        Objective  BP 121/80 (BP Location: Left Arm, Patient Position: Sitting, Cuff Size: Large)   Pulse (!) 59   Temp 98.4 F (36.9 C) (Oral)   Resp 16   Ht 5' 4.02 (1.626 m)   Wt 208 lb (94.3 kg)   LMP  (LMP Unknown)   SpO2 97%   BMI 35.69 kg/m  BP Readings from Last 3 Encounters:  08/27/24 121/80  07/04/24 98/71  02/06/24 102/66   Wt Readings from Last 3 Encounters:  08/27/24 208 lb (94.3 kg)  07/04/24 208 lb 3.2 oz (94.4 kg)  02/06/24 207 lb 12.8 oz (94.3  kg)   Physical Exam Vitals and nursing note reviewed.  Constitutional:      General: She is awake. She is not in acute distress.    Appearance: She is well-developed and well-groomed. She is obese. She is not ill-appearing or toxic-appearing.  HENT:     Head: Normocephalic.     Right Ear: Hearing and external ear normal.     Left Ear: Hearing and external ear normal.  Eyes:     General: Lids are normal.        Right eye: No discharge.        Left eye: No discharge.     Conjunctiva/sclera: Conjunctivae normal.     Pupils: Pupils are equal, round, and reactive to light.  Neck:     Thyroid : No thyromegaly.  Vascular: No carotid bruit.  Cardiovascular:     Rate and Rhythm: Normal rate and regular rhythm.     Heart sounds: Normal heart sounds. No murmur heard.    No gallop.  Pulmonary:     Effort: Pulmonary effort is normal. No accessory muscle usage or respiratory distress.     Breath sounds: Normal breath sounds.  Abdominal:     General: Bowel sounds are normal. There is no distension.     Palpations: Abdomen is soft.     Tenderness: There is no abdominal tenderness.  Musculoskeletal:     Cervical back: Normal range of motion and neck supple.     Right lower leg: No edema.     Left lower leg: No edema.  Lymphadenopathy:     Cervical: No cervical adenopathy.  Skin:    General: Skin is warm and dry.  Neurological:     Mental Status: She is alert and oriented to person, place, and time.     Deep Tendon Reflexes: Reflexes are normal and symmetric.     Reflex Scores:      Brachioradialis reflexes are 2+ on the right side and 2+ on the left side.      Patellar reflexes are 2+ on the right side and 2+ on the left side. Psychiatric:        Attention and Perception: Attention normal.        Mood and Affect: Mood normal.        Speech: Speech normal.        Behavior: Behavior normal. Behavior is cooperative.        Thought Content: Thought content normal.    Most recent  depression screenings:    02/06/2024    8:15 AM 08/20/2023   11:35 AM 08/08/2023    8:57 AM 02/05/2023    1:21 PM 08/21/2022   10:56 AM  Depression screen PHQ 2/9  Decreased Interest 1 0 0 0 0  Down, Depressed, Hopeless 1 0 1 0 0  PHQ - 2 Score 2 0 1 0 0  Altered sleeping 1 0 1 2 1   Tired, decreased energy 2 1 1 2  0  Change in appetite 1 0 3 3 0  Feeling bad or failure about yourself  1 0 0 0 0  Trouble concentrating 1 0 1 0 0  Moving slowly or fidgety/restless 0 0 1 0 0  Suicidal thoughts 0 0 0 0 0  PHQ-9 Score 8  1  8  7  1    Difficult doing work/chores Somewhat difficult Not difficult at all Somewhat difficult Not difficult at all Not difficult at all     Data saved with a previous flowsheet row definition       08/08/2023    8:57 AM 02/05/2023    1:21 PM 08/22/2022   10:46 AM 01/25/2022    9:12 AM  GAD 7 : Generalized Anxiety Score  Nervous, Anxious, on Edge 0 0 0 0  Control/stop worrying 0 0 0 0  Worry too much - different things 1 0 0 0  Trouble relaxing 1 0 0 0  Restless 0 0 0 0  Easily annoyed or irritable 1 0 0 0  Afraid - awful might happen 1 0 0 0  Total GAD 7 Score 4 0 0 0  Anxiety Difficulty Not difficult at all Not difficult at all Not difficult at all Not difficult at all   Visit info / Clinical Intake: Medicare Wellness Visit Type:: Subsequent Annual  Wellness Visit Persons participating in visit and providing information:: patient Medicare Wellness Visit Mode:: In-person (required for WTM) Interpreter Needed?: No Pre-visit prep was completed: yes AWV questionnaire completed by patient prior to visit?: no Living arrangements:: lives with spouse/significant other Patient's Overall Health Status Rating: very good Typical amount of pain: some Does pain affect daily life?: no Are you currently prescribed opioids?: no  Dietary Habits and Nutritional Risks How many meals a day?: 2 Eats fruit and vegetables daily?: yes Most meals are obtained by: preparing  own meals In the last 2 weeks, have you had any of the following?: none Diabetic:: no  Functional Status Activities of Daily Living (to include ambulation/medication): Independent Ambulation: Independent Medication Administration: Independent Home Management (perform basic housework or laundry): Independent Manage your own finances?: yes Primary transportation is: driving Concerns about vision?: no *vision screening is required for WTM* Concerns about hearing?: no  Fall Screening Falls in the past year?: 1 Number of falls in past year: 0 Was there an injury with Fall?: 0 Fall Risk Category Calculator: 1 Patient Fall Risk Level: Low Fall Risk  Fall Risk Patient at Risk for Falls Due to: No Fall Risks Fall risk Follow up: Falls evaluation completed  Home and Transportation Safety: All rugs have non-skid backing?: yes All stairs or steps have railings?: yes Grab bars in the bathtub or shower?: yes Have non-skid surface in bathtub or shower?: yes Good home lighting?: yes Regular seat belt use?: yes Hospital stays in the last year:: no  Cognitive Assessment Difficulty concentrating, remembering, or making decisions? : no Will 6CIT or Mini Cog be Completed: yes What year is it?: 0 points What month is it?: 0 points Give patient an address phrase to remember (5 components): [address] [place] [address] About what time is it?: 0 points Count backwards from 20 to 1: 0 points Say the months of the year in reverse: 0 points Repeat the address phrase from earlier: 2 points 6 CIT Score: 2 points  Advance Directives (For Healthcare) Does Patient Have a Medical Advance Directive?: No Would patient like information on creating a medical advance directive?: Yes (MAU/Ambulatory/Procedural Areas - Information given)  Reviewed/Updated  Reviewed/Updated: Reviewed All (Medical, Surgical, Family, Medications, Allergies, Care Teams, Patient Goals); Medical History; Surgical History; Family  History; Medications; Allergies; Care Teams; Patient Goals   Vision/Hearing Screen: No results found.  Last CBC Lab Results  Component Value Date   WBC 5.8 02/06/2024   HGB 13.9 02/06/2024   HCT 43.7 02/06/2024   MCV 91 02/06/2024   MCH 29.1 02/06/2024   RDW 13.2 02/06/2024   PLT 270 02/06/2024   Last metabolic panel Lab Results  Component Value Date   GLUCOSE 93 02/06/2024   NA 141 02/06/2024   K 4.0 02/06/2024   CL 100 02/06/2024   CO2 23 02/06/2024   BUN 17 02/06/2024   CREATININE 0.76 02/06/2024   EGFR 84 02/06/2024   CALCIUM  9.4 02/06/2024   PROT 6.3 02/06/2024   ALBUMIN 4.3 02/06/2024   LABGLOB 2.0 02/06/2024   AGRATIO 1.4 02/05/2023   BILITOT 0.4 02/06/2024   ALKPHOS 71 02/06/2024   AST 19 02/06/2024   ALT 19 02/06/2024   Last lipids Lab Results  Component Value Date   CHOL 159 02/06/2024   HDL 49 02/06/2024   LDLCALC 88 02/06/2024   TRIG 122 02/06/2024   Last hemoglobin A1c Lab Results  Component Value Date   HGBA1C 5.8 (H) 02/06/2024   Last thyroid  functions Lab  Results  Component Value Date   TSH 1.990 02/06/2024   Last vitamin D  Lab Results  Component Value Date   VD25OH 36.6 02/06/2024    No results found for any visits on 08/27/24.    Assessment & Plan   Annual wellness visit done today including the all of the following: Reviewed patient's Family and Medical History Reviewed and updated list of patient's medical providers Assessment of cognitive impairment was done Assessed patient's functional ability Established a written schedule for health screening services Health Risk Assessent Completed and Reviewed  Exercise Activities and Dietary recommendations  Goals       Weight (lb) < 180 lb (81.6 kg) (pt-stated)        Immunization History  Administered Date(s) Administered   Fluad Quad(high Dose 65+) 07/06/2021, 11/27/2022   INFLUENZA, HIGH DOSE SEASONAL PF 07/24/2018, 06/27/2019, 07/19/2021, 08/07/2023, 08/27/2024    Influenza,inj,Quad PF,6+ Mos 09/12/2016   Influenza-Unspecified 07/02/2020   PFIZER Comirnaty(Gray Top)Covid-19 Tri-Sucrose Vaccine 02/07/2021   PFIZER(Purple Top)SARS-COV-2 Vaccination 11/18/2019, 12/09/2019, 07/06/2020, 08/07/2023   Pfizer Covid-19 Vaccine Bivalent Booster 75yrs & up 06/17/2021   Pfizer(Comirnaty)Fall Seasonal Vaccine 12 years and older 11/27/2022, 08/07/2023, 08/27/2024   Pneumococcal Conjugate-13 07/24/2018   Pneumococcal Polysaccharide-23 08/23/2019   Tdap 09/12/2016    Health Maintenance  Topic Date Due   Mammogram  04/22/2024   Zoster Vaccines- Shingrix  (1 of 2) 11/22/2024 (Originally 08/15/1970)   COVID-19 Vaccine (9 - Pfizer risk 2025-26 season) 02/25/2025   Medicare Annual Wellness (AWV)  08/27/2025   DTaP/Tdap/Td (2 - Td or Tdap) 09/12/2026   Colonoscopy  07/04/2029   Bone Density Scan  10/08/2029   Pneumococcal Vaccine: 50+ Years  Completed   Influenza Vaccine  Completed   Hepatitis C Screening  Completed   Meningococcal B Vaccine  Aged Out   Discussed health benefits of physical activity, and encouraged her to engage in regular exercise appropriate for her age and condition.    Problem List Items Addressed This Visit       Cardiovascular and Mediastinum   Hypertension (Chronic)   Chronic, stable.  BP on lower side of normal, but stable and no symptoms with this. Continue low dose HCTZ and adjust as needed. Recommend she monitor BP at least a few mornings a week at home and document.  DASH diet at home.  Labs today: CMP.  Urine ALB 10 in May 2025, may benefit from ACE or ARB in future.  Return in 6 months.         Other   Prediabetes (Chronic)   A1c trend down to 5.8% last check -- will recheck today, she continues to be between 5.6 to 6.3% on review.  Will adjust plan of care and add medication as needed.  Continue diet focus at this time.  Urine ALB 02 Feb 2024, consider ACE or ARB in future.      Relevant Orders   Bayer DCA Hb A1c Waived    Myalgia due to statin (Chronic)   At this time is tolerating Crestor  on 3 day a week schedule, adjust this as needed.  If symptoms present then consider weekly dosing or change to Repatha.  Lipid panel today.      Morbid obesity (HCC) (Chronic)   BMI 35.69 with HTN and Prediabetes.  Recommended eating smaller high protein, low fat meals more frequently and exercising 30 mins a day 5 times a week with a goal of 10-15lb weight loss in the next 3 months. Patient voiced their understanding and motivation  to adhere to these recommendations.       Hyperlipidemia (Chronic)   Chronic, ongoing.  Is tolerating Crestor  on 3 day week schedule + Zetia  daily.  Will continue this and adjust as needed.  Obtain lipid panel today.      Relevant Orders   Comprehensive metabolic panel with GFR   Lipid Panel w/o Chol/HDL Ratio   Other Visit Diagnoses       Encounter for Medicare annual wellness exam    -  Primary   Medicare Wellness performed today.     Encounter for screening mammogram for malignant neoplasm of breast       Mammogram ordered today and instructed how to schedule.   Relevant Orders   MM 3D SCREENING MAMMOGRAM BILATERAL BREAST     Need for COVID-19 vaccine       Covid vaccine provided today.   Relevant Orders   Pfizer Comirnaty Covid-19 Vaccine 69yrs & older (Completed)     Flu vaccine need       Flu vaccine provided today.   Relevant Orders   Flu vaccine HIGH DOSE PF(Fluzone Trivalent) (Completed)       Return in 6 months (on 02/25/2025) for Annual Physical after 02/05/25.     Adanely Reynoso T Jiya Kissinger, NP

## 2024-08-27 NOTE — Assessment & Plan Note (Signed)
 BMI 35.69 with HTN and Prediabetes.  Recommended eating smaller high protein, low fat meals more frequently and exercising 30 mins a day 5 times a week with a goal of 10-15lb weight loss in the next 3 months. Patient voiced their understanding and motivation to adhere to these recommendations.

## 2024-08-28 LAB — LIPID PANEL W/O CHOL/HDL RATIO
Cholesterol, Total: 165 mg/dL (ref 100–199)
HDL: 53 mg/dL (ref >39)
LDL Chol Calc (NIH): 89 mg/dL (ref 0–99)
Triglycerides: 130 mg/dL (ref 0–149)
VLDL Cholesterol Cal: 23 mg/dL (ref 5–40)

## 2024-08-28 LAB — COMPREHENSIVE METABOLIC PANEL WITH GFR
ALT: 23 IU/L (ref 0–32)
AST: 23 IU/L (ref 0–40)
Albumin: 4.5 g/dL (ref 3.8–4.8)
Alkaline Phosphatase: 71 IU/L (ref 49–135)
BUN/Creatinine Ratio: 14 (ref 12–28)
BUN: 11 mg/dL (ref 8–27)
Bilirubin Total: 0.4 mg/dL (ref 0.0–1.2)
CO2: 26 mmol/L (ref 20–29)
Calcium: 10.2 mg/dL (ref 8.7–10.3)
Chloride: 102 mmol/L (ref 96–106)
Creatinine, Ser: 0.77 mg/dL (ref 0.57–1.00)
Globulin, Total: 2.4 g/dL (ref 1.5–4.5)
Glucose: 79 mg/dL (ref 70–99)
Potassium: 4.3 mmol/L (ref 3.5–5.2)
Sodium: 142 mmol/L (ref 134–144)
Total Protein: 6.9 g/dL (ref 6.0–8.5)
eGFR: 82 mL/min/1.73 (ref >59)

## 2024-08-29 ENCOUNTER — Ambulatory Visit: Payer: Self-pay | Admitting: Nurse Practitioner

## 2024-08-29 NOTE — Progress Notes (Signed)
 Contacted via MyChart  Good day Gertude, your labs have returned and overall remain stable. No medication changes needed at this time, but I would like to check levels fasting next visit as I would like LDL, bad cholesterol, <70 and we may need to adjust some medications to get there. Any questions? Keep being amazing!!  Thank you for allowing me to participate in your care.  I appreciate you. Kindest regards, Graci Hulce

## 2024-10-16 ENCOUNTER — Other Ambulatory Visit: Payer: Self-pay | Admitting: Nurse Practitioner

## 2024-10-16 NOTE — Telephone Encounter (Signed)
 Requested by interface surescripts. Future visit 03/04/24.  Requested Prescriptions  Pending Prescriptions Disp Refills   baclofen  (LIORESAL ) 10 MG tablet [Pharmacy Med Name: BACLOFEN  10 MG TABLET] 45 tablet 1    Sig: TAKE 1/2 TABLET BY MOUTH EVERY DAY AT BEDTIME     Analgesics:  Muscle Relaxants - baclofen  Passed - 10/16/2024 12:28 PM      Passed - Cr in normal range and within 180 days    Creatinine, Ser  Date Value Ref Range Status  08/27/2024 0.77 0.57 - 1.00 mg/dL Final         Passed - eGFR is 30 or above and within 180 days    GFR calc Af Amer  Date Value Ref Range Status  10/29/2020 71 >59 mL/min/1.73 Final    Comment:    **In accordance with recommendations from the NKF-ASN Task force,**   [facility] is in the process of updating its eGFR calculation to the   2021 CKD-EPI creatinine equation that estimates kidney function   without a race variable.    GFR calc non Af Amer  Date Value Ref Range Status  10/29/2020 62 >59 mL/min/1.73 Final   eGFR  Date Value Ref Range Status  08/27/2024 82 >59 mL/min/1.73 Final         Passed - Valid encounter within last 6 months    Recent Outpatient Visits           1 month ago Encounter for [employer] annual wellness exam   [place] [name] [place], Melanie T, NP   8 months ago Morbid obesity [facility])    [facility] [place], Melanie DASEN, NP

## 2025-03-04 ENCOUNTER — Encounter: Admitting: Nurse Practitioner

## 2025-08-27 ENCOUNTER — Ambulatory Visit
